# Patient Record
Sex: Male | Born: 1960 | Race: White | Hispanic: No | Marital: Married | Smoking: Current every day smoker
Health system: Southern US, Community
[De-identification: ages and names within clinical notes are randomized; demographics above are authoritative.]

## PROBLEM LIST (undated history)

## (undated) DIAGNOSIS — D696 Thrombocytopenia, unspecified: Secondary | ICD-10-CM

## (undated) DIAGNOSIS — I639 Cerebral infarction, unspecified: Secondary | ICD-10-CM

## (undated) DIAGNOSIS — E119 Type 2 diabetes mellitus without complications: Secondary | ICD-10-CM

## (undated) DIAGNOSIS — I10 Essential (primary) hypertension: Secondary | ICD-10-CM

---

## 2012-01-25 ENCOUNTER — Encounter (HOSPITAL_COMMUNITY): Payer: Self-pay | Admitting: Emergency Medicine

## 2012-01-25 ENCOUNTER — Emergency Department (HOSPITAL_COMMUNITY): Payer: PRIVATE HEALTH INSURANCE

## 2012-01-25 ENCOUNTER — Emergency Department (HOSPITAL_COMMUNITY)
Admission: EM | Admit: 2012-01-25 | Discharge: 2012-01-25 | Disposition: A | Discharge status: Home / Self Care | Payer: PRIVATE HEALTH INSURANCE | Attending: Emergency Medicine | Admitting: Emergency Medicine

## 2012-01-25 DIAGNOSIS — F17200 Nicotine dependence, unspecified, uncomplicated: Secondary | ICD-10-CM | POA: Insufficient documentation

## 2012-01-25 DIAGNOSIS — R079 Chest pain, unspecified: Secondary | ICD-10-CM

## 2012-01-25 DIAGNOSIS — R0789 Other chest pain: Secondary | ICD-10-CM | POA: Insufficient documentation

## 2012-01-25 DIAGNOSIS — IMO0001 Reserved for inherently not codable concepts without codable children: Secondary | ICD-10-CM | POA: Insufficient documentation

## 2012-01-25 LAB — CBC
HCT: 48.5 % (ref 39.0–52.0)
Hemoglobin: 17.2 g/dL — ABNORMAL HIGH (ref 13.0–17.0)
MCH: 31.1 pg (ref 26.0–34.0)
MCV: 87.7 fL (ref 78.0–100.0)
RBC: 5.53 MIL/uL (ref 4.22–5.81)
WBC: 7.1 10*3/uL (ref 4.0–10.5)

## 2012-01-25 LAB — BASIC METABOLIC PANEL
BUN: 9 mg/dL (ref 6–23)
CO2: 26 mEq/L (ref 19–32)
Calcium: 9.4 mg/dL (ref 8.4–10.5)
Chloride: 99 mEq/L (ref 96–112)
Creatinine, Ser: 0.82 mg/dL (ref 0.50–1.35)
Glucose, Bld: 201 mg/dL — ABNORMAL HIGH (ref 70–99)

## 2012-01-25 LAB — POCT I-STAT TROPONIN I: Troponin i, poc: 0 ng/mL (ref 0.00–0.08)

## 2012-01-25 MED ORDER — ASPIRIN 81 MG PO CHEW
324.0000 mg | CHEWABLE_TABLET | Freq: Once | ORAL | Status: AC
  Administered 2012-01-25: 324 mg via ORAL
  Filled 2012-01-25: qty 4

## 2012-01-25 MED ORDER — NITROGLYCERIN 0.4 MG SL SUBL: 0.4000 mg | SUBLINGUAL_TABLET | SUBLINGUAL | Status: DC | PRN

## 2012-01-25 MED ORDER — MORPHINE SULFATE 2 MG/ML IJ SOLN: 2.0000 mg | Freq: Once | INTRAMUSCULAR | Status: DC

## 2012-01-25 NOTE — ED Notes (Signed)
Patient transported to X-ray 

## 2012-01-25 NOTE — ED Notes (Signed)
Pt. Stated, I've had chest pain like a heaviness for 2 weeks. It started in the left and goes to right.

## 2012-01-25 NOTE — ED Provider Notes (Signed)
History     CSN: [id number]  Arrival date & time 01/25/12  1408   First MD Initiated Contact with Patient 01/25/12 1804     Chief Complaint  Patient presents with  . Chest Pain   HPI: Frank Luna is a 51 yo CM with history of tobacco abuse presents with chest pain. Symptoms started two weeks ago. Pain is located diffusely across chest, described as heaviness, initially non-radiating, moderate, gradual onset, no exacerbating factors. Symptoms initially only occurred at night but have become more frequent for the last week. One week ago he had an episode of left arm pain that resolved spontaneously. Symptoms last for a variable period of time. He has taken aspirin intermittently without relief of symptoms. He denies associated SOB, diaphoresis, orthopnea, LE edema, worsening cough, fever or chills. He has never had symptoms like this before. No cardiac history that he is aware of. Only known cardiac risk factors are tobacco abuse.   History reviewed. No pertinent past medical history.  History reviewed. No pertinent past surgical history.  No family history on file.  History  Substance Use Topics  . Smoking status: Current Every Day Smoker  . Smokeless tobacco: Not on file  . Alcohol Use:      Review of Systems  Constitutional: Negative for fever, chills and fatigue.  HENT: Negative for congestion, rhinorrhea and tinnitus.   Eyes: Negative for photophobia and visual disturbance.  Respiratory: Negative for cough, choking and shortness of breath.   Cardiovascular: Positive for chest pain.  Gastrointestinal: Negative for nausea, vomiting, abdominal pain and constipation.  Genitourinary: Negative for dysuria, urgency and decreased urine volume.  Musculoskeletal: Positive for myalgias (left arm pain). Negative for back pain and arthralgias.  Skin: Negative for pallor and rash.  Neurological: Negative for dizziness, speech difficulty, weakness and headaches.  Psychiatric/Behavioral:  Negative for confusion and agitation.  All other systems reviewed and are negative.   Allergies  Penicillins  Home Medications   Current Outpatient Rx  Name  Route  Sig  Dispense  Refill  . ASPIRIN 81 MG PO CHEW   Oral   Chew 81 mg by mouth daily.           BP 133/71  Pulse 84  Temp 98.1 F (36.7 C) (Oral)  Resp 16  Wt 185 lb (83.915 kg)  SpO2 98%  Physical Exam  Nursing note and vitals reviewed. Constitutional: He is oriented to person, place, and time. He appears well-developed and well-nourished. He is cooperative. No distress.  HENT:  Head: Normocephalic and atraumatic.  Mouth/Throat: Oropharynx is clear and moist and mucous membranes are normal.  Eyes: Conjunctivae normal and EOM are normal. Pupils are equal, round, and reactive to light.  Neck: Trachea normal and full passive range of motion without pain. Neck supple. No JVD present.  Cardiovascular: Normal rate, regular rhythm, S1 normal, S2 normal and normal heart sounds.  Exam reveals no decreased pulses.   Pulmonary/Chest: Effort normal and breath sounds normal. He has no decreased breath sounds.  Abdominal: Soft. Normal appearance and bowel sounds are normal. There is no tenderness.  Musculoskeletal: Normal range of motion. He exhibits no edema.  Neurological: He is alert and oriented to person, place, and time.  Skin: Skin is warm and dry.   ED Course  Procedures  Labs Reviewed  CBC - Abnormal; Notable for the following:    Hemoglobin 17.2 (*)     Platelets 100 (*)     All other components within normal  limits  BASIC METABOLIC PANEL - Abnormal; Notable for the following:    Glucose, Bld 201 (*)     All other components within normal limits  POCT I-STAT TROPONIN I  TROPONIN I   Dg Chest 2 View  01/25/2012  *RADIOLOGY REPORT*  Clinical Data: Mid chest pain, cough, chest congestion.  Smoker.  CHEST - 2 VIEW  Comparison: Report dated 07/21/2003.  Findings: Normal sized heart.  Diffuse peribronchial  thickening and accentuation of the interstitial markings.  Mild flattening of the hemidiaphragms.  Mild scoliosis.  IMPRESSION: No acute abnormality.  Changes of COPD and chronic bronchitis.   Original Report Authenticated By: Beckie Salts, M.D.    1. Chest pain    MDM   51 yo CM with history of tobacco abuse presents with chest pain. Afebrile, vital signs stable. Doubt PE as he is low risk by [facility] and PERC negative. CXR without evidence of acute abnormality to explain chest pain. Treated with 324 ASA. Initial troponin negative. ECG without acute ischemia. Felt patient was sufficiently low risk to obtain delta troponin. HEART score 3. Repeat troponin negative as well. Patient was offered admission to CDU for stress test but he declined and stated he would f/u with his PCP on Monday to have stress test arranged. Felt this was reasonable as delta troponin negative, not currently symptomatic, normal vital signs. Stressed importance of continued follow up. Strict return precautions given.   Reviewed imaging, labs and previous medical records, utilized in MDM  Discussed case with Dr. Silverio Lay  ECG: SR, rate 85, non-specific ST changes in leads III, aVF and V6. No prior for comparison.   Clinical Impression 1. Chest pain        Margie Billet, MD 01/26/12 [phone]

## 2012-01-25 NOTE — ED Notes (Signed)
[facility] resident at bedside to discuss POC, per pt request will hold PIV for now

## 2012-01-26 NOTE — ED Provider Notes (Signed)
I have supervised the resident on the management of this patient and agree with the note above. I personally interviewed and examined the patient and my addendum is below.   Frank Luna is a 51 y.o. male smoker here with chest pain. Intermittent chest pain for 2 weeks. Vitals and exam unremarkable. Trop neg x 2. CXR nl. Labs unremarkable. We offered CDU with stress test but he declined. He will f/u outpatient to get a stress test with his doctor.    Richardean Canal, MD 01/26/12 1500

## 2014-09-14 ENCOUNTER — Encounter: Payer: Self-pay | Admitting: *Deleted

## 2016-02-04 ENCOUNTER — Inpatient Hospital Stay (HOSPITAL_COMMUNITY)
Admission: EM | Admit: 2016-02-04 | Discharge: 2016-02-06 | DRG: 063 | Disposition: A | Discharge status: Home / Self Care | Payer: Medicaid Other | Attending: Neurology | Admitting: Neurology

## 2016-02-04 ENCOUNTER — Emergency Department (HOSPITAL_COMMUNITY): Payer: Medicaid Other

## 2016-02-04 ENCOUNTER — Inpatient Hospital Stay (HOSPITAL_COMMUNITY): Payer: Medicaid Other

## 2016-02-04 ENCOUNTER — Encounter (HOSPITAL_COMMUNITY): Payer: Self-pay | Admitting: Emergency Medicine

## 2016-02-04 DIAGNOSIS — E119 Type 2 diabetes mellitus without complications: Secondary | ICD-10-CM | POA: Diagnosis present

## 2016-02-04 DIAGNOSIS — F17200 Nicotine dependence, unspecified, uncomplicated: Secondary | ICD-10-CM | POA: Diagnosis not present

## 2016-02-04 DIAGNOSIS — R7303 Prediabetes: Secondary | ICD-10-CM | POA: Diagnosis present

## 2016-02-04 DIAGNOSIS — I639 Cerebral infarction, unspecified: Secondary | ICD-10-CM | POA: Diagnosis present

## 2016-02-04 DIAGNOSIS — Z8673 Personal history of transient ischemic attack (TIA), and cerebral infarction without residual deficits: Secondary | ICD-10-CM

## 2016-02-04 DIAGNOSIS — I10 Essential (primary) hypertension: Secondary | ICD-10-CM | POA: Diagnosis not present

## 2016-02-04 DIAGNOSIS — R4702 Dysphasia: Secondary | ICD-10-CM | POA: Diagnosis present

## 2016-02-04 DIAGNOSIS — E785 Hyperlipidemia, unspecified: Secondary | ICD-10-CM | POA: Diagnosis present

## 2016-02-04 DIAGNOSIS — R4189 Other symptoms and signs involving cognitive functions and awareness: Secondary | ICD-10-CM | POA: Diagnosis present

## 2016-02-04 DIAGNOSIS — I6350 Cerebral infarction due to unspecified occlusion or stenosis of unspecified cerebral artery: Secondary | ICD-10-CM | POA: Diagnosis not present

## 2016-02-04 DIAGNOSIS — R29703 NIHSS score 3: Secondary | ICD-10-CM | POA: Diagnosis present

## 2016-02-04 DIAGNOSIS — F05 Delirium due to known physiological condition: Secondary | ICD-10-CM

## 2016-02-04 DIAGNOSIS — R4701 Aphasia: Secondary | ICD-10-CM | POA: Diagnosis present

## 2016-02-04 DIAGNOSIS — Z6831 Body mass index (BMI) 31.0-31.9, adult: Secondary | ICD-10-CM | POA: Diagnosis not present

## 2016-02-04 DIAGNOSIS — I63512 Cerebral infarction due to unspecified occlusion or stenosis of left middle cerebral artery: Principal | ICD-10-CM | POA: Diagnosis present

## 2016-02-04 DIAGNOSIS — R479 Unspecified speech disturbances: Secondary | ICD-10-CM | POA: Diagnosis present

## 2016-02-04 DIAGNOSIS — F17210 Nicotine dependence, cigarettes, uncomplicated: Secondary | ICD-10-CM | POA: Diagnosis present

## 2016-02-04 DIAGNOSIS — G4733 Obstructive sleep apnea (adult) (pediatric): Secondary | ICD-10-CM | POA: Diagnosis present

## 2016-02-04 DIAGNOSIS — R4789 Other speech disturbances: Secondary | ICD-10-CM | POA: Diagnosis not present

## 2016-02-04 HISTORY — DX: Essential (primary) hypertension: I10

## 2016-02-04 HISTORY — DX: Cerebral infarction, unspecified: I63.9

## 2016-02-04 LAB — I-STAT CHEM 8, ED
BUN: 13 mg/dL (ref 6–20)
CREATININE: 0.9 mg/dL (ref 0.61–1.24)
Calcium, Ion: 1.19 mmol/L (ref 1.15–1.40)
Chloride: 102 mmol/L (ref 101–111)
GLUCOSE: 106 mg/dL — AB (ref 65–99)
HEMATOCRIT: 49 % (ref 39.0–52.0)
HEMOGLOBIN: 16.7 g/dL (ref 13.0–17.0)
Potassium: 4.4 mmol/L (ref 3.5–5.1)
Sodium: 139 mmol/L (ref 135–145)
TCO2: 28 mmol/L (ref 0–100)

## 2016-02-04 LAB — PROTIME-INR
INR: 0.98
Prothrombin Time: 13 seconds (ref 11.4–15.2)

## 2016-02-04 LAB — APTT: aPTT: 30 seconds (ref 24–36)

## 2016-02-04 LAB — CBC
HEMATOCRIT: 48.8 % (ref 39.0–52.0)
HEMOGLOBIN: 16.9 g/dL (ref 13.0–17.0)
MCH: 30.7 pg (ref 26.0–34.0)
MCHC: 34.6 g/dL (ref 30.0–36.0)
MCV: 88.7 fL (ref 78.0–100.0)
Platelets: 99 10*3/uL — ABNORMAL LOW (ref 150–400)
RBC: 5.5 MIL/uL (ref 4.22–5.81)
RDW: 12.5 % (ref 11.5–15.5)
WBC: 7.2 10*3/uL (ref 4.0–10.5)

## 2016-02-04 LAB — COMPREHENSIVE METABOLIC PANEL
ALT: 34 U/L (ref 17–63)
ANION GAP: 9 (ref 5–15)
AST: 20 U/L (ref 15–41)
Albumin: 4.5 g/dL (ref 3.5–5.0)
Alkaline Phosphatase: 118 U/L (ref 38–126)
BILIRUBIN TOTAL: 0.5 mg/dL (ref 0.3–1.2)
BUN: 12 mg/dL (ref 6–20)
CALCIUM: 9.7 mg/dL (ref 8.9–10.3)
CO2: 27 mmol/L (ref 22–32)
Chloride: 101 mmol/L (ref 101–111)
Creatinine, Ser: 1.01 mg/dL (ref 0.61–1.24)
GFR calc Af Amer: >60 mL/min (ref >60)
Glucose, Bld: 104 mg/dL — ABNORMAL HIGH (ref 65–99)
Potassium: 4 mmol/L (ref 3.5–5.1)
Sodium: 137 mmol/L (ref 135–145)
Total Protein: 7.8 g/dL (ref 6.5–8.1)

## 2016-02-04 LAB — DIFFERENTIAL
Basophils Absolute: 0 10*3/uL (ref 0.0–0.1)
Basophils Relative: 0 %
EOS ABS: 0.3 10*3/uL (ref 0.0–0.7)
EOS PCT: 4 %
LYMPHS ABS: 2.4 10*3/uL (ref 0.7–4.0)
Lymphocytes Relative: 33 %
MONO ABS: 0.6 10*3/uL (ref 0.1–1.0)
MONOS PCT: 9 %
Neutro Abs: 3.9 10*3/uL (ref 1.7–7.7)
Neutrophils Relative %: 54 %

## 2016-02-04 LAB — CBG MONITORING, ED: GLUCOSE-CAPILLARY: 103 mg/dL — AB (ref 65–99)

## 2016-02-04 LAB — ETHANOL: Alcohol, Ethyl (B): <5 mg/dL (ref <5)

## 2016-02-04 LAB — I-STAT TROPONIN, ED: TROPONIN I, POC: 0 ng/mL (ref 0.00–0.08)

## 2016-02-04 LAB — MRSA PCR SCREENING: MRSA by PCR: NEGATIVE

## 2016-02-04 MED ORDER — LABETALOL HCL 5 MG/ML IV SOLN
20.0000 mg | Freq: Once | INTRAVENOUS | Status: AC
  Administered 2016-02-04: 10 mg via INTRAVENOUS

## 2016-02-04 MED ORDER — PANTOPRAZOLE SODIUM 40 MG IV SOLR
40.0000 mg | Freq: Every day | INTRAVENOUS | Status: DC
  Administered 2016-02-04: 40 mg via INTRAVENOUS
  Filled 2016-02-04: qty 40

## 2016-02-04 MED ORDER — ACETAMINOPHEN 325 MG PO TABS
650.0000 mg | ORAL_TABLET | ORAL | Status: DC | PRN
  Administered 2016-02-04: 650 mg via ORAL
  Filled 2016-02-04 (×2): qty 2

## 2016-02-04 MED ORDER — ACETAMINOPHEN 650 MG RE SUPP
650.0000 mg | RECTAL | Status: DC | PRN
  Filled 2016-02-04: qty 1

## 2016-02-04 MED ORDER — LABETALOL HCL 5 MG/ML IV SOLN
INTRAVENOUS | Status: AC
  Filled 2016-02-04: qty 4

## 2016-02-04 MED ORDER — ACETAMINOPHEN 160 MG/5ML PO SOLN: 650.0000 mg | ORAL | Status: DC | PRN

## 2016-02-04 MED ORDER — ALTEPLASE (STROKE) FULL DOSE INFUSION
0.9000 mg/kg | Freq: Once | INTRAVENOUS | Status: AC
  Administered 2016-02-04: 75.5 mg via INTRAVENOUS
  Filled 2016-02-04: qty 100

## 2016-02-04 MED ORDER — SODIUM CHLORIDE 0.9 % IV SOLN
50.0000 mL | Freq: Once | INTRAVENOUS | Status: AC
  Administered 2016-02-04: 90 mL via INTRAVENOUS

## 2016-02-04 MED ORDER — SODIUM CHLORIDE 0.9 % IV SOLN
INTRAVENOUS | Status: DC
  Administered 2016-02-04 – 2016-02-05 (×2): via INTRAVENOUS

## 2016-02-04 MED ORDER — NICARDIPINE HCL IN NACL 20-0.86 MG/200ML-% IV SOLN: 5.0000 mg/h | INTRAVENOUS | Status: DC

## 2016-02-04 MED ORDER — ALTEPLASE 100 MG IV SOLR
INTRAVENOUS | Status: AC
  Filled 2016-02-04: qty 100

## 2016-02-04 MED ORDER — IOPAMIDOL (ISOVUE-370) INJECTION 76%
100.0000 mL | Freq: Once | INTRAVENOUS | Status: AC | PRN
  Administered 2016-02-04: 100 mL via INTRAVENOUS

## 2016-02-04 MED ORDER — STROKE: EARLY STAGES OF RECOVERY BOOK
Freq: Once | Status: AC
  Administered 2016-02-04: 18:00:00
  Filled 2016-02-04: qty 1

## 2016-02-04 MED ORDER — SENNOSIDES-DOCUSATE SODIUM 8.6-50 MG PO TABS: 1.0000 | ORAL_TABLET | Freq: Every evening | ORAL | Status: DC | PRN

## 2016-02-04 NOTE — Consult Note (Signed)
Referring Physician:  Dr Alvino Chapel at [facility]   Chief Complaint:  Stroke s/p iv thrombolysis  HPI:                                                                                                                                         Frank Luna is an 55 y.o. 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 Patient initially presented at [facility] due to acute onset language impairment and confusion. He was with his wife at the store and was noted to have difficulty expressing himself. Had NIHSS 3, CT head without acute abnormality, was considered a suitable candidate for treatment with iv thrombolysis and managed accordingly. A subsequent CTA brain showed no LVO. Upon arrival to the NICU he was still with slight dysphasia, NIHSS 1. Presently, denies HA, vertigo, double vision, difficulty swallowing, focal weakness or numbness, or visual disturbances.   Date last known well: 02/04/16 Time last known well: 1330 tPA Given: yes NIHSS: 3 MRS: 0  Past Medical History:  Diagnosis Date  . Hypertension   . Stroke [facility])     No past surgical history on file.  No family history on file. Social History:  reports that he has been smoking.  He has never used smokeless tobacco. He reports that he does not drink alcohol or use drugs.  Allergies:  Allergies  Allergen Reactions  . Penicillins     Unknown    Medications:                                                                                                                           I have reviewed the patient's current medications.  ROS:  History obtained from chart review and the patient  General ROS: negative for -  chills, fatigue, fever, night sweats, weight gain or weight loss Psychological ROS: negative for - behavioral disorder, hallucinations, memory difficulties, mood swings or suicidal ideation Ophthalmic ROS: negative for - blurry vision, double vision, eye pain or loss of vision ENT ROS: negative for - epistaxis, nasal discharge, oral lesions, sore throat, tinnitus or vertigo Allergy and Immunology ROS: negative for - hives or itchy/watery eyes Hematological and Lymphatic ROS: negative for - bleeding problems, bruising or swollen lymph nodes Endocrine ROS: negative for - galactorrhea, hair pattern changes, polydipsia/polyuria or temperature intolerance Respiratory ROS: negative for - cough, hemoptysis, shortness of breath or wheezing Cardiovascular ROS: negative for - chest pain, dyspnea on exertion, edema or irregular heartbeat Gastrointestinal ROS: negative for - abdominal pain, diarrhea, hematemesis, nausea/vomiting or stool incontinence Genito-Urinary ROS: negative for - dysuria, hematuria, incontinence or urinary frequency/urgency Musculoskeletal ROS: negative for - joint swelling or muscular weakness Neurological ROS: as noted in HPI Dermatological ROS: negative for rash and skin lesion changes    Physical exam: Blood pressure (!) 173/102, pulse 70, temperature 98.3 F (36.8 C), temperature source Oral, resp. rate 13, height _0  (1.778 m), weight 83.9 kg (185 lb), SpO2 96 %. Constitutional: well developed, pleasant male in no apparent distress. Eyes: no jaundice or exophthalmos.  Head: normocephalic. Neck: supple, no bruits, no JVD. Cardiac: no murmurs. Lungs: clear. Abdomen: soft, no tender, no mass. Extremities: no edema, clubbing, or cyanosis.  Skin: no rash Neurologic Examination:                                                                                                      General: NAD Mental Status: Alert, oriented, thought content appropriate. Mild expressive  dysphasia. Able to follow 3 step commands without difficulty. Cranial Nerves: II:  Visual fields grossly normal, pupils equal, round, reactive to light and accommodation III,IV, VI: ptosis not present, extra-ocular motions intact bilaterally V,VII: smile symmetric, facial light touch sensation normal bilaterally VIII: hearing normal bilaterally IX,X: uvula rises symmetrically XI: bilateral shoulder shrug XII: midline tongue extension without atrophy or fasciculations  Motor: Right : Upper extremity   5/5    Left:     Upper extremity   5/5  Lower extremity   5/5     Lower extremity   5/5 Tone and bulk:normal tone throughout; no atrophy noted Sensory: Pinprick and light touch intact throughout, bilaterally Deep Tendon Reflexes:  Right: Upper Extremity   Left: Upper extremity   biceps (C-5 to C-6) 2/4   biceps (C-5 to C-6) 2/4 tricep (C7) 2/4    triceps (C7) 2/4 Brachioradialis (C6) 2/4  Brachioradialis (C6) 2/4  Lower Extremity Lower Extremity  quadriceps (L-2 to L-4) 2/4   quadriceps (L-2 to L-4) 2/4 Achilles (S1) 2/4   Achilles (S1) 2/4  Plantars: Right: downgoing   Left: downgoing Cerebellar: normal finger-to-nose,  normal heel-to-shin test Gait:  No tested due to multiple leads    Results for orders placed or performed during the hospital encounter of 02/04/16 (from  the past 48 hour(s))  CBG monitoring, ED     Status: Abnormal   Collection Time: 02/04/16  2:27 PM  Result Value Ref Range   Glucose-Capillary 103 (H) 65 - 99 mg/dL  Ethanol     Status: None   Collection Time: 02/04/16  2:29 PM  Result Value Ref Range   Alcohol, Ethyl (B) <5 <5 mg/dL    Comment:        LOWEST DETECTABLE LIMIT FOR SERUM ALCOHOL IS 5 mg/dL FOR MEDICAL PURPOSES ONLY   Protime-INR     Status: None   Collection Time: 02/04/16  2:29 PM  Result Value Ref Range   Prothrombin Time 13.0 11.4 - 15.2 seconds   INR 0.98   APTT     Status: None   Collection Time: 02/04/16  2:29 PM  Result Value  Ref Range   aPTT 30 24 - 36 seconds  CBC     Status: Abnormal   Collection Time: 02/04/16  2:29 PM  Result Value Ref Range   WBC 7.2 4.0 - 10.5 K/uL   RBC 5.50 4.22 - 5.81 MIL/uL   Hemoglobin 16.9 13.0 - 17.0 g/dL   HCT 48.8 39.0 - 52.0 %   MCV 88.7 78.0 - 100.0 fL   MCH 30.7 26.0 - 34.0 pg   MCHC 34.6 30.0 - 36.0 g/dL   RDW 12.5 11.5 - 15.5 %   Platelets 99 (L) 150 - 400 K/uL    Comment: SPECIMEN CHECKED FOR CLOTS PLATELET COUNT CONFIRMED BY SMEAR   Differential     Status: None   Collection Time: 02/04/16  2:29 PM  Result Value Ref Range   Neutrophils Relative % 54 %   Neutro Abs 3.9 1.7 - 7.7 K/uL   Lymphocytes Relative 33 %   Lymphs Abs 2.4 0.7 - 4.0 K/uL   Monocytes Relative 9 %   Monocytes Absolute 0.6 0.1 - 1.0 K/uL   Eosinophils Relative 4 %   Eosinophils Absolute 0.3 0.0 - 0.7 K/uL   Basophils Relative 0 %   Basophils Absolute 0.0 0.0 - 0.1 K/uL  Comprehensive metabolic panel     Status: Abnormal   Collection Time: 02/04/16  2:29 PM  Result Value Ref Range   Sodium 137 135 - 145 mmol/L   Potassium 4.0 3.5 - 5.1 mmol/L   Chloride 101 101 - 111 mmol/L   CO2 27 22 - 32 mmol/L   Glucose, Bld 104 (H) 65 - 99 mg/dL   BUN 12 6 - 20 mg/dL   Creatinine, Ser 1.01 0.61 - 1.24 mg/dL   Calcium 9.7 8.9 - 10.3 mg/dL   Total Protein 7.8 6.5 - 8.1 g/dL   Albumin 4.5 3.5 - 5.0 g/dL   AST 20 15 - 41 U/L   ALT 34 17 - 63 U/L   Alkaline Phosphatase 118 38 - 126 U/L   Total Bilirubin 0.5 0.3 - 1.2 mg/dL   GFR calc non Af Amer >60 >60 mL/min   GFR calc Af Amer >60 >60 mL/min    Comment: (NOTE) The eGFR has been calculated using the CKD EPI equation. This calculation has not been validated in all clinical situations. eGFR's persistently <60 mL/min signify possible Chronic Kidney Disease.    Anion gap 9 5 - 15  I-stat troponin, ED (not at [facility], [facility])     Status: None   Collection Time: 02/04/16  2:35 PM  Result Value Ref Range   Troponin i, poc 0.00 0.00 - 0.08 ng/mL  Comment 3            Comment: Due to the release kinetics of cTnI, a negative result within the first hours of the onset of symptoms does not rule out myocardial infarction with certainty. If myocardial infarction is still suspected, repeat the test at appropriate intervals.   I-Stat Chem 8, ED  (not at [facility], [facility])     Status: Abnormal   Collection Time: 02/04/16  2:37 PM  Result Value Ref Range   Sodium 139 135 - 145 mmol/L   Potassium 4.4 3.5 - 5.1 mmol/L   Chloride 102 101 - 111 mmol/L   BUN 13 6 - 20 mg/dL   Creatinine, Ser 0.90 0.61 - 1.24 mg/dL   Glucose, Bld 106 (H) 65 - 99 mg/dL   Calcium, Ion 1.19 1.15 - 1.40 mmol/L   TCO2 28 0 - 100 mmol/L   Hemoglobin 16.7 13.0 - 17.0 g/dL   HCT 49.0 39.0 - 52.0 %   Ct Angio Head W Or Wo Contrast  Result Date: 02/04/2016 CLINICAL DATA:  Aphasia.  Sudden onset confusion. EXAM: CT ANGIOGRAPHY HEAD AND NECK TECHNIQUE: Multidetector CT imaging of the head and neck was performed using the standard protocol during bolus administration of intravenous contrast. Multiplanar CT image reconstructions and MIPs were obtained to evaluate the vascular anatomy. Carotid stenosis measurements (when applicable) are obtained utilizing NASCET criteria, using the distal internal carotid diameter as the denominator. CONTRAST:  100 mL Isovue 370 COMPARISON:  None. FINDINGS: CTA NECK FINDINGS Aortic arch: Standard branching. Arch vessel origins are widely patent. Right carotid system: Patent without evidence of stenosis or dissection. Mild calcified and noncalcified plaque about the carotid bifurcation. Left carotid system: Patent without evidence of stenosis or dissection. Mild calcified and noncalcified plaque about the carotid bifurcation. Vertebral arteries: Patent with both vertebral arteries being somewhat small in size. No evidence of stenosis or dissection. Skeleton: Mild-to-moderate C5-6 and C6-7 disc degeneration. Other neck: Bilateral mastoid effusions. Mild  mucosal thickening and small volume secretions in the right maxillary sinus, with mild scattered mucosal thickening in the paranasal sinuses elsewhere. Upper chest: Clear lung apices. Review of the MIP images confirms the above findings CTA HEAD FINDINGS Anterior circulation: The internal carotid arteries are patent from skullbase to carotid termini with mild siphon atherosclerosis but no significant stenosis. MCAs and ACAs are patent with moderate branch vessel atherosclerotic type changes but no evidence of major branch occlusion or significant proximal stenosis. There are mild-to-moderate right and moderate left mid A2 segment stenoses. No aneurysm. Posterior circulation: The right vertebral artery ends in PICA. The left vertebral artery supplies the proximal basilar. AICA origins are grossly patent. 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 SCA origins are patent. There is a patent left posterior communicating artery. PCAs are patent with moderate branch vessel irregularity but no significant proximal stenosis. No aneurysm. Venous sinuses: Patent. Anatomic variants: Persistent right trigeminal artery. Delayed phase: No abnormal enhancement. Review of the MIP images confirms the above findings IMPRESSION: 1. No major intracranial vessel occlusion or flow limiting proximal stenosis. Moderate branch vessel atherosclerotic changes. 2. Persistent right trigeminal artery, a developmental variant. 3. Mild cervical carotid artery atherosclerosis without stenosis. Electronically Signed   By: Logan Bores M.D.   On: 02/04/2016 16:09   Ct Angio Neck W And/or Wo Contrast  Result Date: 02/04/2016 CLINICAL  DATA:  Aphasia.  Sudden onset confusion. EXAM: CT ANGIOGRAPHY HEAD AND NECK TECHNIQUE: Multidetector CT imaging of the head and neck was performed using the standard  protocol during bolus administration of intravenous contrast. Multiplanar CT image reconstructions and MIPs were obtained to evaluate the vascular anatomy. Carotid stenosis measurements (when applicable) are obtained utilizing NASCET criteria, using the distal internal carotid diameter as the denominator. CONTRAST:  100 mL Isovue 370 COMPARISON:  None. FINDINGS: CTA NECK FINDINGS Aortic arch: Standard branching. Arch vessel origins are widely patent. Right carotid system: Patent without evidence of stenosis or dissection. Mild calcified and noncalcified plaque about the carotid bifurcation. Left carotid system: Patent without evidence of stenosis or dissection. Mild calcified and noncalcified plaque about the carotid bifurcation. Vertebral arteries: Patent with both vertebral arteries being somewhat small in size. No evidence of stenosis or dissection. Skeleton: Mild-to-moderate C5-6 and C6-7 disc degeneration. Other neck: Bilateral mastoid effusions. Mild mucosal thickening and small volume secretions in the right maxillary sinus, with mild scattered mucosal thickening in the paranasal sinuses elsewhere. Upper chest: Clear lung apices. Review of the MIP images confirms the above findings CTA HEAD FINDINGS Anterior circulation: The internal carotid arteries are patent from skullbase to carotid termini with mild siphon atherosclerosis but no significant stenosis. MCAs and ACAs are patent with moderate branch vessel atherosclerotic type changes but no evidence of major branch occlusion or significant proximal stenosis. There are mild-to-moderate right and moderate left mid A2 segment stenoses. No aneurysm. Posterior circulation: The right vertebral artery ends in PICA. The left vertebral artery supplies the proximal basilar. AICA origins are grossly patent. 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 SCA origins are patent. There is a patent left posterior communicating artery. PCAs are patent with moderate branch vessel irregularity but no significant proximal stenosis. No aneurysm. Venous sinuses: Patent. Anatomic variants: Persistent right trigeminal artery. Delayed phase: No abnormal enhancement. Review of the MIP images confirms the above findings IMPRESSION: 1. No major intracranial vessel occlusion or flow limiting proximal stenosis. Moderate branch vessel atherosclerotic changes. 2. Persistent right trigeminal artery, a developmental variant. 3. Mild cervical carotid artery atherosclerosis without stenosis. Electronically Signed   By: Logan Bores M.D.   On: 02/04/2016 16:09   Ct Head Code Stroke W/o Cm  Result Date: 02/04/2016 CLINICAL DATA:  Code stroke. Sudden onset of confusion and speech disturbance, 1 hour duration. EXAM: CT HEAD WITHOUT CONTRAST TECHNIQUE: Contiguous axial images were obtained from the base of the skull through the vertex without intravenous contrast. COMPARISON:  None. FINDINGS: Brain: Mild generalized brain atrophy. Chronic small-vessel changes of the cerebral hemispheric white matter. Old lacunar infarctions left basal ganglia. No sign of acute brain infarction by CT at this time. No mass lesion, hemorrhage, hydrocephalus or extra-axial collection. Vascular: Questionable hyperdense left MCA branches in the insular region. Focal calcified plaque at the supraclinoid ICA on the left. Skull: Negative Sinuses/Orbits: Mucosal thickening of the maxillary sinuses right more than left. Small mastoid effusions. Other: None significant ASPECTS ([place] Stroke Program Early CT Score) - Ganglionic level infarction (caudate, lentiform nuclei, internal capsule, insula, M1-M3 cortex): 7 - Supraganglionic infarction (M4-M6 cortex): 3 Total score (0-10 with 10 being normal): 10 IMPRESSION: 1. Atrophy and chronic small-vessel ischemic changes including old  lacunar infarctions left basal ganglia. Question few hyperdense left MCA branches in the insular region, not definite.  No parenchymal findings of acute infarction. No hemorrhage. 2. ASPECTS is 10 These results were called by telephone at the time of interpretation on 02/04/2016 at 2:45 pm to Dr. Davonna Belling , who verbally acknowledged these results. Electronically Signed   By: Nelson Chimes M.D.   On: 02/04/2016 14:49     Assessment: 55 y.o. male with acute motor dysphasia treated with iv alteplase. CTA brain showed no LVO. Likely small acute infarction distal branches left MCA. Initial NIHSS 3, with current NIHSS 1 post thrombolysis.  Admitted to NICU. Follow post iv tPA protocol. Stroke team will resume care tomorrow.  Stroke Risk Factors - hypertension, smoking and stroke  Plan: 1. HgbA1c, fasting lipid panel 2. MRI of the brain without contrast 3. Echocardiogram 4. Carotid dopplers 5. Prophylactic therapy-aspirin as per post iv tPA protocol 6. Risk factor modification 7. Telemetry monitorinG 8. Frequent neuro checks 9. PT/OT SLP  Dorian Pod, MD [facility] [phone]  02/04/2016, 5:09 PM

## 2016-02-04 NOTE — Progress Notes (Signed)
Patient arrived to unit from [facility] via EMS and [facility] RN. Patient exhibited slurred speech and moderate aphasia. Dr. Cyril Mourningamillo notified immediately and arrived to bedside. Will continue to monitor.

## 2016-02-04 NOTE — ED Notes (Signed)
Per neurologist total ateplase  75.5 mg 7.6 bolus over 1 min 67.9 over 1 hour Waste 24.5

## 2016-02-04 NOTE — ED Notes (Signed)
cbg of 103.

## 2016-02-04 NOTE — Progress Notes (Signed)
17:15 Patients aphasia worsened. MD paged, responded 17:30 and stat CT head ordered.  Patient has wax and waned with aphasia. Patient also reported h/a. Wife states pt has had h/a x 3 days. Tylenol was given. MD aware. CT done.

## 2016-02-04 NOTE — ED Provider Notes (Signed)
[facility]-EMERGENCY DEPT Provider Note   CSN: [id number] Arrival date & time: 02/04/16  1419     History   Chief Complaint Chief Complaint  Patient presents with  . Code Stroke  Level V caveat due to mild confusion and difficulty speaking.HPI Frank Luna is a 55 y.o. male.  HPI Patient presented with onset of difficulty speaking at around 1:30 day. Was driving with wife and began to not be able to get words out. Previous history of a stroke. Had had some mild improvement still having symptoms. No headache. Maybe had some difficulty using his right side also. He has had no bleeding. Previous history of strokes was reportedly nonischemic. Code stroke called upon initial evaluation.   Past Medical History:  Diagnosis Date  . Hypertension   . Stroke [facility])     Patient Active Problem List   Diagnosis Date Noted  . Acute ischemic stroke (HCC) 02/04/2016    No past surgical history on file.     Home Medications    Prior to Admission medications   Medication Sig Start Date End Date Taking? Authorizing Provider  aspirin 81 MG chewable tablet Chew 81 mg by mouth daily.    Historical Provider, MD    Family History No family history on file.  Social History Social History  Substance Use Topics  . Smoking status: Current Every Day Smoker  . Smokeless tobacco: Never Used  . Alcohol use No     Allergies   Penicillins   Review of Systems Review of Systems  Unable to perform ROS: Mental status change     Physical Exam Updated Vital Signs BP 173/80 (BP Location: Right Arm)   Pulse 77   Temp 98.3 F (36.8 C) (Oral)   Resp 12   Ht 5\' 10"  (1.778 m)   Wt 185 lb (83.9 kg)   SpO2 100%   BMI 26.54 kg/m   Physical Exam  Constitutional: He appears well-developed.  HENT:  Head: Atraumatic.  Eyes: EOM are normal.  Neck: Neck supple.  Cardiovascular: Normal rate.   Pulmonary/Chest: Effort normal.  Abdominal: Soft.  Musculoskeletal: He exhibits edema.    Neurological: He is alert.  Some difficulty speaking. Some weakness on right hand grip compared to left. Decreased plantar flexion on right foot compared to left. Mild right-sided facial droop.  . Eye movements intact. Some mild confusion. When asked to touch his nose and races hand he touched the side of his cheek and did not raise his hand but he did repeat "touched my nose and raise my hand." Complete NIH scoring done by specialist on-call neurology and was 9  Skin: Skin is warm. Capillary refill takes less than 2 seconds.     ED Treatments / Results  Labs (all labs ordered are listed, but only abnormal results are displayed) Labs Reviewed  CBC - Abnormal; Notable for the following:       Result Value   Platelets 99 (*)    All other components within normal limits  COMPREHENSIVE METABOLIC PANEL - Abnormal; Notable for the following:    Glucose, Bld 104 (*)    All other components within normal limits  I-STAT CHEM 8, ED - Abnormal; Notable for the following:    Glucose, Bld 106 (*)    All other components within normal limits  CBG MONITORING, ED - Abnormal; Notable for the following:    Glucose-Capillary 103 (*)    All other components within normal limits  ETHANOL  PROTIME-INR  APTT  DIFFERENTIAL  RAPID URINE DRUG SCREEN, HOSP PERFORMED  URINALYSIS, ROUTINE W REFLEX MICROSCOPIC  I-STAT TROPOININ, ED    EKG  EKG Interpretation  Date/Time:  Sunday February 04 2016 14:27:38 EST Ventricular Rate:  70 PR Interval:    QRS Duration: 109 QT Interval:  418 QTC Calculation: 451 R Axis:   92 Text Interpretation:  Sinus rhythm Short PR interval Borderline right axis deviation Confirmed by Rubin PayorPICKERING  MD, Yamilee Harmes [phone]([id number]) on 02/04/2016 2:35:08 PM       Radiology Ct Angio Head W Or Wo Contrast  Result Date: 02/04/2016 CLINICAL DATA:  Aphasia.  Sudden onset confusion. EXAM: CT ANGIOGRAPHY HEAD AND NECK TECHNIQUE: Multidetector CT imaging of the head and neck was performed using  the standard protocol during bolus administration of intravenous contrast. Multiplanar CT image reconstructions and MIPs were obtained to evaluate the vascular anatomy. Carotid stenosis measurements (when applicable) are obtained utilizing NASCET criteria, using the distal internal carotid diameter as the denominator. CONTRAST:  100 mL Isovue 370 COMPARISON:  None. FINDINGS: CTA NECK FINDINGS Aortic arch: Standard branching. Arch vessel origins are widely patent. Right carotid system: Patent without evidence of stenosis or dissection. Mild calcified and noncalcified plaque about the carotid bifurcation. Left carotid system: Patent without evidence of stenosis or dissection. Mild calcified and noncalcified plaque about the carotid bifurcation. Vertebral arteries: Patent with both vertebral arteries being somewhat small in size. No evidence of stenosis or dissection. Skeleton: Mild-to-moderate C5-6 and C6-7 disc degeneration. Other neck: Bilateral mastoid effusions. Mild mucosal thickening and small volume secretions in the right maxillary sinus, with mild scattered mucosal thickening in the paranasal sinuses elsewhere. Upper chest: Clear lung apices. Review of the MIP images confirms the above findings CTA HEAD FINDINGS Anterior circulation: The internal carotid arteries are patent from skullbase to carotid termini with mild siphon atherosclerosis but no significant stenosis. MCAs and ACAs are patent with moderate branch vessel atherosclerotic type changes but no evidence of major branch occlusion or significant proximal stenosis. There are mild-to-moderate right and moderate left mid A2 segment stenoses. No aneurysm. Posterior circulation: The right vertebral artery ends in PICA. The left vertebral artery supplies the proximal basilar. AICA origins are grossly patent. 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 SCA origins are patent. There is a patent left posterior communicating artery. PCAs are patent with moderate branch vessel irregularity but no significant proximal stenosis. No aneurysm. Venous sinuses: Patent. Anatomic variants: Persistent right trigeminal artery. Delayed phase: No abnormal enhancement. Review of the MIP images confirms the above findings IMPRESSION: 1. No major intracranial vessel occlusion or flow limiting proximal stenosis. Moderate branch vessel atherosclerotic changes. 2. Persistent right trigeminal artery, a developmental variant. 3. Mild cervical carotid artery atherosclerosis without stenosis. Electronically Signed   By: Sebastian AcheAllen  Grady M.D.   On: 02/04/2016 16:09   Ct Angio Neck W And/or Wo Contrast  Result Date: 02/04/2016 CLINICAL DATA:  Aphasia.  Sudden onset confusion. EXAM: CT ANGIOGRAPHY HEAD AND NECK TECHNIQUE: Multidetector CT imaging of the head and neck was performed using the standard protocol during bolus administration of intravenous contrast. Multiplanar CT image reconstructions and MIPs were obtained to evaluate the vascular anatomy. Carotid stenosis measurements (when applicable) are obtained utilizing NASCET criteria, using the distal internal carotid diameter as the denominator. CONTRAST:  100 mL Isovue 370 COMPARISON:  None. FINDINGS: CTA NECK FINDINGS Aortic arch: Standard  branching. Arch vessel origins are widely patent. Right carotid system: Patent without evidence of stenosis or dissection. Mild calcified and noncalcified plaque about the carotid bifurcation. Left carotid system: Patent without evidence of stenosis or dissection. Mild calcified and noncalcified plaque about the carotid bifurcation. Vertebral arteries: Patent with both vertebral arteries being somewhat small in size. No evidence of stenosis or dissection. Skeleton: Mild-to-moderate C5-6 and C6-7 disc degeneration. Other neck: Bilateral mastoid  effusions. Mild mucosal thickening and small volume secretions in the right maxillary sinus, with mild scattered mucosal thickening in the paranasal sinuses elsewhere. Upper chest: Clear lung apices. Review of the MIP images confirms the above findings CTA HEAD FINDINGS Anterior circulation: The internal carotid arteries are patent from skullbase to carotid termini with mild siphon atherosclerosis but no significant stenosis. MCAs and ACAs are patent with moderate branch vessel atherosclerotic type changes but no evidence of major branch occlusion or significant proximal stenosis. There are mild-to-moderate right and moderate left mid A2 segment stenoses. No aneurysm. Posterior circulation: The right vertebral artery ends in PICA. The left vertebral artery supplies the proximal basilar. AICA origins are grossly patent. 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 SCA origins are patent. There is a patent left posterior communicating artery. PCAs are patent with moderate branch vessel irregularity but no significant proximal stenosis. No aneurysm. Venous sinuses: Patent. Anatomic variants: Persistent right trigeminal artery. Delayed phase: No abnormal enhancement. Review of the MIP images confirms the above findings IMPRESSION: 1. No major intracranial vessel occlusion or flow limiting proximal stenosis. Moderate branch vessel atherosclerotic changes. 2. Persistent right trigeminal artery, a developmental variant. 3. Mild cervical carotid artery atherosclerosis without stenosis. Electronically Signed   By: Sebastian AcheAllen  Grady M.D.   On: 02/04/2016 16:09   Ct Head Code Stroke W/o Cm  Result Date: 02/04/2016 CLINICAL DATA:  Code stroke. Sudden onset of confusion and speech disturbance, 1 hour duration. EXAM: CT HEAD WITHOUT CONTRAST TECHNIQUE: Contiguous axial images were  obtained from the base of the skull through the vertex without intravenous contrast. COMPARISON:  None. FINDINGS: Brain: Mild generalized brain atrophy. Chronic small-vessel changes of the cerebral hemispheric white matter. Old lacunar infarctions left basal ganglia. No sign of acute brain infarction by CT at this time. No mass lesion, hemorrhage, hydrocephalus or extra-axial collection. Vascular: Questionable hyperdense left MCA branches in the insular region. Focal calcified plaque at the supraclinoid ICA on the left. Skull: Negative Sinuses/Orbits: Mucosal thickening of the maxillary sinuses right more than left. Small mastoid effusions. Other: None significant ASPECTS (Alberta Stroke Program Early CT Score) - Ganglionic level infarction (caudate, lentiform nuclei, internal capsule, insula, M1-M3 cortex): 7 - Supraganglionic infarction (M4-M6 cortex): 3 Total score (0-10 with 10 being normal): 10 IMPRESSION: 1. Atrophy and chronic small-vessel ischemic changes including old lacunar infarctions left basal ganglia. Question few hyperdense left MCA branches in the insular region, not definite. No parenchymal findings of acute infarction. No hemorrhage. 2. ASPECTS is 10 These results were called by telephone at the time of interpretation on 02/04/2016 at 2:45 pm to Dr. Benjiman CoreNATHAN Darcus Edds , who verbally acknowledged these results. Electronically Signed   By: Paulina FusiMark  Shogry M.D.   On: 02/04/2016 14:49    Procedures Procedures (including critical care time)  Medications Ordered in ED Medications  alteplase (ACTIVASE) 1 mg/mL infusion 76 mg (75.5 mg Intravenous New Bag/Given 02/04/16 1516)  Followed by  0.9 %  sodium chloride infusion (90 mLs Intravenous New Bag/Given 02/04/16 1526)  iopamidol (ISOVUE-370) 76 % injection 100 mL (100 mLs Intravenous Contrast Given 02/04/16 1543)     Initial Impression / Assessment and Plan / ED Course  I have reviewed the triage vital signs and the nursing  notes.  Pertinent labs & imaging results that were available during my care of the patient were reviewed by me and considered in my medical decision making (see chart for details).  Clinical Course     Patient with likely stroke. Specialist on-call saw and recommended TPA. Discussed with patient and his wife. Has had previous stroke a year ago. TPA was recommended and patient and his wife agreed. Discussed with Dr. Leroy Kennedy who accepted the patient transfer. NIH score of 9 with no contraindications to TPA. Blood pressure does not need emergent control. Transfer to [facility]. [facility] transfer the ER while awaiting ICU bed. Discussed with Dr. Preston Fleeting and Dr. Leroy Kennedy.  Final Clinical Impressions(s) / ED Diagnoses   Final diagnoses:  Acute ischemic stroke (HCC)   CRITICAL CARE Performed by: Billee Cashing Total critical care time: Critical care time was exclusive of separately billable procedures and treating other patients. Critical care was necessary to treat or prevent imminent or life-threatening deterioration. Critical care was time spent personally by me on the following activities: development of treatment plan with patient and/or surrogate as well as nursing, discussions with consultants, evaluation of patient's response to treatment, examination of patient, obtaining history from patient or surrogate, ordering and performing treatments and interventions, ordering and review of laboratory studies, ordering and review of radiographic studies, pulse oximetry and re-evaluation of patient's condition.  New Prescriptions New Prescriptions   No medications on file     Benjiman Core, MD 02/04/16 1623

## 2016-02-04 NOTE — ED Notes (Signed)
[facility] . Accompanied by Harmon Pier Vogler, RN

## 2016-02-04 NOTE — ED Notes (Signed)
Paged code stroke at 14:29; called SOC at 14:33

## 2016-02-04 NOTE — Progress Notes (Signed)
eLink Physician-Brief Progress Note Patient Name: Paulino RilyDanny Kreft DOB: [birth year] MRN: [id number]   Date of Service  02/04/2016  HPI/Events of Note  CVA admission, stable  eICU Interventions  Per neurology      Intervention Category Evaluation Type: New Patient Evaluation  Shan Levansatrick Wright 02/04/2016, 5:19 PM

## 2016-02-04 NOTE — ED Triage Notes (Addendum)
[id number] wife reports sudden onset confusion and trouble speaking. Pt alert to person upon ED arrival.Speech normal. Following commands. Moving all extremities x 4 equally. Denies ha at present. EDP at beside.

## 2016-02-04 NOTE — Progress Notes (Signed)
Patient vomited, MD aware. Patient now states he "feels better". No acute change in NIH. Patient continues to wax and wane with aphasia between mild/moderate vs. Severe (difficulty identifying objects on page, perseverating). Will continue to monitor.

## 2016-02-05 ENCOUNTER — Encounter (HOSPITAL_COMMUNITY): Payer: Self-pay | Admitting: Certified Registered Nurse Anesthetist

## 2016-02-05 ENCOUNTER — Inpatient Hospital Stay (HOSPITAL_COMMUNITY): Payer: Medicaid Other

## 2016-02-05 DIAGNOSIS — F17200 Nicotine dependence, unspecified, uncomplicated: Secondary | ICD-10-CM | POA: Diagnosis present

## 2016-02-05 DIAGNOSIS — F05 Delirium due to known physiological condition: Secondary | ICD-10-CM

## 2016-02-05 DIAGNOSIS — I10 Essential (primary) hypertension: Secondary | ICD-10-CM | POA: Diagnosis present

## 2016-02-05 DIAGNOSIS — R4789 Other speech disturbances: Secondary | ICD-10-CM

## 2016-02-05 DIAGNOSIS — R479 Unspecified speech disturbances: Secondary | ICD-10-CM | POA: Diagnosis present

## 2016-02-05 DIAGNOSIS — I6350 Cerebral infarction due to unspecified occlusion or stenosis of unspecified cerebral artery: Secondary | ICD-10-CM

## 2016-02-05 LAB — URINALYSIS, ROUTINE W REFLEX MICROSCOPIC
Bilirubin Urine: NEGATIVE
GLUCOSE, UA: NEGATIVE mg/dL
HGB URINE DIPSTICK: NEGATIVE
Ketones, ur: NEGATIVE mg/dL
Leukocytes, UA: NEGATIVE
Nitrite: NEGATIVE
Protein, ur: NEGATIVE mg/dL
SPECIFIC GRAVITY, URINE: 1.014 (ref 1.005–1.030)
pH: 6 (ref 5.0–8.0)

## 2016-02-05 LAB — LIPID PANEL
CHOLESTEROL: 156 mg/dL (ref 0–200)
HDL: 30 mg/dL — AB (ref >40)
LDL Cholesterol: 98 mg/dL (ref 0–99)
TRIGLYCERIDES: 138 mg/dL (ref <150)
Total CHOL/HDL Ratio: 5.2 RATIO
VLDL: 28 mg/dL (ref 0–40)

## 2016-02-05 LAB — RAPID URINE DRUG SCREEN, HOSP PERFORMED
Amphetamines: NOT DETECTED
BENZODIAZEPINES: NOT DETECTED
Barbiturates: NOT DETECTED
COCAINE: NOT DETECTED
Opiates: NOT DETECTED
Tetrahydrocannabinol: NOT DETECTED

## 2016-02-05 LAB — ECHOCARDIOGRAM COMPLETE
Height: 67 in
Weight: 3206.37 oz

## 2016-02-05 MED ORDER — ATORVASTATIN CALCIUM 40 MG PO TABS
40.0000 mg | ORAL_TABLET | Freq: Every day | ORAL | Status: DC
  Administered 2016-02-05: 40 mg via ORAL
  Filled 2016-02-05: qty 1

## 2016-02-05 MED ORDER — METOPROLOL SUCCINATE ER 50 MG PO TB24
50.0000 mg | ORAL_TABLET | Freq: Every day | ORAL | Status: DC
  Administered 2016-02-06: 50 mg via ORAL
  Filled 2016-02-05: qty 1

## 2016-02-05 MED ORDER — ENOXAPARIN SODIUM 40 MG/0.4ML ~~LOC~~ SOLN: 40.0000 mg | SUBCUTANEOUS | Status: DC

## 2016-02-05 MED ORDER — ASPIRIN EC 325 MG PO TBEC
325.0000 mg | DELAYED_RELEASE_TABLET | Freq: Every day | ORAL | Status: DC
  Administered 2016-02-05 – 2016-02-06 (×2): 325 mg via ORAL
  Filled 2016-02-05 (×2): qty 1

## 2016-02-05 MED ORDER — PANTOPRAZOLE SODIUM 40 MG PO TBEC
40.0000 mg | DELAYED_RELEASE_TABLET | Freq: Every day | ORAL | Status: DC
  Administered 2016-02-05 – 2016-02-06 (×2): 40 mg via ORAL
  Filled 2016-02-05 (×2): qty 1

## 2016-02-05 NOTE — Progress Notes (Signed)
  Echocardiogram 2D Echocardiogram has been performed.  Rendi Mapel L Androw 02/05/2016, 5:07 PM

## 2016-02-05 NOTE — Progress Notes (Signed)
OT Cancellation Note  Patient Details Name: Frank RilyDanny Luna MRN: [id number] DOB: 1960/09/17   Cancelled Treatment:    Reason Eval/Treat Not Completed: Other (comment) the patient is on strict bedrest.  Ignacia PalmaCathy Meeghan Skipper, OTR/L [phone] 02/05/2016

## 2016-02-05 NOTE — Progress Notes (Signed)
STROKE TEAM PROGRESS NOTE   HISTORY OF PRESENT ILLNESS (per record) Frank Luna is an 55 y.o. 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 Patient initially presented at [facility] due to acute onset language impairment and confusion. He was with his wife at the store and was noted to have difficulty expressing himself. Had NIHSS 3, CT head without acute abnormality, was considered a suitable candidate for treatment with iv thrombolysis and managed accordingly. A subsequent CTA brain showed no LVO. Upon arrival to the Neuro ICU he was still with slight dysphasia, NIHSS 1.   1.5 years ago patient had a reported stroke; however, he was not kept in the hospital. He reports being sent home from the ED.  Patient was administered IV t-PA secondary to concern for likely left MCA CVA. He was admitted to the neuro ICU for further evaluation and treatment.  SUBJECTIVE (INTERVAL HISTORY) Patient was seen and examined this morning. Patient reports a headache overnight, frontal, for the past 3 days. Currently, he denies headache, but he does admit to toothache. He feels that his headache comes from his tooth pain. He denies photophobia and phonophobia.   OBJECTIVE Temp:  [97.9 F (36.6 C)-99 F (37.2 C)] 98.3 F (36.8 C) (12/18 1200) Pulse Rate:  [59-95] 71 (12/18 1300) Cardiac Rhythm: Normal sinus rhythm (12/18 1200) Resp:  [12-25] 17 (12/18 1300) BP: (120-192)/(59-125) 147/73 (12/18 1300) SpO2:  [91 %-100 %] 96 % (12/18 1300) Weight:  [185 lb (83.9 kg)-208 lb 12.4 oz (94.7 kg)] 200 lb 6.4 oz (90.9 kg) (12/18 0500)  CBC:   Recent Labs Lab 02/04/16 1429 02/04/16 1437  WBC 7.2  --   NEUTROABS 3.9  --   HGB 16.9 16.7  HCT 48.8 49.0  MCV 88.7  --   PLT 99*  --     Basic Metabolic Panel:   Recent Labs Lab 02/04/16 1429  02/04/16 1437  NA 137 139  K 4.0 4.4  CL 101 102  CO2 27  --   GLUCOSE 104* 106*  BUN 12 13  CREATININE 1.01 0.90  CALCIUM 9.7  --     Lipid Panel:     Component Value Date/Time   CHOL 156 02/05/2016 0447   TRIG 138 02/05/2016 0447   HDL 30 (L) 02/05/2016 0447   CHOLHDL 5.2 02/05/2016 0447   VLDL 28 02/05/2016 0447   LDLCALC 98 02/05/2016 0447   HgbA1c: No results found for: HGBA1C Urine Drug Screen: No results found for: LABOPIA, COCAINSCRNUR, LABBENZ, AMPHETMU, THCU, LABBARB    IMAGING  Ct Angio Head W Or Wo Contrast  Result Date: 02/04/2016 CLINICAL DATA:  Aphasia.  Sudden onset confusion. EXAM: CT ANGIOGRAPHY HEAD AND NECK TECHNIQUE: Multidetector CT imaging of the head and neck was performed using the standard protocol during bolus administration of intravenous contrast. Multiplanar CT image reconstructions and MIPs were obtained to evaluate the vascular anatomy. Carotid stenosis measurements (when applicable) are obtained utilizing NASCET criteria, using the distal internal carotid diameter as the denominator. CONTRAST:  100 mL Isovue 370 COMPARISON:  None. FINDINGS: CTA NECK FINDINGS Aortic arch: Standard branching. Arch vessel origins are widely patent. Right carotid system: Patent without evidence of stenosis or dissection. Mild calcified and noncalcified plaque about the carotid bifurcation. Left carotid system: Patent without evidence of stenosis or dissection. Mild calcified and noncalcified plaque about the carotid  bifurcation. Vertebral arteries: Patent with both vertebral arteries being somewhat small in size. No evidence of stenosis or dissection. Skeleton: Mild-to-moderate C5-6 and C6-7 disc degeneration. Other neck: Bilateral mastoid effusions. Mild mucosal thickening and small volume secretions in the right maxillary sinus, with mild scattered mucosal thickening in the paranasal sinuses elsewhere. Upper chest: Clear lung apices. Review of the MIP images confirms the  above findings CTA HEAD FINDINGS Anterior circulation: The internal carotid arteries are patent from skullbase to carotid termini with mild siphon atherosclerosis but no significant stenosis. MCAs and ACAs are patent with moderate branch vessel atherosclerotic type changes but no evidence of major branch occlusion or significant proximal stenosis. There are mild-to-moderate right and moderate left mid A2 segment stenoses. No aneurysm. Posterior circulation: The right vertebral artery ends in PICA. The left vertebral artery supplies the proximal basilar. AICA origins are grossly patent. 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 SCA origins are patent. There is a patent left posterior communicating artery. PCAs are patent with moderate branch vessel irregularity but no significant proximal stenosis. No aneurysm. Venous sinuses: Patent. Anatomic variants: Persistent right trigeminal artery. Delayed phase: No abnormal enhancement. Review of the MIP images confirms the above findings IMPRESSION: 1. No major intracranial vessel occlusion or flow limiting proximal stenosis. Moderate branch vessel atherosclerotic changes. 2. Persistent right trigeminal artery, a developmental variant. 3. Mild cervical carotid artery atherosclerosis without stenosis. Electronically Signed   By: Sebastian AcheAllen  Grady M.D.   On: 02/04/2016 16:09   Ct Head Wo Contrast  Result Date: 02/04/2016 CLINICAL DATA:  Altered mental status. Status post tPA administration EXAM: CT HEAD WITHOUT CONTRAST TECHNIQUE: Contiguous axial images were obtained from the base of the skull through the vertex without intravenous contrast. COMPARISON:  Head CT and CTA 02/04/2016 FINDINGS: Brain: No mass lesion, intraparenchymal hemorrhage or extra-axial collection. No evidence of acute cortical infarct. Unchanged  appearance of chronic left caudate head lacunar infarct. There is periventricular hypoattenuation compatible with chronic microvascular disease. Vascular: No hyperdense vessel or unexpected calcification. Skull: Normal visualized skull base, calvarium and extracranial soft tissues. Sinuses/Orbits: Moderate paranasal sinus mucosal thickening without fluid levels, greatest in the right maxillary sinus. Bilateral small mastoid effusions. The orbits are normal. Normal orbits. IMPRESSION: 1. No intracranial hemorrhage. 2. Unchanged head CT without acute findings. Electronically Signed   By: Deatra RobinsonKevin  Herman M.D.   On: 02/04/2016 18:55   Ct Angio Neck W And/or Wo Contrast  Result Date: 02/04/2016 CLINICAL DATA:  Aphasia.  Sudden onset confusion. EXAM: CT ANGIOGRAPHY HEAD AND NECK TECHNIQUE: Multidetector CT imaging of the head and neck was performed using the standard protocol during bolus administration of intravenous contrast. Multiplanar CT image reconstructions and MIPs were obtained to evaluate the vascular anatomy. Carotid stenosis measurements (when applicable) are obtained utilizing NASCET criteria, using the distal internal carotid diameter as the denominator. CONTRAST:  100 mL Isovue 370 COMPARISON:  None. FINDINGS: CTA NECK FINDINGS Aortic arch: Standard branching. Arch vessel origins are widely patent. Right carotid system: Patent without evidence of stenosis or dissection. Mild calcified and noncalcified plaque about the carotid bifurcation. Left carotid system: Patent without evidence of stenosis or dissection. Mild calcified and noncalcified plaque about the carotid bifurcation. Vertebral arteries: Patent with both vertebral arteries being somewhat small in size. No evidence of stenosis or dissection. Skeleton: Mild-to-moderate C5-6 and C6-7 disc degeneration. Other neck:  Bilateral mastoid effusions. Mild mucosal thickening and small volume secretions in the right maxillary sinus, with mild scattered  mucosal thickening in the paranasal sinuses elsewhere. Upper chest: Clear lung apices. Review of the MIP images confirms the above findings CTA HEAD FINDINGS Anterior circulation: The internal carotid arteries are patent from skullbase to carotid termini with mild siphon atherosclerosis but no significant stenosis. MCAs and ACAs are patent with moderate branch vessel atherosclerotic type changes but no evidence of major branch occlusion or significant proximal stenosis. There are mild-to-moderate right and moderate left mid A2 segment stenoses. No aneurysm. Posterior circulation: The right vertebral artery ends in PICA. The left vertebral artery supplies the proximal basilar. AICA origins are grossly patent. 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 SCA origins are patent. There is a patent left posterior communicating artery. PCAs are patent with moderate branch vessel irregularity but no significant proximal stenosis. No aneurysm. Venous sinuses: Patent. Anatomic variants: Persistent right trigeminal artery. Delayed phase: No abnormal enhancement. Review of the MIP images confirms the above findings IMPRESSION: 1. No major intracranial vessel occlusion or flow limiting proximal stenosis. Moderate branch vessel atherosclerotic changes. 2. Persistent right trigeminal artery, a developmental variant. 3. Mild cervical carotid artery atherosclerosis without stenosis. Electronically Signed   By: Sebastian AcheAllen  Grady M.D.   On: 02/04/2016 16:09   Ct Head Code Stroke W/o Cm  Result Date: 02/04/2016 CLINICAL DATA:  Code stroke. Sudden onset of confusion and speech disturbance, 1 hour duration. EXAM: CT HEAD WITHOUT CONTRAST TECHNIQUE: Contiguous axial images were obtained from the base of the skull through the vertex without intravenous contrast. COMPARISON:  None. FINDINGS:  Brain: Mild generalized brain atrophy. Chronic small-vessel changes of the cerebral hemispheric white matter. Old lacunar infarctions left basal ganglia. No sign of acute brain infarction by CT at this time. No mass lesion, hemorrhage, hydrocephalus or extra-axial collection. Vascular: Questionable hyperdense left MCA branches in the insular region. Focal calcified plaque at the supraclinoid ICA on the left. Skull: Negative Sinuses/Orbits: Mucosal thickening of the maxillary sinuses right more than left. Small mastoid effusions. Other: None significant ASPECTS (Alberta Stroke Program Early CT Score) - Ganglionic level infarction (caudate, lentiform nuclei, internal capsule, insula, M1-M3 cortex): 7 - Supraganglionic infarction (M4-M6 cortex): 3 Total score (0-10 with 10 being normal): 10 IMPRESSION: 1. Atrophy and chronic small-vessel ischemic changes including old lacunar infarctions left basal ganglia. Question few hyperdense left MCA branches in the insular region, not definite. No parenchymal findings of acute infarction. No hemorrhage. 2. ASPECTS is 10 These results were called by telephone at the time of interpretation on 02/04/2016 at 2:45 pm to Dr. Benjiman CoreNATHAN PICKERING , who verbally acknowledged these results. Electronically Signed   By: Paulina FusiMark  Shogry M.D.   On: 02/04/2016 14:49   PHYSICAL EXAM Vitals:   02/05/16 1000 02/05/16 1100 02/05/16 1200 02/05/16 1300  BP: (!) 141/78 140/73 (!) 151/79 (!) 147/73  Pulse: 70 71 70 71  Resp: 16 15 19 17   Temp:   98.3 F (36.8 C)   TempSrc:   Oral   SpO2: 95% 96% 96% 96%  Weight:      Height:       General: Vital signs reviewed.  Patient is in no acute distress and cooperative with exam.  Head: Normocephalic and atraumatic. Eyes: EOMI, conjunctivae normal, no scleral icterus.  Neck: Supple, trachea midline  Cardiovascular: RRR Pulmonary/Chest: Clear to anterior auscultation bilaterally Abdominal: Soft, non-tender, non-distended, BS + Extremities: No  lower extremity edema bilaterally Skin: Warm, dry and intact.  Neurological: A&Ox3, able to spell WORLD forwards, unable to spell WORLD backwards. Unable to subtract 35 cent from one dollar. Naming intact. No evidence of aphasia. Visual fields intact. Sensation intact bilaterally. Strength is normal and symmetric bilaterally. Possible slight right facial weakness. Unable to remember 3 words after a period of time.   ASSESSMENT/PLAN Delontae Lamm is an 55 y.o. male, right handed,with a past medical history that is significant for HTN, smoking, and stroke 1.5 years ago without residual deficits transferred to [facility] after receiving tPA for likely left MCA territory infarct. Patient initially presented at [facility] due to acute onset language impairment and confusion. He was with his wife at the store and was noted to have difficulty expressing himself. Had NIHSS 3, CT head without acute abnormality, was given tPA.  Presumed Left MCA CVA  MRI  pending  2D Echo  pending  LDL 98  HgbA1c pending  SCDs for VTE prophylaxis- waiting to start until 24 hours outside of tPA administration Diet Heart Room service appropriate? Yes; Fluid consistency: Thin  No antithrombotic prior to admission, likely will start aspirin 325 mg daily if CVA confirmed  Patient counseled to be compliant with his antithrombotic medications  Ongoing aggressive stroke risk factor management  Therapy recommendations: pending; will be off strict bedrest today at 1500  Disposition:  pending  Hypertension  Stable  Permissive hypertension (OK if < 220/120) but gradually normalize in 5-7 days  Long-term BP goal normotensive  Hyperlipidemia  Home meds: None  LDL 98, goal < 70  Will wait until CVA confirmed prior to starting statin  Diabetes  HgbA1c pending, goal < 7.0  Other Stroke Risk Factors  Cigarette smoker 1.5-2 ppd, advised to stop smoking  Obesity, Body mass index is 31.39 kg/m., recommend weight loss, diet  and exercise as appropriate   Hx stroke/TIA  Hospital day # 1  Karlene Lineman, DO PGY-3 Internal Medicine Resident Pager # [phone] 02/05/2016 1:06 PM  To contact Stroke Continuity provider, please refer to [email or web address]. After hours, contact General Neurology

## 2016-02-05 NOTE — Progress Notes (Signed)
Neuro MD aware that MRI was done and resulted.

## 2016-02-05 NOTE — Progress Notes (Signed)
PT Cancellation Note  Patient Details Name: Frank Luna MRN: [id number] DOB: 10-Mar-1960   Cancelled Treatment:    Reason Eval/Treat Not Completed: Patient not medically ready.  Pt currently on strict bedrest.  Please advance activity order once appropriate for PT and mobility.     [name], PT  [phone] 02/05/2016, 8:21 AM

## 2016-02-06 DIAGNOSIS — E785 Hyperlipidemia, unspecified: Secondary | ICD-10-CM | POA: Diagnosis present

## 2016-02-06 DIAGNOSIS — R4189 Other symptoms and signs involving cognitive functions and awareness: Secondary | ICD-10-CM

## 2016-02-06 DIAGNOSIS — Z8673 Personal history of transient ischemic attack (TIA), and cerebral infarction without residual deficits: Secondary | ICD-10-CM

## 2016-02-06 DIAGNOSIS — R7303 Prediabetes: Secondary | ICD-10-CM | POA: Diagnosis present

## 2016-02-06 DIAGNOSIS — I639 Cerebral infarction, unspecified: Secondary | ICD-10-CM

## 2016-02-06 DIAGNOSIS — E119 Type 2 diabetes mellitus without complications: Secondary | ICD-10-CM | POA: Diagnosis present

## 2016-02-06 LAB — HEMOGLOBIN A1C
HEMOGLOBIN A1C: 6 % — AB (ref 4.8–5.6)
MEAN PLASMA GLUCOSE: 126 mg/dL

## 2016-02-06 MED ORDER — ASPIRIN 325 MG PO TBEC: 325.0000 mg | DELAYED_RELEASE_TABLET | Freq: Every day | ORAL | 3 refills | Status: DC

## 2016-02-06 MED ORDER — METOPROLOL SUCCINATE ER 50 MG PO TB24: 50.0000 mg | ORAL_TABLET | Freq: Every day | ORAL | 3 refills | Status: DC

## 2016-02-06 MED ORDER — ATORVASTATIN CALCIUM 40 MG PO TABS: 40.0000 mg | ORAL_TABLET | Freq: Every day | ORAL | 3 refills | Status: DC

## 2016-02-06 NOTE — Progress Notes (Signed)
OT Screen Note  Patient Details Name: Frank Luna MRN: [id number] DOB: 02/07/1961   Cancelled Treatment:    Reason Eval/Treat Not Completed: OT screened, no needs identified, will sign off  Felecia ShellingJones, Pheonix Clinkscale B   Khylei Wilms, Brynn   OTR/L Pager: [phone] Office: [phone] .  02/06/2016, 9:46 AM

## 2016-02-06 NOTE — Care Management Note (Signed)
Case Management Note  Patient Details  Name: Frank Luna MRN: [id number] Date of Birth: 1960/05/03  Subjective/Objective: Pt admitted on 02/04/16 with acute onset language impairment and confusion.  MRI showed small acute infarct in the LT corona radiata.  PTA, pt independent, lives with spouse.                   Action/Plan: Plan dc home today with spouse.  PT recommending no OP follow up.  No dc needs identified.    Expected Discharge Date:     02/06/2016             Expected Discharge Plan:  Home/Self Care  In-House Referral:     Discharge planning Services  CM Consult  Post Acute Care Choice:    Choice offered to:     DME Arranged:    DME Agency:     HH Arranged:    HH Agency:     Status of Service:  Completed, signed off  If discussed at [employer] Length of Stay Meetings, dates discussed:    Additional Comments:  Glennon Macmerson, Glenden Rossell M, RN 02/06/2016, 11:41 AM

## 2016-02-06 NOTE — Discharge Summary (Signed)
Stroke Discharge Summary  Patient ID: Frank Luna   MRN: [id number]      DOB: May 28, 1960  Date of Admission: 02/04/2016 Date of Discharge: 02/06/2016  Attending Physician:  Marvel Plan, MD, Stroke MD Consulting Physician(s):    None  Patient's PCP:  Graylin Shiver [place]  DISCHARGE DIAGNOSIS: Acute Left Corona Radiata CVA Principal Problem:   Acute ischemic stroke [facility]) Active Problems:   Speech abnormality   Smoker   Essential hypertension   Hyperlipidemia   Cognitive impairment   Morbid obesity (HCC)   Pre-diabetes   History of CVA (cerebrovascular accident)  BMI: Body mass index is 31.39 kg/m.  Past Medical History:  Diagnosis Date  . Hypertension   . Stroke [facility])     Allergies as of 02/06/2016      Reactions   Penicillins    Unknown reaction      Medication List    STOP taking these medications   ibuprofen 200 MG tablet Commonly known as:  ADVIL,MOTRIN     TAKE these medications   acetaminophen 500 MG tablet Commonly known as:  TYLENOL Take 1,000 mg by mouth every 6 (six) hours as needed for headache (pain).   aspirin 325 MG EC tablet Take 1 tablet (325 mg total) by mouth daily. Start taking on:  02/07/2016   atorvastatin 40 MG tablet Commonly known as:  LIPITOR Take 1 tablet (40 mg total) by mouth daily at 6 PM.   metoprolol succinate 50 MG 24 hr tablet Commonly known as:  TOPROL-XL Take 1 tablet (50 mg total) by mouth daily. Take with or immediately following a meal.       LABORATORY STUDIES CBC    Component Value Date/Time   WBC 7.2 02/04/2016 1429   RBC 5.50 02/04/2016 1429   HGB 16.7 02/04/2016 1437   HCT 49.0 02/04/2016 1437   PLT 99 (L) 02/04/2016 1429   MCV 88.7 02/04/2016 1429   MCH 30.7 02/04/2016 1429   MCHC 34.6 02/04/2016 1429   RDW 12.5 02/04/2016 1429   LYMPHSABS 2.4 02/04/2016 1429   MONOABS 0.6 02/04/2016 1429   EOSABS 0.3 02/04/2016 1429   BASOSABS 0.0 02/04/2016 1429   CMP    Component Value Date/Time    NA 139 02/04/2016 1437   K 4.4 02/04/2016 1437   CL 102 02/04/2016 1437   CO2 27 02/04/2016 1429   GLUCOSE 106 (H) 02/04/2016 1437   BUN 13 02/04/2016 1437   CREATININE 0.90 02/04/2016 1437   CALCIUM 9.7 02/04/2016 1429   PROT 7.8 02/04/2016 1429   ALBUMIN 4.5 02/04/2016 1429   AST 20 02/04/2016 1429   ALT 34 02/04/2016 1429   ALKPHOS 118 02/04/2016 1429   BILITOT 0.5 02/04/2016 1429   GFRNONAA >60 02/04/2016 1429   GFRAA >60 02/04/2016 1429   COAGS Lab Results  Component Value Date   INR 0.98 02/04/2016   Lipid Panel    Component Value Date/Time   CHOL 156 02/05/2016 0447   TRIG 138 02/05/2016 0447   HDL 30 (L) 02/05/2016 0447   CHOLHDL 5.2 02/05/2016 0447   VLDL 28 02/05/2016 0447   LDLCALC 98 02/05/2016 0447   HgbA1C  Lab Results  Component Value Date   HGBA1C 6.0 (H) 02/05/2016   Urinalysis    Component Value Date/Time   COLORURINE YELLOW 02/05/2016 1533   APPEARANCEUR CLEAR 02/05/2016 1533   LABSPEC 1.014 02/05/2016 1533   PHURINE 6.0 02/05/2016 1533   GLUCOSEU NEGATIVE 02/05/2016 1533  HGBUR NEGATIVE 02/05/2016 1533   BILIRUBINUR NEGATIVE 02/05/2016 1533   KETONESUR NEGATIVE 02/05/2016 1533   PROTEINUR NEGATIVE 02/05/2016 1533   NITRITE NEGATIVE 02/05/2016 1533   LEUKOCYTESUR NEGATIVE 02/05/2016 1533   Urine Drug Screen     Component Value Date/Time   LABOPIA NONE DETECTED 02/05/2016 1532   COCAINSCRNUR NONE DETECTED 02/05/2016 1532   LABBENZ NONE DETECTED 02/05/2016 1532   AMPHETMU NONE DETECTED 02/05/2016 1532   THCU NONE DETECTED 02/05/2016 1532   LABBARB NONE DETECTED 02/05/2016 1532    Alcohol Level    Component Value Date/Time   ETH <5 02/04/2016 1429    SIGNIFICANT DIAGNOSTIC STUDIES CT Head wo contrast: 1. Atrophy and chronic small-vessel ischemic changes including old lacunar infarctions left basal ganglia. Question few hyperdense left MCA branches in the insular region, not definite. No parenchymal findings of acute  infarction. No hemorrhage.  CTA Head Neck: 1. No major intracranial vessel occlusion or flow limiting proximal stenosis. Moderate branch vessel atherosclerotic changes. 2. Persistent right trigeminal artery, a developmental variant. 3. Mild cervical carotid artery atherosclerosis without stenosis.  CT Head wo contrast: 1. No intracranial hemorrhage. 2. Unchanged head CT without acute findings.  MRI Brain wo contrast: 1. Small acute infarct in the left corona radiata. 2. Chronic small vessel ischemic disease with multiple chronic lacunar infarcts as above.  2D Echo:  - Left ventricle: The cavity size was normal. There was mild   concentric hypertrophy. Systolic function was normal. The   estimated ejection fraction was in the range of 55% to 60%. Wall   motion was normal; there were no regional wall motion   abnormalities. Left ventricular diastolic function parameters   were normal. - Aortic valve: Trileaflet; normal thickness, mildly calcified   leaflets. - Mitral valve: Minimal calcification. - Tricuspid valve: There was trivial regurgitation. - Pulmonary arteries: Systolic pressure could not be accurately   estimated.    HISTORY OF PRESENT ILLNESS Frank Luna is an 55 y.o. 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 Patient initially presented at [facility] due to acute onset language impairment and confusion. He was with his wife at the store and was noted to have difficulty expressing himself. Had NIHSS 3, CT head without acute abnormality, was considered a suitable candidate for treatment with iv thrombolysis and managed accordingly. A subsequent CTA brain showed no LVO. Upon arrival to the NICU he was still with slight dysphasia, NIHSS 1. Presently, denies HA, vertigo, double vision,  difficulty swallowing, focal weakness or numbness, or visual disturbances.  HOSPITAL COURSE Patient was admitted to the Neuro ICU for monitoring after tPA administration. Patient did well and returned back to baseline. His residual deficits included fund of knowledge deficits and recall. He was started on ASA 325 mg QD, Atorvastatin 40 mg QD and restarted on his home Metoprolol 50 mg QD. He will likely need additional titration or addition of blood pressure medications. Other significant risk factors included obesity, likely OSA and ongoing tobacco abuse. Patient was counseled on diet and exercise, weight loss, tobacco cessation, medication compliance and instructed to follow up with both his PCP and with neurology after discharge. Patient was discharged to home as PT/OT recommend no further follow up.   DISCHARGE EXAM Blood pressure (!) 155/104, pulse 67, temperature 97.6 F (36.4 C), temperature source Axillary, resp. rate 13, height 5'  7" (1.702 m), weight 200 lb 6.4 oz (90.9 kg), SpO2 97 %. General - Well nourished, well developed, in no apparent distress. Ophthalmologic - Fundi not examined. Cardiovascular - Regular rate and rhythm. Mental Status -  Level of arousal and orientation to time, place, and person were intact. Language including expression, naming, repetition, comprehension was assessed and found intact. Attention span and concentration were impaired, not able to do WORLD backward or calculation. Recent and remote memory were 3/3 registration but 0/3 delayed recall. Fund of Knowledge was assessed and was impaired, 2/5 presidents Cranial Nerves II - XII - II - Visual field intact OU. III, IV, VI - Extraocular movements intact. V - Facial sensation intact bilaterally. VII - Facial movement intact bilaterally. VIII - Hearing & vestibular intact bilaterally. X - Palate elevates symmetrically. XI - Chin turning & shoulder shrug intact bilaterally. XII - Tongue protrusion  intact. Motor Strength - The patient's strength was normal in all extremities and pronator drift was absent.  Bulk was normal and fasciculations were absent.   Motor Tone - Muscle tone was assessed at the neck and appendages and was normal. Reflexes - The patient's reflexes were 1+ in all extremities and he had no pathological reflexes. Sensory - Light touch, temperature/pinprick were assessed and were symmetrical.   Coordination - The patient had normal movements in the hands with no ataxia or dysmetria.  Tremor was absent. Gait and Station - normal gait and station  Discharge Diet   Diet Heart Room service appropriate? Yes; Fluid consistency: Thin liquids  DISCHARGE PLAN  Disposition:  Discharge to home  aspirin 325 mg daily for secondary stroke prevention.  Atorvastatin 40 mg QD   Ongoing risk factor control by Primary Care Physician at time of discharge  Follow-[place] [name] in 2 weeks.  Follow-up with Darrol Angelarolyn Martin, NP [facility] in 6 weeks, office to schedule an appointment.  50 minutes were spent preparing discharge.  Karlene LinemanAlexa Lilyona Richner, DO PGY-3 Internal Medicine Resident Pager # [phone] 02/06/2016 11:57 AM

## 2016-02-06 NOTE — Evaluation (Signed)
Speech Language Pathology Evaluation Patient Details Name: Frank Luna MRN: [id number] DOB: 1960/08/24 Today's Date: 02/06/2016 Time: [id number] SLP Time Calculation (min) (ACUTE ONLY): 33 min  Problem List:  Patient Active Problem List   Diagnosis Date Noted  . Hyperlipidemia 02/06/2016  . Cognitive impairment 02/06/2016  . Morbid obesity (HCC) 02/06/2016  . Pre-diabetes 02/06/2016  . History of CVA (cerebrovascular accident) 02/06/2016  . Speech abnormality   . Smoker   . Essential hypertension   . Acute ischemic stroke (HCC) 02/04/2016   Past Medical History:  Past Medical History:  Diagnosis Date  . Hypertension   . Stroke [facility](HCC)    Past Surgical History: No past surgical history on file. HPI:  Ptis a 55 y.o.male, right handed, with a past medical history significant for HTN , smoking, and stroke (~1 year ago per pt report), transferred to this facility 02/04/16 after receiving iv thrombolysis for likely left MCA territory infarct. Per MD reports, patient presented with acute onset language impairment, confusion, impaired attention and delayed recall. MRI 02/04/2016 showed small acute infarct in the left corona radiata, chronic small vessel ischemic disease with multiple chronic lacunar infarcts.   Assessment / Plan / Recommendation Clinical Impression  Clinical impression is that patient's cognitive communication status is [facility]. [facility] was administered the [facility] and scored 12/30, with deficits in executive function, (2/5), naming (2/3), attention (1/5), Language (1/3) and delayed recall (0/5). While this standardized score indicates cognitive communication impairment, patient and his wife report he is at his functional baseline. Additional tasks were performed to assess naming, auditory comprehension and judgement; [facility] performed [facility]. The patient and his wife were provided with education regarding strategies to improve functional independence. Patient's wife states she will  continue to provide assistance with medications. No further follow-up with SLP recommended at this time.    SLP Assessment  Patient does not need any further Speech Lanaguage Pathology Services    Follow Up Recommendations       Frequency and Duration           SLP Evaluation Cognition  Overall Cognitive Status: Within Functional Limits for tasks assessed Arousal/Alertness: Awake/alert Orientation Level: Oriented X4 Attention: Sustained Sustained Attention: Impaired Memory: Impaired Memory Impairment: Decreased short term memory Decreased Short Term Memory: Verbal basic;Verbal complex Awareness: Appears intact Problem Solving: Appears intact Executive Function: Sequencing Sequencing: Impaired Sequencing Impairment: Verbal complex Safety/Judgment: Appears intact       Comprehension  Auditory Comprehension Overall Auditory Comprehension: Appears within functional limits for tasks assessed Visual Recognition/Discrimination Discrimination: Within Function Limits Reading Comprehension Reading Status: Within funtional limits    Expression Expression Primary Mode of Expression: Verbal Verbal Expression Overall Verbal Expression: Appears within functional limits for tasks assessed Repetition: No impairment Naming: Impairment (min word-finding, self-corrects with additional time) Pragmatics: No impairment Written Expression Dominant Hand: Right Written Expression: Not tested   Oral / Motor  Oral Motor/Sensory Function Overall Oral Motor/Sensory Function: Within functional limits Motor Speech Overall Motor Speech: Appears within functional limits for tasks assessed   GO                   Rondel BatonMary Beth Taher Vannote, MS CF-SLP Speech-Language Pathologist  Arlana LindauMary E Chessa Barrasso 02/06/2016, 11:53 AM

## 2016-02-06 NOTE — Progress Notes (Signed)
STROKE TEAM PROGRESS NOTE   HISTORY OF PRESENT ILLNESS (per record) Frank Luna is an 55 y.o. 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 Upon arrival to the Neuro ICU he was still with slight dysphasia, NIHSS 1. MRI confirmed an acute left CVA in the corona radiata and chronic lacunar infarcts.   SUBJECTIVE (INTERVAL HISTORY) Patient was seen and examined this morning. Patient feels well and feels that he is back to his baseline. He denies headache, weakness or numbness. He wants to go home.   OBJECTIVE Temp:  [97.6 F (36.4 C)-98.3 F (36.8 C)] 97.6 F (36.4 C) (12/19 0800) Pulse Rate:  [56-78] 67 (12/19 0900) Cardiac Rhythm: Normal sinus rhythm (12/19 0900) Resp:  [13-21] 13 (12/19 0900) BP: (138-165)/(67-118) 155/104 (12/19 0800) SpO2:  [88 %-98 %] 97 % (12/19 0900)  CBC:   Recent Labs Lab 02/04/16 1429 02/04/16 1437  WBC 7.2  --   NEUTROABS 3.9  --   HGB 16.9 16.7  HCT 48.8 49.0  MCV 88.7  --   PLT 99*  --     Basic Metabolic Panel:   Recent Labs Lab 02/04/16 1429 02/04/16 1437  NA 137 139  K 4.0 4.4  CL 101 102  CO2 27  --   GLUCOSE 104* 106*  BUN 12 13  CREATININE 1.01 0.90  CALCIUM 9.7  --     Lipid Panel:     Component Value Date/Time   CHOL 156 02/05/2016 0447   TRIG 138 02/05/2016 0447   HDL 30 (L) 02/05/2016 0447   CHOLHDL 5.2 02/05/2016 0447   VLDL 28 02/05/2016 0447   LDLCALC 98 02/05/2016 0447   HgbA1c:  Lab Results  Component Value Date   HGBA1C 6.0 (H) 02/05/2016   Urine Drug Screen:     Component Value Date/Time   LABOPIA NONE DETECTED 02/05/2016 1532   COCAINSCRNUR NONE DETECTED 02/05/2016 1532   LABBENZ NONE DETECTED 02/05/2016 1532   AMPHETMU NONE DETECTED 02/05/2016 1532   THCU NONE DETECTED 02/05/2016 1532   LABBARB NONE DETECTED 02/05/2016 1532       IMAGING  Ct Angio Head W Or Wo Contrast  Result Date: 02/04/2016 CLINICAL DATA:  Aphasia.  Sudden onset confusion. EXAM: CT ANGIOGRAPHY HEAD AND NECK TECHNIQUE: Multidetector CT imaging of the head and neck was performed using the standard protocol during bolus administration of intravenous contrast. Multiplanar CT image reconstructions and MIPs were obtained to evaluate the vascular anatomy. Carotid stenosis measurements (when applicable) are obtained utilizing NASCET criteria, using the distal internal carotid diameter as the denominator. CONTRAST:  100 mL Isovue 370 COMPARISON:  None. FINDINGS: CTA NECK FINDINGS Aortic arch: Standard branching. Arch vessel origins are widely patent. Right carotid system: Patent without evidence of stenosis or dissection. Mild calcified and noncalcified plaque about the carotid bifurcation. Left carotid system: Patent without evidence of stenosis or dissection. Mild calcified and noncalcified plaque about the carotid bifurcation. Vertebral arteries: Patent with both vertebral arteries being somewhat small in size. No evidence of stenosis or dissection. Skeleton: Mild-to-moderate C5-6 and C6-7 disc degeneration. Other neck: Bilateral mastoid effusions. Mild mucosal thickening and small volume secretions in the right maxillary sinus, with mild scattered mucosal thickening in the paranasal sinuses elsewhere. Upper chest: Clear lung apices. Review of the MIP images confirms the above findings CTA HEAD FINDINGS Anterior circulation: The internal  carotid arteries are patent from skullbase to carotid termini with mild siphon atherosclerosis but no significant stenosis. MCAs and ACAs are patent with moderate branch vessel atherosclerotic type changes but no evidence of major branch occlusion or significant proximal stenosis. There are mild-to-moderate right and moderate left mid A2 segment stenoses. No aneurysm. Posterior circulation: The right vertebral artery ends in PICA.  The left vertebral artery supplies the proximal basilar. AICA origins are grossly patent. 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 SCA origins are patent. There is a patent left posterior communicating artery. PCAs are patent with moderate branch vessel irregularity but no significant proximal stenosis. No aneurysm. Venous sinuses: Patent. Anatomic variants: Persistent right trigeminal artery. Delayed phase: No abnormal enhancement. Review of the MIP images confirms the above findings IMPRESSION: 1. No major intracranial vessel occlusion or flow limiting proximal stenosis. Moderate branch vessel atherosclerotic changes. 2. Persistent right trigeminal artery, a developmental variant. 3. Mild cervical carotid artery atherosclerosis without stenosis. Electronically Signed   By: Sebastian Ache M.D.   On: 02/04/2016 16:09   Ct Head Wo Contrast  Result Date: 02/04/2016 CLINICAL DATA:  Altered mental status. Status post tPA administration EXAM: CT HEAD WITHOUT CONTRAST TECHNIQUE: Contiguous axial images were obtained from the base of the skull through the vertex without intravenous contrast. COMPARISON:  Head CT and CTA 02/04/2016 FINDINGS: Brain: No mass lesion, intraparenchymal hemorrhage or extra-axial collection. No evidence of acute cortical infarct. Unchanged appearance of chronic left caudate head lacunar infarct. There is periventricular hypoattenuation compatible with chronic microvascular disease. Vascular: No hyperdense vessel or unexpected calcification. Skull: Normal visualized skull base, calvarium and extracranial soft tissues. Sinuses/Orbits: Moderate paranasal sinus mucosal thickening without fluid levels, greatest in the right maxillary sinus. Bilateral small mastoid effusions. The orbits are normal. Normal orbits. IMPRESSION: 1. No intracranial  hemorrhage. 2. Unchanged head CT without acute findings. Electronically Signed   By: Deatra Robinson M.D.   On: 02/04/2016 18:55   Ct Angio Neck W And/or Wo Contrast  Result Date: 02/04/2016 CLINICAL DATA:  Aphasia.  Sudden onset confusion. EXAM: CT ANGIOGRAPHY HEAD AND NECK TECHNIQUE: Multidetector CT imaging of the head and neck was performed using the standard protocol during bolus administration of intravenous contrast. Multiplanar CT image reconstructions and MIPs were obtained to evaluate the vascular anatomy. Carotid stenosis measurements (when applicable) are obtained utilizing NASCET criteria, using the distal internal carotid diameter as the denominator. CONTRAST:  100 mL Isovue 370 COMPARISON:  None. FINDINGS: CTA NECK FINDINGS Aortic arch: Standard branching. Arch vessel origins are widely patent. Right carotid system: Patent without evidence of stenosis or dissection. Mild calcified and noncalcified plaque about the carotid bifurcation. Left carotid system: Patent without evidence of stenosis or dissection. Mild calcified and noncalcified plaque about the carotid bifurcation. Vertebral arteries: Patent with both vertebral arteries being somewhat small in size. No evidence of stenosis or dissection. Skeleton: Mild-to-moderate C5-6 and C6-7 disc degeneration. Other neck: Bilateral mastoid effusions. Mild mucosal thickening and small volume secretions in the right maxillary sinus, with mild scattered mucosal thickening in the paranasal sinuses elsewhere. Upper chest: Clear lung apices. Review of the MIP images confirms the above findings CTA HEAD FINDINGS Anterior circulation: The internal carotid arteries are patent from skullbase to carotid termini with mild siphon atherosclerosis but no significant stenosis. MCAs and ACAs are patent with moderate branch vessel atherosclerotic type  changes but no evidence of major branch occlusion or significant proximal stenosis. There are mild-to-moderate right and  moderate left mid A2 segment stenoses. No aneurysm. Posterior circulation: The right vertebral artery ends in PICA. The left vertebral artery supplies the proximal basilar. AICA origins are grossly patent. 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 SCA origins are patent. There is a patent left posterior communicating artery. PCAs are patent with moderate branch vessel irregularity but no significant proximal stenosis. No aneurysm. Venous sinuses: Patent. Anatomic variants: Persistent right trigeminal artery. Delayed phase: No abnormal enhancement. Review of the MIP images confirms the above findings IMPRESSION: 1. No major intracranial vessel occlusion or flow limiting proximal stenosis. Moderate branch vessel atherosclerotic changes. 2. Persistent right trigeminal artery, a developmental variant. 3. Mild cervical carotid artery atherosclerosis without stenosis. Electronically Signed   By: Sebastian Ache M.D.   On: 02/04/2016 16:09   Mr Brain Wo Contrast  Result Date: 02/05/2016 CLINICAL DATA:  Stroke. Acute onset language impairment and confusion status post tPA. EXAM: MRI HEAD WITHOUT CONTRAST TECHNIQUE: Multiplanar, multiecho pulse sequences of the brain and surrounding structures were obtained without intravenous contrast. COMPARISON:  Head CT and CTA 02/04/2016 FINDINGS: Brain: There is a small acute infarct at the level of the anterior left corona radiata measuring 2.1 x 0.7 cm. There is a chronic lacunar infarct in the left caudate head with associated chronic blood products. Chronic lacunar infarcts are also present in the thalami and posterior right corona radiata. Small foci of subcortical and periventricular white matter T2 hyperintensity elsewhere are nonspecific but compatible with chronic small vessel ischemic disease. There is mild cerebral  atrophy. No mass, midline shift, or extra-axial fluid collection is seen. Vascular: Major intracranial vascular flow voids are preserved, with a persistent right trigeminal artery again noted. Skull and upper cervical spine: Unremarkable bone marrow signal. Sinuses/Orbits: Mild paranasal sinus mucosal thickening. Bilateral mastoid effusions. Unremarkable orbits. Other: None. IMPRESSION: 1. Small acute infarct in the left corona radiata. 2. Chronic small vessel ischemic disease with multiple chronic lacunar infarcts as above. Electronically Signed   By: Sebastian Ache M.D.   On: 02/05/2016 15:33   Ct Head Code Stroke W/o Cm  Result Date: 02/04/2016 CLINICAL DATA:  Code stroke. Sudden onset of confusion and speech disturbance, 1 hour duration. EXAM: CT HEAD WITHOUT CONTRAST TECHNIQUE: Contiguous axial images were obtained from the base of the skull through the vertex without intravenous contrast. COMPARISON:  None. FINDINGS: Brain: Mild generalized brain atrophy. Chronic small-vessel changes of the cerebral hemispheric white matter. Old lacunar infarctions left basal ganglia. No sign of acute brain infarction by CT at this time. No mass lesion, hemorrhage, hydrocephalus or extra-axial collection. Vascular: Questionable hyperdense left MCA branches in the insular region. Focal calcified plaque at the supraclinoid ICA on the left. Skull: Negative Sinuses/Orbits: Mucosal thickening of the maxillary sinuses right more than left. Small mastoid effusions. Other: None significant ASPECTS (Alberta Stroke Program Early CT Score) - Ganglionic level infarction (caudate, lentiform nuclei, internal capsule, insula, M1-M3 cortex): 7 - Supraganglionic infarction (M4-M6 cortex): 3 Total score (0-10 with 10 being normal): 10 IMPRESSION: 1. Atrophy and chronic small-vessel ischemic changes including old lacunar infarctions left basal ganglia. Question few hyperdense left MCA branches in the insular region, not definite. No  parenchymal findings of acute infarction. No hemorrhage. 2. ASPECTS is 10 These results  were called by telephone at the time of interpretation on 02/04/2016 at 2:45 pm to Dr. Benjiman CoreNATHAN PICKERING , who verbally acknowledged these results. Electronically Signed   By: Paulina FusiMark  Shogry M.D.   On: 02/04/2016 14:49   PHYSICAL EXAM Vitals:   02/06/16 0400 02/06/16 0700 02/06/16 0800 02/06/16 0900  BP: 140/67  (!) 155/104   Pulse: (!) 56 78 (!) 59 67  Resp: 16 15 13 13   Temp: 97.6 F (36.4 C)  97.6 F (36.4 C)   TempSrc: Axillary  Axillary   SpO2: 95% 98% 95% 97%  Weight:      Height:       General: Vital signs reviewed.  Patient is in no acute distress and cooperative with exam.  Cardiovascular: RRR Pulmonary/Chest: Clear to anterior auscultation bilaterally Abdominal: Soft, non-tender, non-distended, BS + Extremities: No lower extremity edema bilaterally Neurological: A&Ox3, able to spell WORLD forwards, unable to spell WORLD backwards. Naming intact. No evidence of aphasia. Visual fields intact. Sensation intact bilaterally. Strength is normal and symmetric bilaterally. Recent memory intact, but 0/3 delayed recall. Fund of Knowledge impaired.   ASSESSMENT/PLAN Frank Luna is an 55 y.o. male, right handed,with a past medical history that is significant for HTN, smoking, and stroke 1.5 years ago without residual deficits transferred [facility] after receiving tPA for likely left MCA territory infarct. MRI confirmed an acute left corona radiata CVA.  Acute Left Corona Radiata CVA  MRI  Small acute CV in left corona radiata  2D Echo 55-60%, no RWMA  LDL 98  HgbA1c 6.0  Lovenox for VTE prophylaxis Diet Heart Room service appropriate? Yes; Fluid consistency: Thin  No antithrombotic prior to admission, will start aspirin 325 mg daily   Patient counseled to be compliant with his antithrombotic medications  Ongoing aggressive stroke risk factor management  Therapy recommendations:  None  Disposition:  Home today  Hypertension  Stable 130-150s/80s  Restarted metoprolol 50 mg QD, will need follow up with PCP  Long-term BP goal normotensive  Hyperlipidemia  Home meds: None  LDL 98, goal < 70  Atorvastatin 40 mg QD, continue at discharge  Pre-Diabetes  HgbA1c 6.0, goal < 7.0  Other Stroke Risk Factors  Cigarette smoker 1.5-2 ppd, advised to stop smoking  Obesity, Body mass index is 31.39 kg/m., recommend weight loss, diet and exercise as appropriate   Hx stroke/TIA  Likely OSA  Hospital day # 2  Karlene LinemanAlexa Carlyn Mullenbach, DO PGY-3 Internal Medicine Resident Pager # [phone] 02/06/2016 11:47 AM  To contact Stroke Continuity provider, please refer to [email or web address]. After hours, contact General Neurology

## 2016-02-06 NOTE — Evaluation (Addendum)
Physical Therapy Evaluation Patient Details Name: Frank RilyDanny Tofte MRN: [id number] DOB: 01-04-1961 Today's Date: 02/06/2016   History of Present Illness  Patient is a 55 y/o male with hx of HTN, CVA presents with acute onset language impairment and confusion. Had NIHSS 3, CT head without acute abnormality. MRI-Small acute infarct in the left corona radiata.  s/p tPA.  Clinical Impression  Patient presents close to functional baseline and able to tolerate gait training without overt LOB or difficulty. Tolerated donning socks without difficulty as well. Pt independent PTA. Education re- signs and symptoms of stroke and safety at home. Per wife and pt, pt has returned to baseline and has no skilled therapy needs. All education completed. Discharge from therapy.    Follow Up Recommendations No PT follow up;Supervision - Intermittent    Equipment Recommendations  None recommended by PT    Recommendations for Other Services       Precautions / Restrictions Precautions Precautions: None Restrictions Weight Bearing Restrictions: No      Mobility  Bed Mobility               General bed mobility comments: Up in chair upon PT arrival.   Transfers Overall transfer level: Modified independent               General transfer comment: Stood from chair x1 without difficulty.   Ambulation/Gait Ambulation/Gait assistance: Supervision Ambulation Distance (Feet): 150 Feet Assistive device: None Gait Pattern/deviations: Step-through pattern   Gait velocity interpretation: <1.8 ft/sec, indicative of risk for recurrent falls General Gait Details: Slow, steady gait even with head turns, no overt LOB.  Stairs  Negotiated 3 steps with use of rail- alternating and step to gait pattern with Min guard assist for safety.           Wheelchair Mobility    Modified Rankin (Stroke Patients Only) Modified Rankin (Stroke Patients Only) Pre-Morbid Rankin Score: No significant  disability Modified Rankin: Slight disability     Balance Overall balance assessment: Needs assistance Sitting-balance support: Feet supported;No upper extremity supported Sitting balance-Leahy Scale: Good Sitting balance - Comments: Able to reach outside BoS to donn socks without difficulty.    Standing balance support: During functional activity Standing balance-Leahy Scale: Good                               Pertinent Vitals/Pain Pain Assessment: No/denies pain    Home Living Family/patient expects to be discharged to:: Private residence Living Arrangements: Spouse/significant other Available Help at Discharge: Family Type of Home: House Home Access: Stairs to enter Entrance Stairs-Rails: Right Entrance Stairs-Number of Steps: 2 Home Layout: One level Home Equipment: None      Prior Function Level of Independence: Independent         Comments: Works in [employer].     Hand Dominance   Dominant Hand: Right    Extremity/Trunk Assessment   Upper Extremity Assessment Upper Extremity Assessment: Defer to OT evaluation    Lower Extremity Assessment Lower Extremity Assessment: Overall WFL for tasks assessed       Communication   Communication: No difficulties  Cognition Arousal/Alertness: Awake/alert Behavior During Therapy: WFL for tasks assessed/performed Overall Cognitive Status: Within Functional Limits for tasks assessed                 General Comments: Seems to have some delayed processing- but appears baseline.    General Comments General comments (skin integrity, edema, etc.):  Wife present during session.    Exercises     Assessment/Plan    PT Assessment Patent does not need any further PT services  PT Problem List            PT Treatment Interventions      PT Goals (Current goals can be found in the Care Plan section)  Acute Rehab PT Goals Patient Stated Goal: to go home PT Goal Formulation: All assessment and  education complete, DC therapy Potential to Achieve Goals: Good    Frequency     Barriers to discharge        Co-evaluation               End of Session Equipment Utilized During Treatment: Gait belt Activity Tolerance: Patient tolerated treatment well Patient left: in chair;with call bell/phone within reach;with family/visitor present Nurse Communication: Mobility status         Time: [id number] PT Time Calculation (min) (ACUTE ONLY): 16 min   Charges:   PT Evaluation $PT Eval Moderate Complexity: 1 Procedure     PT G Codes:        Millie Shorb A Ramon Brant 02/06/2016, 11:00 AM Mylo RedShauna Knoxx Boeding, PT, DPT [phone]

## 2016-02-06 NOTE — Progress Notes (Signed)
Dc instructions given to patient and wife at this time.  Verbalized understanding of all instructions.  No s/s of any acute distress at dc.  Pt ambulated on foot to vehicle, accompanied by nursing staff and family.

## 2016-02-06 NOTE — Discharge Instructions (Signed)
TAKE ASPIRIN 325 MG ONCE A DAY  TAKE ATORVASTATIN 40 MG ONCE A DAY  TAKE METOPROLOL 50 MG ONCE A DAY  PLEASE FOLLOW UP WITH YOUR PRIMARY CARE DOCTOR IN 1-2 WEEKS FOR HOSPITAL FOLLOW UP  PLEASE FOLLOW UP WITH NEUROLOGY WITH DR. Roda ShuttersXU- WE WILL CALL YOU WITH AN APPOINTMENT

## 2016-02-20 ENCOUNTER — Other Ambulatory Visit: Payer: Self-pay | Admitting: *Deleted

## 2016-02-20 NOTE — Patient Outreach (Signed)
[facility] Care Management  02/20/2016  Frank Luna [birth year] [id number]  EMMI-Stroke referral-red dashboard for lost interest in things; exposed to smoke.  Admission 1217-02/06/2016 -dx  Acute ischemic stroke  Telephone call to patient who was advised of reason for call. HIPPA verification received from patient. Patient states he is not sad today. States he feels comfortable talking to his primary care  doctor if he needs to. Depression screening completed as noted-PHQ2 score 1.  Patient states he smokes daily but has cut down to 2-3 cigarettes daily from 2 packs daily in 2 months. States he had been a heavy smoker for 15 years. Voices that he is stopping on his own & not taking medications to help him stop. States he made his mind up that he needed to quit and started working on it. States he has educational information on how to stop & has read it. Patient understands that smoking increases his risk of having more strokes.   Patient knows signs of stroke & plans to call 911 if they occur. Patient states he is taking medications as instructed. States spouse is making follow up appointment with primary care for him. States office was closed for holidays. Patient was reminded that he also needed to make appointments with stroke clinic-Nurse Practitioner & neurologist. States he has number on discharge papers & plans to make appointments.  EMMI-call completed. Case closed/sent to care management assistant. Frank CanLinda Verenise Moulin, RN BSN CCM Care Management Coordinator [facility] Care [facility]  [phone]

## 2016-03-20 ENCOUNTER — Ambulatory Visit: Payer: Self-pay | Admitting: Nurse Practitioner

## 2016-05-17 ENCOUNTER — Other Ambulatory Visit: Payer: Self-pay

## 2016-05-17 NOTE — Patient Outreach (Signed)
First attempt to obtain mRS. No answer. No voice mail setup to leave message for returned call.

## 2016-05-20 ENCOUNTER — Other Ambulatory Visit: Payer: Self-pay

## 2016-05-20 NOTE — Patient Outreach (Signed)
Telephone outreach to patient to obtain mRS was successfully completed. mRS = 2 

## 2018-02-14 ENCOUNTER — Emergency Department (HOSPITAL_COMMUNITY): Payer: Medicaid Other

## 2018-02-14 ENCOUNTER — Other Ambulatory Visit: Payer: Self-pay

## 2018-02-14 ENCOUNTER — Emergency Department (HOSPITAL_COMMUNITY)
Admission: EM | Admit: 2018-02-14 | Discharge: 2018-02-14 | Disposition: A | Discharge status: Home / Self Care | Payer: Medicaid Other | Attending: Emergency Medicine | Admitting: Emergency Medicine

## 2018-02-14 ENCOUNTER — Encounter (HOSPITAL_COMMUNITY): Payer: Self-pay | Admitting: Emergency Medicine

## 2018-02-14 DIAGNOSIS — I10 Essential (primary) hypertension: Secondary | ICD-10-CM | POA: Diagnosis not present

## 2018-02-14 DIAGNOSIS — R05 Cough: Secondary | ICD-10-CM | POA: Diagnosis present

## 2018-02-14 DIAGNOSIS — R7303 Prediabetes: Secondary | ICD-10-CM | POA: Diagnosis not present

## 2018-02-14 DIAGNOSIS — Z8673 Personal history of transient ischemic attack (TIA), and cerebral infarction without residual deficits: Secondary | ICD-10-CM | POA: Insufficient documentation

## 2018-02-14 DIAGNOSIS — J209 Acute bronchitis, unspecified: Secondary | ICD-10-CM | POA: Diagnosis not present

## 2018-02-14 DIAGNOSIS — Z7982 Long term (current) use of aspirin: Secondary | ICD-10-CM | POA: Insufficient documentation

## 2018-02-14 DIAGNOSIS — Z79899 Other long term (current) drug therapy: Secondary | ICD-10-CM | POA: Diagnosis not present

## 2018-02-14 DIAGNOSIS — F17210 Nicotine dependence, cigarettes, uncomplicated: Secondary | ICD-10-CM | POA: Diagnosis not present

## 2018-02-14 MED ORDER — AZITHROMYCIN 250 MG PO TABS: 250.0000 mg | ORAL_TABLET | Freq: Every day | ORAL | 0 refills | Status: DC

## 2018-02-14 MED ORDER — ALBUTEROL SULFATE HFA 108 (90 BASE) MCG/ACT IN AERS
2.0000 | INHALATION_SPRAY | Freq: Once | RESPIRATORY_TRACT | Status: AC
  Administered 2018-02-14: 2 via RESPIRATORY_TRACT
  Filled 2018-02-14: qty 6.7

## 2018-02-14 MED ORDER — PREDNISONE 50 MG PO TABS
60.0000 mg | ORAL_TABLET | Freq: Once | ORAL | Status: AC
  Administered 2018-02-14: 19:00:00 60 mg via ORAL
  Filled 2018-02-14: qty 1

## 2018-02-14 MED ORDER — PREDNISONE 50 MG PO TABS: ORAL_TABLET | ORAL | 0 refills | Status: DC

## 2018-02-14 MED ORDER — ALBUTEROL SULFATE (2.5 MG/3ML) 0.083% IN NEBU
2.5000 mg | INHALATION_SOLUTION | Freq: Once | RESPIRATORY_TRACT | Status: AC
Start: 2018-02-14 — End: 2018-02-14
  Administered 2018-02-14: 2.5 mg via RESPIRATORY_TRACT
  Filled 2018-02-14: qty 3

## 2018-02-14 MED ORDER — IPRATROPIUM-ALBUTEROL 0.5-2.5 (3) MG/3ML IN SOLN
3.0000 mL | Freq: Once | RESPIRATORY_TRACT | Status: AC
  Administered 2018-02-14: 3 mL via RESPIRATORY_TRACT
  Filled 2018-02-14: qty 3

## 2018-02-14 NOTE — ED Provider Notes (Signed)
[facility] EMERGENCY DEPARTMENT Provider Note   CSN: [id number] Arrival date & time: 02/14/18  1723     History   Chief Complaint Chief Complaint  Patient presents with  . Cough    HPI Frank Luna is a 57 y.o. male with a past medical history as outlined below, also significant for a 80+ pack year cigarette history, although states he is currently only smoking 1 pack/day presenting with a one-week history URI type symptoms including generalized body aches and chills, cough which has been productive of a clear to yellow sputum, he denies nausea, vomiting, fevers and also denies chest pain or shortness of breath.  His wife endorses hearing wheezing, predominantly at night, but states this is not a new finding.  He has taken Tylenol and Robitussin with no significant relief of his symptoms.  The history is provided by the patient and the spouse.    Past Medical History:  Diagnosis Date  . Hypertension   . Stroke [facility])     Patient Active Problem List   Diagnosis Date Noted  . Hyperlipidemia 02/06/2016  . Cognitive impairment 02/06/2016  . Morbid obesity (HCC) 02/06/2016  . Pre-diabetes 02/06/2016  . History of CVA (cerebrovascular accident) 02/06/2016  . Speech abnormality   . Smoker   . Essential hypertension   . Acute ischemic stroke (HCC) 02/04/2016    History reviewed. No pertinent surgical history.      Home Medications    Prior to Admission medications   Medication Sig Start Date End Date Taking? Authorizing Provider  acetaminophen (TYLENOL) 500 MG tablet Take 1,000 mg by mouth every 6 (six) hours as needed for headache (pain).    [provider]  aspirin EC 325 MG EC tablet Take 1 tablet (325 mg total) by mouth daily. 02/07/16   Burns, Tinnie Gens, MD  atorvastatin (LIPITOR) 40 MG tablet Take 1 tablet (40 mg total) by mouth daily at 6 PM. 02/06/16   Burns, Tinnie Gens, MD  azithromycin (ZITHROMAX) 250 MG tablet Take 1 tablet (250 mg total) by mouth daily.  Take first 2 tablets together, then 1 every day until finished. 02/14/18   Burgess Amor, PA-C  metoprolol succinate (TOPROL-XL) 50 MG 24 hr tablet Take 1 tablet (50 mg total) by mouth daily. Take with or immediately following a meal. 02/06/16   Burns, Alexa R, MD  predniSONE (DELTASONE) 50 MG tablet Take 1 tablet daily for 4 days. 02/14/18   Burgess Amor, PA-C    Family History No family history on file.  Social History Social History   Tobacco Use  . Smoking status: Current Every Day Smoker    Packs/day: 1.00    Types: Cigarettes  . Smokeless tobacco: Never Used  Substance Use Topics  . Alcohol use: No  . Drug use: No     Allergies   Penicillins   Review of Systems Review of Systems  Constitutional: Negative for chills and fever.  HENT: Positive for congestion. Negative for ear pain, rhinorrhea, sinus pressure, sore throat, trouble swallowing and voice change.   Eyes: Negative for discharge.  Respiratory: Positive for cough and wheezing. Negative for shortness of breath and stridor.   Cardiovascular: Negative for chest pain, palpitations and leg swelling.  Gastrointestinal: Negative for abdominal pain, nausea and vomiting.  Genitourinary: Negative.   Musculoskeletal: Negative for myalgias.     Physical Exam Updated Vital Signs BP 136/78 (BP Location: Right Arm)   Pulse 78   Temp 98.5 F (36.9 C) (Oral)  Resp 20   Ht 5\' 7"  (1.702 m)   Wt 99.8 kg   SpO2 95%   BMI 34.46 kg/m   Physical Exam Constitutional:      Appearance: He is well-developed.  HENT:     Head: Normocephalic and atraumatic.     Right Ear: Tympanic membrane and ear canal normal.     Left Ear: Tympanic membrane and ear canal normal.     Nose: Mucosal edema and rhinorrhea present.     Mouth/Throat:     Pharynx: Uvula midline. No oropharyngeal exudate or posterior oropharyngeal erythema.     Tonsils: No tonsillar abscesses.  Eyes:     Conjunctiva/sclera: Conjunctivae normal.  Cardiovascular:       Rate and Rhythm: Normal rate and regular rhythm.     Heart sounds: Normal heart sounds.  Pulmonary:     Effort: Pulmonary effort is normal. No respiratory distress.     Breath sounds: Wheezing present. No rhonchi or rales.     Comments: Reduced breath sounds throughout all lung fields with prolonged expirations.  Patient is wheezing in all lung fields. Abdominal:     Palpations: Abdomen is soft.     Tenderness: There is no abdominal tenderness.  Musculoskeletal: Normal range of motion.  Skin:    General: Skin is warm and dry.     Findings: No rash.  Neurological:     Mental Status: He is alert and oriented to person, place, and time.      ED Treatments / Results  Labs (all labs ordered are listed, but only abnormal results are displayed) Labs Reviewed - No data to display  EKG None  Radiology Dg Chest 2 View  Result Date: 02/14/2018 CLINICAL DATA:  Cough. EXAM: CHEST - 2 VIEW COMPARISON:  Radiographs of January 25, 2012. FINDINGS: The heart size and mediastinal contours are within normal limits. Both lungs are clear. The visualized skeletal structures are unremarkable. IMPRESSION: No active cardiopulmonary disease. Electronically Signed   By: Lupita RaiderJames  Green Jr, M.D.   On: 02/14/2018 18:36    Procedures Procedures (including critical care time)  Medications Ordered in ED Medications  albuterol (PROVENTIL HFA;VENTOLIN HFA) 108 (90 Base) MCG/ACT inhaler 2 puff (has no administration in time range)  ipratropium-albuterol (DUONEB) 0.5-2.5 (3) MG/3ML nebulizer solution 3 mL (3 mLs Nebulization Given 02/14/18 1908)  albuterol (PROVENTIL) (2.5 MG/3ML) 0.083% nebulizer solution 2.5 mg (2.5 mg Nebulization Given 02/14/18 1908)  predniSONE (DELTASONE) tablet 60 mg (60 mg Oral Given 02/14/18 1858)     Initial Impression / Assessment and Plan / ED Course  I have reviewed the triage vital signs and the nursing notes.  Pertinent labs & imaging results that were available during  my care of the patient were reviewed by me and considered in my medical decision making (see chart for details).     Patient was given an albuterol and Atrovent nebulizer treatment here in addition to prednisone.  He felt much improved, stating his cough was much less prominent since this treatment.  At reexam he has better aeration, still has some wheezing is bilateral bases but much improved.  Discussed home treatment including continued albuterol and was given an MDI for this.  Additionally he was prescribed prednisone pulse dosing for the next 4 days, although he states that this medicine tasted terrible and he was not going to take anymore, I strongly encouraged him to finish the course.  Given his significant tobacco history of also placed him on a Z-Pak.  Return  precautions discussed.  Final Clinical Impressions(s) / ED Diagnoses   Final diagnoses:  Bronchospasm with bronchitis, acute    ED Discharge Orders         Ordered    azithromycin (ZITHROMAX) 250 MG tablet  Daily     02/14/18 2016    predniSONE (DELTASONE) 50 MG tablet     02/14/18 2016           Burgess Amordol, Carmalita Wakefield, PA-C 02/14/18 2023    Gerhard MunchLockwood, Robert, MD 02/14/18 2351

## 2018-02-14 NOTE — ED Triage Notes (Signed)
Patient c/o productive cough with body aches and chills x1 week. Patient states sputum thick, unsure of color. Denies any nausea, vomiting, diarrhea, or fevers. Patient reports taking tylenol and robitussin with no relief. Patient states last took tylenol prior to coming to ED.

## 2018-02-14 NOTE — ED Notes (Signed)
Respiratory paged at this time for tx.  

## 2018-02-14 NOTE — Discharge Instructions (Addendum)
Take the entire course of the antibiotics prescribed.  You have also been prescribed prednisone.  You were given your first dose of this this evening, therefore you do not need the next dose of this until tomorrow evening.  Use the inhaler given you taking 2 puffs every 4 hours if you are coughing or wheezing.  Get rechecked for any worsening symptoms including fevers, shortness of breath or weakness.

## 2019-06-08 ENCOUNTER — Emergency Department (HOSPITAL_COMMUNITY)
Admission: EM | Admit: 2019-06-08 | Discharge: 2019-06-08 | Discharge status: Against Medical Advice | Payer: Medicaid Other | Attending: Emergency Medicine | Admitting: Emergency Medicine

## 2019-06-08 ENCOUNTER — Emergency Department (HOSPITAL_COMMUNITY): Payer: Medicaid Other

## 2019-06-08 ENCOUNTER — Encounter (HOSPITAL_COMMUNITY): Payer: Self-pay

## 2019-06-08 ENCOUNTER — Other Ambulatory Visit: Payer: Self-pay

## 2019-06-08 DIAGNOSIS — I10 Essential (primary) hypertension: Secondary | ICD-10-CM | POA: Insufficient documentation

## 2019-06-08 DIAGNOSIS — F17210 Nicotine dependence, cigarettes, uncomplicated: Secondary | ICD-10-CM | POA: Insufficient documentation

## 2019-06-08 DIAGNOSIS — Z79899 Other long term (current) drug therapy: Secondary | ICD-10-CM | POA: Insufficient documentation

## 2019-06-08 DIAGNOSIS — Z7901 Long term (current) use of anticoagulants: Secondary | ICD-10-CM | POA: Diagnosis not present

## 2019-06-08 DIAGNOSIS — Z7982 Long term (current) use of aspirin: Secondary | ICD-10-CM | POA: Insufficient documentation

## 2019-06-08 DIAGNOSIS — G459 Transient cerebral ischemic attack, unspecified: Secondary | ICD-10-CM

## 2019-06-08 DIAGNOSIS — M791 Myalgia, unspecified site: Secondary | ICD-10-CM | POA: Diagnosis present

## 2019-06-08 LAB — DIFFERENTIAL
Abs Immature Granulocytes: 0.02 10*3/uL (ref 0.00–0.07)
Basophils Absolute: 0.1 10*3/uL (ref 0.0–0.1)
Basophils Relative: 1 %
Eosinophils Absolute: 0.3 10*3/uL (ref 0.0–0.5)
Eosinophils Relative: 4 %
Immature Granulocytes: 0 %
Lymphocytes Relative: 22 %
Lymphs Abs: 1.8 10*3/uL (ref 0.7–4.0)
Monocytes Absolute: 0.6 10*3/uL (ref 0.1–1.0)
Monocytes Relative: 7 %
Neutro Abs: 5.3 10*3/uL (ref 1.7–7.7)
Neutrophils Relative %: 66 %

## 2019-06-08 LAB — CBC
HCT: 50.2 % (ref 39.0–52.0)
Hemoglobin: 17 g/dL (ref 13.0–17.0)
MCH: 30 pg (ref 26.0–34.0)
MCHC: 33.9 g/dL (ref 30.0–36.0)
MCV: 88.7 fL (ref 80.0–100.0)
Platelets: 102 10*3/uL — ABNORMAL LOW (ref 150–400)
RBC: 5.66 MIL/uL (ref 4.22–5.81)
RDW: 13.2 % (ref 11.5–15.5)
WBC: 8 10*3/uL (ref 4.0–10.5)
nRBC: 0 % (ref 0.0–0.2)

## 2019-06-08 LAB — PROTIME-INR
INR: 1.1 (ref 0.8–1.2)
Prothrombin Time: 13.7 seconds (ref 11.4–15.2)

## 2019-06-08 LAB — I-STAT CHEM 8, ED
BUN: 11 mg/dL (ref 6–20)
Calcium, Ion: 1.2 mmol/L (ref 1.15–1.40)
Chloride: 98 mmol/L (ref 98–111)
Creatinine, Ser: 0.9 mg/dL (ref 0.61–1.24)
Glucose, Bld: 149 mg/dL — ABNORMAL HIGH (ref 70–99)
HCT: 49 % (ref 39.0–52.0)
Hemoglobin: 16.7 g/dL (ref 13.0–17.0)
Potassium: 3.9 mmol/L (ref 3.5–5.1)
Sodium: 139 mmol/L (ref 135–145)
TCO2: 31 mmol/L (ref 22–32)

## 2019-06-08 LAB — COMPREHENSIVE METABOLIC PANEL
ALT: 54 U/L — ABNORMAL HIGH (ref 0–44)
AST: 28 U/L (ref 15–41)
Albumin: 4 g/dL (ref 3.5–5.0)
Alkaline Phosphatase: 81 U/L (ref 38–126)
Anion gap: 10 (ref 5–15)
BUN: 12 mg/dL (ref 6–20)
CO2: 27 mmol/L (ref 22–32)
Calcium: 9 mg/dL (ref 8.9–10.3)
Chloride: 100 mmol/L (ref 98–111)
Creatinine, Ser: 0.94 mg/dL (ref 0.61–1.24)
GFR calc Af Amer: >60 mL/min (ref >60)
GFR calc non Af Amer: >60 mL/min (ref >60)
Glucose, Bld: 151 mg/dL — ABNORMAL HIGH (ref 70–99)
Potassium: 3.8 mmol/L (ref 3.5–5.1)
Sodium: 137 mmol/L (ref 135–145)
Total Bilirubin: 0.7 mg/dL (ref 0.3–1.2)
Total Protein: 7.1 g/dL (ref 6.5–8.1)

## 2019-06-08 LAB — ETHANOL: Alcohol, Ethyl (B): <10 mg/dL (ref <10)

## 2019-06-08 LAB — APTT: aPTT: 31 seconds (ref 24–36)

## 2019-06-08 LAB — CBG MONITORING, ED: Glucose-Capillary: 147 mg/dL — ABNORMAL HIGH (ref 70–99)

## 2019-06-08 MED ORDER — CLOPIDOGREL BISULFATE 75 MG PO TABS
75.0000 mg | ORAL_TABLET | Freq: Once | ORAL | Status: AC
  Administered 2019-06-08: 75 mg via ORAL
  Filled 2019-06-08: qty 1

## 2019-06-08 MED ORDER — CLOPIDOGREL BISULFATE 75 MG PO TABS: 75.0000 mg | ORAL_TABLET | Freq: Every day | ORAL | 1 refills | Status: DC

## 2019-06-08 MED ORDER — IOHEXOL 350 MG/ML SOLN
100.0000 mL | Freq: Once | INTRAVENOUS | Status: AC | PRN
  Administered 2019-06-08: 100 mL via INTRAVENOUS

## 2019-06-08 NOTE — ED Notes (Signed)
Code stroke activated, consulting with video neuro triage.

## 2019-06-08 NOTE — Consult Note (Signed)
[facility] Tele[facility]urology Consult Services   Date of Service:   06/08/2019 18:19:14  Impression:     .  G45.9 - Transient cerebral ischemic attack, unspecified  Comments/Sign-Out: Pt with transient speech disturbance and worsened weakness (unclear which side), now resolved. Recommend addition of plavix 75 mg to ASA 81 mg and admission with MRI brain, TTE, telemetry.  Metrics: Last Known Well: 06/08/2019 10:00:00 [facility] Notification Time: 06/08/2019 18:19:14 Arrival Time: 06/08/2019 17:54:00 Stamp Time: 06/08/2019 18:19:14 Time First Login Attempt: 06/08/2019 18:21:38 Symptoms: speech changes, weakness NIHSS Start Assessment Time: 06/08/2019 18:45:48 Patient is not a candidate for Alteplase/Activase. Alteplase Medical Decision: 06/08/2019 18:26:48 Patient was not deemed candidate for Alteplase/Activase thrombolytics because of Last Well Known Above 4.5 Hours.  CT head showed no acute hemorrhage or acute core infarct. CT head was reviewed.  Lower Likelihood of Large Vessel Occlusion but Following Stat Studies are Recommended  CTA Head and Neck. CT Perfusion. Reviewed, No Indication of Large Vessel Occlusive Thrombus on my review, Patient is not an NIR Candidate.   ED Physician notified of diagnostic impression and management plan on 06/08/2019 19:00:09  Our recommendations are outlined below.  Recommendations:     .  Activate Stroke Protocol Admission/Order Set     .  Stroke/Telemetry Floor     .  Neuro Checks     .  Bedside Swallow Eval     .  DVT Prophylaxis     .  IV Fluids, Normal Saline     .  Head of Bed 30 Degrees     .  Euglycemia and Avoid Hyperthermia (PRN Acetaminophen)     .  Initiate Aspirin 81 MG Daily     .  Initiate Plavix 75 MG Daily     .  Recommend plavix 75 and ASA 81 mg x 21 days then ASA daily (stressing compliance) and admission with MRI brain, TTE, telemetry.  Routine Consultation with [facility] for Follow  up Care  Sign Out:     .  Discussed with Emergency Department Provider    ------------------------------------------------------------------------------  History of Present Illness: Patient is a 59 year old Male.  Patient was brought by EMS for symptoms of speech changes, weakness  Pt with two prior strokes was noted by his wife to be aphasic and have repetitive speech, more left sided weakness. He had prior left corona radiata infarct 2017 which presented with aphasia. He states he has not been taking his aspirin "It's been a while". But did take one today.   Past Medical History:     . Hypertension     . Diabetes Mellitus     . Hyperlipidemia     . Stroke     . There is NO history of Atrial Fibrillation     . There is NO history of Coronary Artery Disease  Anticoagulant use:  No  Antiplatelet use: asa (prescribed, but not taking)    Examination: BP(175/84), Pulse(67), Blood Glucose(147) 1A: Level of Consciousness - Alert; keenly responsive + 0 1B: Ask Month and Age - Both Questions Right + 0 1C: Blink Eyes & Squeeze Hands - Performs Both Tasks + 0 2: Test Horizontal Extraocular Movements - Normal + 0 3: Test Visual Fields - No Visual Loss + 0 4: Test Facial Palsy (Use Grimace if Obtunded) - Normal symmetry + 0 5A: Test Left Arm Motor Drift - No Drift for 10 Seconds + 0 5B: Test Right Arm Motor Drift - No Drift for 10 Seconds + 0  6A: Test Left Leg Motor Drift - No Drift for 5 Seconds + 0 6B: Test Right Leg Motor Drift - No Drift for 5 Seconds + 0 7: Test Limb Ataxia (FNF/Heel-Shin) - No Ataxia + 0 8: Test Sensation - Normal; No sensory loss + 0 9: Test Language/Aphasia - Normal; No aphasia + 0 10: Test Dysarthria - Normal + 0 11: Test Extinction/Inattention - No abnormality + 0  NIHSS Score: 0  Pre-Morbid Modified Ranking Scale: 1 Points = No significant disability despite symptoms; able to carry out all usual duties and activities   Patient/Family was informed  the Neurology Consult would occur via TeleHealth consult by way of interactive audio and video telecommunications and consented to receiving care in this manner.     Dr Rutherford Guys   [facility] [phone]  Case [id number]

## 2019-06-08 NOTE — ED Notes (Signed)
eval with md fowler complete.  Code stroke canceled.

## 2019-06-08 NOTE — Discharge Instructions (Signed)
You may follow-up with your family doctor at your soonest convenience, I would strongly recommend that you follow-up within the next couple of days.  I will start you on a new medication called Plavix which you need to take every single day.  Do not miss any doses and please take it with your baby aspirin every day.  I have strongly recommended and insisted that you be admitted to the hospital but you have refused, you are welcome to return at any time should you change your mind or should your symptoms worsen.  We are here 24 hours a day and 7 days a week to help you, we will do the right thing and admit you to the hospital which is what I want to do this evening but you are refusing.

## 2019-06-08 NOTE — ED Triage Notes (Addendum)
Pt to er room number 11, pt states that he feels bad, asked pt is he had weakness, chest pain, shortness of breath, pt states that he just feels bad. Pt oriented to place and person, disoriented to time, reoriented pt.  Last known well according to pts wife was 10am.  MD Hyacinth Meeker at bedside.  Wife says hx of two strokes.

## 2019-06-08 NOTE — ED Provider Notes (Addendum)
[facility] EMERGENCY DEPARTMENT Provider Note   CSN: [id number] Arrival date & time: 06/08/19  1754  An emergency department physician performed an initial assessment on this suspected stroke patient at 1810.  History Chief Complaint  Patient presents with  . Generalized Body Aches    Frank Luna is a 59 y.o. male.  HPI   This patient is a 59 year old male, history of stroke and hypertension as well as hyperlipidemia.  The patient has a reported history of a cognitive impairment.  His wife is the primary historian is the patient presents with a level 5 caveat due to his expressive aphasia.  I have personally reviewed the medical record and found that in December 2017 the patient had an acute stroke, he had multiple chronic lacunar infarcts as well as a small acute infarct in the left corona radiata.  He presents to the hospital today with difficulty speaking as well as difficulty using his hand.  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  He returned home, she next saw him around 230 when she got home and noticed that he was completely unable to say anything mumbling the same and audible mumbled jumbo over and over in response to questions asked.  He also had a clumsy hand and was unable to put a drink back into the fridge.  She finally convinced him to come to the hospital.  She states that he is gradually improved and is now able to speak a little better but still having significant difficulty answering questions.  There is no difficulty with gait, there is no seizure, he denies headache.  Past Medical History:  Diagnosis Date  . Hypertension   . Stroke [facility])     Patient Active Problem List   Diagnosis Date Noted  . Hyperlipidemia 02/06/2016  . Cognitive impairment 02/06/2016  . Morbid obesity (HCC) 02/06/2016  . Pre-diabetes 02/06/2016  . History of CVA  (cerebrovascular accident) 02/06/2016  . Speech abnormality   . Smoker   . Essential hypertension   . Acute ischemic stroke (HCC) 02/04/2016    History reviewed. No pertinent surgical history.     History reviewed. No pertinent family history.  Social History   Tobacco Use  . Smoking status: Current Every Day Smoker    Packs/day: 2.00    Types: Cigarettes  . Smokeless tobacco: Never Used  Substance Use Topics  . Alcohol use: No  . Drug use: No    Home Medications Prior to Admission medications   Medication Sig Start Date End Date Taking? Authorizing Provider  atorvastatin (LIPITOR) 40 MG tablet Take 1 tablet (40 mg total) by mouth daily at 6 PM. 02/06/16  Yes Burns, Alexa R, MD  dapagliflozin propanediol (FARXIGA) 10 MG TABS tablet Take 10 mg by mouth daily.   Yes [provider]  glipiZIDE-metformin (METAGLIP) 5-500 MG tablet Take 2 tablets by mouth 2 (two) times daily before a meal.   Yes [provider]  metoprolol succinate (TOPROL-XL) 50 MG 24 hr tablet Take 1 tablet (50 mg total) by mouth daily. Take with or immediately following a meal. Patient taking differently: Take 75 mg by mouth daily. Take with or immediately following a meal.  02/06/16  Yes Burns, Alexa R, MD  acetaminophen (TYLENOL) 500 MG tablet Take 1,000 mg by mouth every 6 (six) hours as needed for headache (pain).    [provider]  aspirin EC 325 MG EC tablet Take 1 tablet (325 mg total) by mouth daily. 02/07/16   Burns, Tinnie Gens, MD  clopidogrel (PLAVIX) 75 MG tablet Take 1 tablet (75 mg total) by mouth daily. 06/08/19   Eber Hong, MD    Allergies    Penicillins  Review of Systems   Review of Systems  Unable to perform ROS: Acuity of condition    Physical Exam Updated Vital Signs BP (!) 179/83   Pulse 74   Temp 98.1 F (36.7 C) (Oral)   Resp 20   Ht 1.753 m (5\' 9" )   Wt 85.7 kg   SpO2 97%   BMI 27.91 kg/m   Physical Exam Vitals and nursing note reviewed.    Constitutional:      General: He is in acute distress.     Appearance: He is well-developed.  HENT:     Head: Normocephalic and atraumatic.     Mouth/Throat:     Mouth: Mucous membranes are moist.     Pharynx: No oropharyngeal exudate.     Comments: Very few teeth remaining Eyes:     General: No scleral icterus.       Right eye: No discharge.        Left eye: No discharge.     Conjunctiva/sclera: Conjunctivae normal.     Pupils: Pupils are equal, round, and reactive to light.  Neck:     Thyroid: No thyromegaly.     Vascular: No JVD.  Cardiovascular:     Rate and Rhythm: Normal rate and regular rhythm.     Heart sounds: Normal heart sounds. No murmur. No friction rub. No gallop.   Pulmonary:     Effort: Pulmonary effort is normal. No respiratory distress.     Breath sounds: Normal breath sounds. No wheezing or rales.  Abdominal:     General: Bowel sounds are normal. There is no distension.     Palpations: Abdomen is soft. There is no mass.     Tenderness: There is no abdominal tenderness.  Musculoskeletal:        General: No tenderness. Normal range of motion.     Cervical back: Normal range of motion and neck supple.  Lymphadenopathy:     Cervical: No cervical adenopathy.  Skin:    General: Skin is warm and dry.     Findings: No erythema or rash.  Neurological:     Mental Status: He is alert.     Coordination: Coordination normal.     Comments: The patient has difficulty following simple commands, he can occasionally grip or raise the leg or arm and appears to have symmetrical strength bilaterally.  He can perform finger-nose-finger.  When I asked him to show me 2 fingers he cannot do this.  When I asked him how many fingers he has he says 5.  He has a subtle facial droop on the left.  He is able to cross the midline with his eyes bilaterally, he can detect sensation bilaterally, there is no extinction.  Psychiatric:        Behavior: Behavior normal.     ED Results /  Procedures / Treatments   Labs (all labs ordered are listed, but only abnormal results are displayed) Labs Reviewed  CBG MONITORING, ED - Abnormal; Notable for the following components:      Result Value   Glucose-Capillary 147 (*)    All other components within normal limits  I-STAT CHEM 8, ED - Abnormal; Notable  for the following components:   Glucose, Bld 149 (*)    All other components within normal limits  PROTIME-INR  APTT  ETHANOL  CBC  DIFFERENTIAL  COMPREHENSIVE METABOLIC PANEL  RAPID URINE DRUG SCREEN, HOSP PERFORMED  URINALYSIS, ROUTINE W REFLEX MICROSCOPIC    EKG EKG Interpretation  Date/Time:  Tuesday June 08 2019 18:49:01 EDT Ventricular Rate:  80 PR Interval:    QRS Duration: 120 QT Interval:  418 QTC Calculation: 483 R Axis:   95 Text Interpretation: Sinus rhythm Nonspecific intraventricular conduction delay Probable lateral infarct, old since last tracing no significant change Confirmed by Eber Hong ([id number]) on 06/08/2019 7:07:50 PM   Radiology No results found.  Procedures Procedures (including critical care time)  Medications Ordered in ED Medications  clopidogrel (PLAVIX) tablet 75 mg (has no administration in time range)  iohexol (OMNIPAQUE) 350 MG/ML injection 100 mL (100 mLs Intravenous Contrast Given 06/08/19 1828)    ED Course  I have reviewed the triage vital signs and the nursing notes.  Pertinent labs & imaging results that were available during my care of the patient were reviewed by me and considered in my medical decision making (see chart for details).    MDM Rules/Calculators/A&P                      The patient has difficulty repeating phrases, he has difficulty following commands, he has expressive aphasia and is unable to give me the answer to simple questions such as his wife's birthdate which she does know the answer to.  I suspect that the patient has had another stroke.  He is approximately 8 hours from when his wife  last saw him normal, he has 4 hours since she saw him abnormal.  It is unclear the exact timing but he is outside the TPA window.   This patient presents to the ED for concern of possible stroke, this involves an extensive number of treatment options, and is a complaint that carries with it a high risk of complications and morbidity.  The differential diagnosis includes stroke, metabolic abnormality, infection, hemorrhage   Lab Tests:   I Ordered, reviewed, and interpreted labs, which included code stroke panel including metabolic panel, CBC, coagulation panel  Medicines ordered:   I ordered medication Plavix for TIA  Imaging Studies ordered:   I ordered imaging studies which included CT scan of the head without contrast as well as angiograms of the head and neck and  I independently visualized and interpreted imaging which showed no acute intracranial hemorrhage  Additional history obtained:   Additional history obtained from medical record, spouse  Previous records obtained and reviewed   Consultations Obtained:   I consulted teleneurology and discussed lab and imaging findings.  The teleneurologist believes that the patient should be admitted to the hospital for an MRI and further work-up, to initiate Plavix as well.  Reevaluation:  After the interventions stated above, I reevaluated the patient and found had improved significantly and was now speaking, now endorses that he has not been taking his aspirin though he did take it today when he started feeling poorly.  I discussed the case with the neurologist  Critical Interventions:  . CT scans of the head and neck . Consultation with teleneurology . Unfortunately the patient refused to stay, his speech was completely back to normal and he states he wants to go home to smoke.  He absolutely refuses to stay in the hospital for any other testing  and was able to very eloquently tell me in very clear terms that he understands  that he could have a worsening stroke and even die from severe symptoms.  He still wants to go home and is willing to sign out [place].  At this time on my repeat evaluation I feel like he does have medical decision-making capacity and though I do not agree with this decision I will respect his rights.  The wife was also at the bedside try to convince the patient to stay, he still refused.  Final Clinical Impression(s) / ED Diagnoses Final diagnoses:  TIA (transient ischemic attack)    Rx / DC Orders ED Discharge Orders         Ordered    clopidogrel (PLAVIX) 75 MG tablet  Daily     06/08/19 1904           Noemi Chapel, MD 06/08/19 V[place]ly [place]    Noemi Chapel, MD 06/08/19 Einar Crow

## 2019-06-08 NOTE — ED Notes (Signed)
Pt back from ct, MD Ether Griffins on teleneuro

## 2019-06-08 NOTE — ED Notes (Signed)
Pt jumped out of bed out of bed states that he wants to go home, md miller at bedside reviewing plan of care, risks of leaving ama, pt states that he wants a cigarette, md miller offered two nicotine patches, pt states that he still wants to go home, d/c pt iv times two, pt verbalized understanding risks and benefits, pt promised he would return if he started feeling poorly again.  Pt ambulatory from dpt with steady gait accompanied by wife.

## 2019-12-01 ENCOUNTER — Other Ambulatory Visit (HOSPITAL_COMMUNITY): Payer: Self-pay

## 2019-12-01 ENCOUNTER — Other Ambulatory Visit: Payer: Self-pay | Admitting: Internal Medicine

## 2019-12-01 ENCOUNTER — Other Ambulatory Visit: Payer: Self-pay

## 2019-12-01 DIAGNOSIS — R188 Other ascites: Secondary | ICD-10-CM

## 2019-12-08 ENCOUNTER — Ambulatory Visit (HOSPITAL_COMMUNITY): Payer: Medicaid Other

## 2019-12-08 ENCOUNTER — Encounter (HOSPITAL_COMMUNITY): Payer: Self-pay

## 2020-06-17 ENCOUNTER — Emergency Department (HOSPITAL_COMMUNITY): Payer: Medicaid Other

## 2020-06-17 ENCOUNTER — Observation Stay (HOSPITAL_COMMUNITY): Payer: Medicaid Other

## 2020-06-17 ENCOUNTER — Inpatient Hospital Stay (HOSPITAL_COMMUNITY)
Admission: EM | Admit: 2020-06-17 | Discharge: 2020-06-17 | DRG: 065 | Discharge status: Against Medical Advice | Payer: Medicaid Other | Attending: Family Medicine | Admitting: Family Medicine

## 2020-06-17 ENCOUNTER — Inpatient Hospital Stay (HOSPITAL_COMMUNITY)
Admission: EM | Admit: 2020-06-17 | Discharge: 2020-06-21 | DRG: 065 | Disposition: A | Discharge status: Rehabilitation Facility | Payer: Medicaid Other | Attending: Internal Medicine | Admitting: Internal Medicine

## 2020-06-17 ENCOUNTER — Other Ambulatory Visit (HOSPITAL_COMMUNITY): Payer: Self-pay

## 2020-06-17 ENCOUNTER — Other Ambulatory Visit: Payer: Self-pay

## 2020-06-17 ENCOUNTER — Encounter (HOSPITAL_COMMUNITY): Payer: Self-pay | Admitting: Emergency Medicine

## 2020-06-17 DIAGNOSIS — E663 Overweight: Secondary | ICD-10-CM | POA: Diagnosis present

## 2020-06-17 DIAGNOSIS — G8194 Hemiplegia, unspecified affecting left nondominant side: Secondary | ICD-10-CM | POA: Diagnosis present

## 2020-06-17 DIAGNOSIS — E876 Hypokalemia: Secondary | ICD-10-CM | POA: Diagnosis present

## 2020-06-17 DIAGNOSIS — Z880 Allergy status to penicillin: Secondary | ICD-10-CM

## 2020-06-17 DIAGNOSIS — I63031 Cerebral infarction due to thrombosis of right carotid artery: Secondary | ICD-10-CM

## 2020-06-17 DIAGNOSIS — Z7902 Long term (current) use of antithrombotics/antiplatelets: Secondary | ICD-10-CM

## 2020-06-17 DIAGNOSIS — Z79899 Other long term (current) drug therapy: Secondary | ICD-10-CM

## 2020-06-17 DIAGNOSIS — E1165 Type 2 diabetes mellitus with hyperglycemia: Secondary | ICD-10-CM | POA: Diagnosis present

## 2020-06-17 DIAGNOSIS — E119 Type 2 diabetes mellitus without complications: Secondary | ICD-10-CM | POA: Diagnosis present

## 2020-06-17 DIAGNOSIS — R29705 NIHSS score 5: Secondary | ICD-10-CM | POA: Diagnosis present

## 2020-06-17 DIAGNOSIS — R7303 Prediabetes: Secondary | ICD-10-CM | POA: Diagnosis present

## 2020-06-17 DIAGNOSIS — I639 Cerebral infarction, unspecified: Principal | ICD-10-CM | POA: Diagnosis present

## 2020-06-17 DIAGNOSIS — F17210 Nicotine dependence, cigarettes, uncomplicated: Secondary | ICD-10-CM | POA: Diagnosis present

## 2020-06-17 DIAGNOSIS — W19XXXA Unspecified fall, initial encounter: Secondary | ICD-10-CM

## 2020-06-17 DIAGNOSIS — E785 Hyperlipidemia, unspecified: Secondary | ICD-10-CM | POA: Diagnosis present

## 2020-06-17 DIAGNOSIS — H6982 Other specified disorders of Eustachian tube, left ear: Secondary | ICD-10-CM | POA: Diagnosis present

## 2020-06-17 DIAGNOSIS — Z7982 Long term (current) use of aspirin: Secondary | ICD-10-CM

## 2020-06-17 DIAGNOSIS — I10 Essential (primary) hypertension: Secondary | ICD-10-CM | POA: Diagnosis present

## 2020-06-17 DIAGNOSIS — Z794 Long term (current) use of insulin: Secondary | ICD-10-CM

## 2020-06-17 DIAGNOSIS — W1830XA Fall on same level, unspecified, initial encounter: Secondary | ICD-10-CM | POA: Diagnosis not present

## 2020-06-17 DIAGNOSIS — Z7984 Long term (current) use of oral hypoglycemic drugs: Secondary | ICD-10-CM

## 2020-06-17 DIAGNOSIS — Y92230 Patient room in hospital as the place of occurrence of the external cause: Secondary | ICD-10-CM | POA: Diagnosis not present

## 2020-06-17 DIAGNOSIS — Z8673 Personal history of transient ischemic attack (TIA), and cerebral infarction without residual deficits: Secondary | ICD-10-CM

## 2020-06-17 DIAGNOSIS — K5900 Constipation, unspecified: Secondary | ICD-10-CM | POA: Diagnosis present

## 2020-06-17 DIAGNOSIS — D6959 Other secondary thrombocytopenia: Secondary | ICD-10-CM | POA: Diagnosis present

## 2020-06-17 DIAGNOSIS — R4189 Other symptoms and signs involving cognitive functions and awareness: Secondary | ICD-10-CM | POA: Diagnosis present

## 2020-06-17 DIAGNOSIS — Z20822 Contact with and (suspected) exposure to covid-19: Secondary | ICD-10-CM | POA: Diagnosis present

## 2020-06-17 DIAGNOSIS — R29702 NIHSS score 2: Secondary | ICD-10-CM | POA: Diagnosis present

## 2020-06-17 DIAGNOSIS — R29810 Facial weakness: Secondary | ICD-10-CM | POA: Diagnosis present

## 2020-06-17 DIAGNOSIS — I700 Atherosclerosis of aorta: Secondary | ICD-10-CM | POA: Diagnosis present

## 2020-06-17 DIAGNOSIS — R471 Dysarthria and anarthria: Secondary | ICD-10-CM | POA: Diagnosis present

## 2020-06-17 DIAGNOSIS — Z6828 Body mass index (BMI) 28.0-28.9, adult: Secondary | ICD-10-CM

## 2020-06-17 DIAGNOSIS — F17200 Nicotine dependence, unspecified, uncomplicated: Secondary | ICD-10-CM | POA: Diagnosis not present

## 2020-06-17 DIAGNOSIS — R29704 NIHSS score 4: Secondary | ICD-10-CM | POA: Diagnosis not present

## 2020-06-17 DIAGNOSIS — D696 Thrombocytopenia, unspecified: Secondary | ICD-10-CM

## 2020-06-17 DIAGNOSIS — I6381 Other cerebral infarction due to occlusion or stenosis of small artery: Principal | ICD-10-CM | POA: Diagnosis present

## 2020-06-17 DIAGNOSIS — S50312A Abrasion of left elbow, initial encounter: Secondary | ICD-10-CM | POA: Diagnosis not present

## 2020-06-17 DIAGNOSIS — Z823 Family history of stroke: Secondary | ICD-10-CM

## 2020-06-17 DIAGNOSIS — R479 Unspecified speech disturbances: Secondary | ICD-10-CM | POA: Diagnosis present

## 2020-06-17 DIAGNOSIS — E559 Vitamin D deficiency, unspecified: Secondary | ICD-10-CM | POA: Diagnosis present

## 2020-06-17 HISTORY — DX: Thrombocytopenia, unspecified: D69.6

## 2020-06-17 HISTORY — DX: Type 2 diabetes mellitus without complications: E11.9

## 2020-06-17 LAB — PROTIME-INR
INR: 1.1 (ref 0.8–1.2)
Prothrombin Time: 13.7 seconds (ref 11.4–15.2)

## 2020-06-17 LAB — RAPID URINE DRUG SCREEN, HOSP PERFORMED
Amphetamines: NOT DETECTED
Barbiturates: NOT DETECTED
Benzodiazepines: NOT DETECTED
Cocaine: NOT DETECTED
Opiates: NOT DETECTED
Tetrahydrocannabinol: NOT DETECTED

## 2020-06-17 LAB — DIFFERENTIAL
Abs Immature Granulocytes: 0.01 10*3/uL (ref 0.00–0.07)
Basophils Absolute: 0.1 10*3/uL (ref 0.0–0.1)
Basophils Relative: 1 %
Eosinophils Absolute: 0.2 10*3/uL (ref 0.0–0.5)
Eosinophils Relative: 3 %
Immature Granulocytes: 0 %
Lymphocytes Relative: 31 %
Lymphs Abs: 2 10*3/uL (ref 0.7–4.0)
Monocytes Absolute: 0.7 10*3/uL (ref 0.1–1.0)
Monocytes Relative: 10 %
Neutro Abs: 3.7 10*3/uL (ref 1.7–7.7)
Neutrophils Relative %: 55 %

## 2020-06-17 LAB — CBC
HCT: 50.3 % (ref 39.0–52.0)
Hemoglobin: 16.7 g/dL (ref 13.0–17.0)
MCH: 29.9 pg (ref 26.0–34.0)
MCHC: 33.2 g/dL (ref 30.0–36.0)
MCV: 90 fL (ref 80.0–100.0)
Platelets: 85 10*3/uL — ABNORMAL LOW (ref 150–400)
RBC: 5.59 MIL/uL (ref 4.22–5.81)
RDW: 12.9 % (ref 11.5–15.5)
WBC: 6.6 10*3/uL (ref 4.0–10.5)
nRBC: 0 % (ref 0.0–0.2)

## 2020-06-17 LAB — VITAMIN D 25 HYDROXY (VIT D DEFICIENCY, FRACTURES): Vit D, 25-Hydroxy: 14.5 ng/mL — ABNORMAL LOW (ref 30–100)

## 2020-06-17 LAB — I-STAT CHEM 8, ED
BUN: 13 mg/dL (ref 6–20)
Calcium, Ion: 1.18 mmol/L (ref 1.15–1.40)
Chloride: 100 mmol/L (ref 98–111)
Creatinine, Ser: 1 mg/dL (ref 0.61–1.24)
Glucose, Bld: 127 mg/dL — ABNORMAL HIGH (ref 70–99)
HCT: 47 % (ref 39.0–52.0)
Hemoglobin: 16 g/dL (ref 13.0–17.0)
Potassium: 3.7 mmol/L (ref 3.5–5.1)
Sodium: 140 mmol/L (ref 135–145)
TCO2: 28 mmol/L (ref 22–32)

## 2020-06-17 LAB — URINALYSIS, ROUTINE W REFLEX MICROSCOPIC
Bacteria, UA: NONE SEEN
Bilirubin Urine: NEGATIVE
Glucose, UA: >=500 mg/dL — AB
Ketones, ur: NEGATIVE mg/dL
Leukocytes,Ua: NEGATIVE
Nitrite: NEGATIVE
Protein, ur: 100 mg/dL — AB
Specific Gravity, Urine: >1.046 — ABNORMAL HIGH (ref 1.005–1.030)
pH: 6 (ref 5.0–8.0)

## 2020-06-17 LAB — COMPREHENSIVE METABOLIC PANEL
ALT: 49 U/L — ABNORMAL HIGH (ref 0–44)
AST: 22 U/L (ref 15–41)
Albumin: 3.8 g/dL (ref 3.5–5.0)
Alkaline Phosphatase: 91 U/L (ref 38–126)
Anion gap: 9 (ref 5–15)
BUN: 13 mg/dL (ref 6–20)
CO2: 27 mmol/L (ref 22–32)
Calcium: 8.9 mg/dL (ref 8.9–10.3)
Chloride: 101 mmol/L (ref 98–111)
Creatinine, Ser: 0.93 mg/dL (ref 0.61–1.24)
GFR, Estimated: >60 mL/min (ref >60)
Glucose, Bld: 126 mg/dL — ABNORMAL HIGH (ref 70–99)
Potassium: 3.5 mmol/L (ref 3.5–5.1)
Sodium: 137 mmol/L (ref 135–145)
Total Bilirubin: 0.7 mg/dL (ref 0.3–1.2)
Total Protein: 6.6 g/dL (ref 6.5–8.1)

## 2020-06-17 LAB — HEMOGLOBIN A1C
Hgb A1c MFr Bld: 8.3 % — ABNORMAL HIGH (ref 4.8–5.6)
Mean Plasma Glucose: 191.51 mg/dL

## 2020-06-17 LAB — LIPID PANEL
Cholesterol: 94 mg/dL (ref 0–200)
HDL: 34 mg/dL — ABNORMAL LOW (ref >40)
LDL Cholesterol: 37 mg/dL (ref 0–99)
Total CHOL/HDL Ratio: 2.8 RATIO
Triglycerides: 115 mg/dL (ref <150)
VLDL: 23 mg/dL (ref 0–40)

## 2020-06-17 LAB — CBG MONITORING, ED
Glucose-Capillary: 134 mg/dL — ABNORMAL HIGH (ref 70–99)
Glucose-Capillary: 132 mg/dL — ABNORMAL HIGH (ref 70–99)
Glucose-Capillary: 88 mg/dL (ref 70–99)

## 2020-06-17 LAB — RESP PANEL BY RT-PCR (FLU A&B, COVID) ARPGX2
Influenza A by PCR: NEGATIVE
Influenza B by PCR: NEGATIVE
SARS Coronavirus 2 by RT PCR: NEGATIVE

## 2020-06-17 LAB — ETHANOL: Alcohol, Ethyl (B): <10 mg/dL (ref <10)

## 2020-06-17 LAB — APTT: aPTT: 30 seconds (ref 24–36)

## 2020-06-17 LAB — TSH: TSH: 1.982 u[IU]/mL (ref 0.350–4.500)

## 2020-06-17 LAB — GLUCOSE, CAPILLARY: Glucose-Capillary: 148 mg/dL — ABNORMAL HIGH (ref 70–99)

## 2020-06-17 LAB — HIV ANTIBODY (ROUTINE TESTING W REFLEX): HIV Screen 4th Generation wRfx: NONREACTIVE

## 2020-06-17 MED ORDER — ACETAMINOPHEN 650 MG RE SUPP: 650.0000 mg | RECTAL | Status: DC | PRN

## 2020-06-17 MED ORDER — CLOPIDOGREL BISULFATE 75 MG PO TABS: 75.0000 mg | ORAL_TABLET | Freq: Every day | ORAL | Status: DC

## 2020-06-17 MED ORDER — ASPIRIN EC 81 MG PO TBEC
81.0000 mg | DELAYED_RELEASE_TABLET | Freq: Every day | ORAL | Status: DC
  Administered 2020-06-18 – 2020-06-21 (×4): 81 mg via ORAL
  Filled 2020-06-17 (×4): qty 1

## 2020-06-17 MED ORDER — ATORVASTATIN CALCIUM 40 MG PO TABS
40.0000 mg | ORAL_TABLET | Freq: Every day | ORAL | Status: DC
  Administered 2020-06-18 – 2020-06-20 (×3): 40 mg via ORAL
  Filled 2020-06-17 (×3): qty 1

## 2020-06-17 MED ORDER — SODIUM CHLORIDE 0.9 % IV SOLN: INTRAVENOUS | Status: DC

## 2020-06-17 MED ORDER — ASPIRIN 81 MG PO CHEW: 324.0000 mg | CHEWABLE_TABLET | Freq: Once | ORAL | Status: DC

## 2020-06-17 MED ORDER — LORAZEPAM 2 MG/ML IJ SOLN
1.0000 mg | Freq: Once | INTRAMUSCULAR | Status: AC
  Administered 2020-06-17: 1 mg via INTRAVENOUS
  Filled 2020-06-17: qty 1

## 2020-06-17 MED ORDER — INSULIN GLARGINE 100 UNIT/ML ~~LOC~~ SOLN
12.0000 [IU] | Freq: Every day | SUBCUTANEOUS | Status: DC
  Administered 2020-06-17 – 2020-06-20 (×4): 12 [IU] via SUBCUTANEOUS
  Filled 2020-06-17 (×5): qty 0.12

## 2020-06-17 MED ORDER — SENNOSIDES-DOCUSATE SODIUM 8.6-50 MG PO TABS: 1.0000 | ORAL_TABLET | Freq: Every evening | ORAL | Status: DC | PRN

## 2020-06-17 MED ORDER — ACETAMINOPHEN 160 MG/5ML PO SOLN: 650.0000 mg | ORAL | Status: DC | PRN

## 2020-06-17 MED ORDER — INSULIN ASPART 100 UNIT/ML IJ SOLN: 0.0000 [IU] | Freq: Three times a day (TID) | INTRAMUSCULAR | Status: DC

## 2020-06-17 MED ORDER — ASPIRIN EC 325 MG PO TBEC: 325.0000 mg | DELAYED_RELEASE_TABLET | Freq: Every day | ORAL | Status: DC

## 2020-06-17 MED ORDER — ENOXAPARIN SODIUM 40 MG/0.4ML IJ SOSY: 40.0000 mg | PREFILLED_SYRINGE | INTRAMUSCULAR | Status: DC

## 2020-06-17 MED ORDER — ACETAMINOPHEN 650 MG RE SUPP: 650.0000 mg | Freq: Four times a day (QID) | RECTAL | Status: DC | PRN

## 2020-06-17 MED ORDER — STROKE: EARLY STAGES OF RECOVERY BOOK
Freq: Once | Status: DC
  Filled 2020-06-17: qty 1

## 2020-06-17 MED ORDER — VITAMIN D 25 MCG (1000 UNIT) PO TABS
1000.0000 [IU] | ORAL_TABLET | Freq: Every day | ORAL | Status: DC
  Administered 2020-06-18 – 2020-06-21 (×4): 1000 [IU] via ORAL
  Filled 2020-06-17 (×4): qty 1

## 2020-06-17 MED ORDER — ACETAMINOPHEN 325 MG PO TABS: 650.0000 mg | ORAL_TABLET | ORAL | Status: DC | PRN

## 2020-06-17 MED ORDER — IOHEXOL 350 MG/ML SOLN
100.0000 mL | Freq: Once | INTRAVENOUS | Status: AC | PRN
  Administered 2020-06-17: 100 mL via INTRAVENOUS

## 2020-06-17 MED ORDER — ACETAMINOPHEN 325 MG PO TABS
650.0000 mg | ORAL_TABLET | Freq: Four times a day (QID) | ORAL | Status: DC | PRN
  Administered 2020-06-19: 650 mg via ORAL
  Filled 2020-06-17: qty 2

## 2020-06-17 NOTE — ED Notes (Signed)
Admitting doctors just left after seeing the pt

## 2020-06-17 NOTE — ED Notes (Signed)
Unsuccessful attempt to call report took number to call me back

## 2020-06-17 NOTE — ED Notes (Signed)
Neuro at  The bed side

## 2020-06-17 NOTE — ED Notes (Signed)
The pt has had xrays of his lt elbow abd a c-t head after the latest fall

## 2020-06-17 NOTE — ED Notes (Signed)
This EMT was monitoring the patient's cardiac monitoring and noticed that it looked concerning. This EMT walked to the patient's room and found him on the floor by the bedside covered in urine. The patient was trying to get up to use the restroom. This EMT pressed their personal distress button and called for back up on the radio. Assistance arrived and a team helped to get the patient back into the bed. This EMT then took the patient's pants and undergarments off and placed an external catheter on with a gravity bag.

## 2020-06-17 NOTE — ED Notes (Addendum)
Please give the daughter a call with any updates, number is in the chart. Wife wants updates as well but stated to just call the daughters number.

## 2020-06-17 NOTE — ED Notes (Signed)
The pt has been given a sandwich to eat    The abrasion to the lt elbow cleaned with soap and water and dressed with a clean sterile dressing

## 2020-06-17 NOTE — ED Notes (Signed)
The pts visual acuity rt 20.25 lt 20/25

## 2020-06-17 NOTE — ED Notes (Signed)
Report given to stephanie rn on 3w

## 2020-06-17 NOTE — H&P (Signed)
History and Physical  [facility]  [place] YNW:[id number] DOB: 1960/08/02 DOA: 06/17/2020  PCP: Graylin Shiver [place]  Patient coming from: Home  Level of care: Telemetry Medical  I have personally briefly reviewed patient's old medical records in [facility]  Chief Complaint: stroke  HPI: Frank Luna is a 60 y.o. male with medical history significant several prior CVA and TIA events, most recently left ED in April 2021 AMA after having a TIA.  He was seen by Dr. Hyacinth Meeker and he had been given a prescription for plavix at that time.  Unfortunately he never followed up and he is not taking plavix at this time.  He is taking aspirin daily. He reported that he smokes cigarettes and not willing to quit.  He smokes 2 packs per day.  He is reporting that he started having symptoms overnight of left sided weakness, not sure of exact timing of symptoms and he has been a poor historian.  He has mostly left leg weakness  And left arm weakness.   He was seen by teleneurologist and they have been concerned that he had an acute CVA and recommended aspirin 81 mg daily and full stroke work up.    ED Course: CTA head and neck no acute findings.  Teleneuro evaluation completed and patient was admitted to [facility] for MRi and inpatient neurology consultation.  Unfortunately after orders completed patient has changed his mind after his symptoms improved and he is demanding that his IV be removed and he is discharging home against medical advice.  He has done this in the past most recently 05/2019 when he had a TIA.  He was counseled by the medical staff and he is adamant that he discharge home. He understands the risks and he does have capacity at this time to make his own medical decisions.    Review of Systems: Review of Systems  Constitutional: Negative.   HENT: Negative.   Eyes: Negative.   Respiratory: Negative.   Cardiovascular: Negative.   Gastrointestinal: Negative.   Skin: Negative.    Neurological: Positive for dizziness, sensory change, focal weakness and weakness. Negative for speech change and loss of consciousness.  Endo/Heme/Allergies: Negative.   All other systems reviewed and are negative.    Past Medical History:  Diagnosis Date  . Hypertension   . Stroke [facility])     History reviewed. No pertinent surgical history.   reports that he has been smoking cigarettes. He has been smoking about 2.00 packs per day. He has never used smokeless tobacco. He reports that he does not drink alcohol and does not use drugs.  Allergies  Allergen Reactions  . Penicillins     Unknown reaction    History reviewed. No pertinent family history.  Prior to Admission medications   Medication Sig Start Date End Date Taking? Authorizing Provider  acetaminophen (TYLENOL) 500 MG tablet Take 1,000 mg by mouth every 6 (six) hours as needed for headache (pain).    [provider]  aspirin EC 325 MG EC tablet Take 1 tablet (325 mg total) by mouth daily. 02/07/16   Burns, Tinnie Gens, MD  atorvastatin (LIPITOR) 40 MG tablet Take 1 tablet (40 mg total) by mouth daily at 6 PM. 02/06/16   Burns, Tinnie Gens, MD  clopidogrel (PLAVIX) 75 MG tablet Take 1 tablet (75 mg total) by mouth daily. 06/08/19   Eber Hong, MD  dapagliflozin propanediol (FARXIGA) 10 MG TABS tablet Take 10 mg by mouth daily.  [provider]  glipiZIDE-metformin (METAGLIP) 5-500 MG tablet Take 2 tablets by mouth 2 (two) times daily before a meal.    [provider]  metoprolol succinate (TOPROL-XL) 50 MG 24 hr tablet Take 1 tablet (50 mg total) by mouth daily. Take with or immediately following a meal. Patient taking differently: Take 75 mg by mouth daily. Take with or immediately following a meal.  02/06/16   Servando Snare, MD    Physical Exam: Vitals:   06/17/20 0207 06/17/20 0219 06/17/20 0300 06/17/20 0500  BP:  (!) 199/84 (!) 197/92 (!) 175/95  Pulse:  65 (!) 58   Resp:  (!) 22 18 20    Temp:  98 F (36.7 C)    TempSrc:  Oral    SpO2:  99% 97% 95%  Weight: 86 kg     Height: 5\' 9"  (1.753 m)       Constitutional: NAD, calm, comfortable.  Speech clear. Oriented x 3.   Eyes: PERRL, lids and conjunctivae normal ENMT: Mucous membranes are moist. Posterior pharynx clear of any exudate or lesions.Normal dentition.  Neck: normal, supple, no masses, no thyromegaly Respiratory: clear to auscultation bilaterally, no wheezing, no crackles. Normal respiratory effort. No accessory muscle use.  Cardiovascular: normal s1, s2 sounds, no murmurs / rubs / gallops. No extremity edema. 2+ pedal pulses. No carotid bruits.  Abdomen: no tenderness, no masses palpated. No hepatosplenomegaly. Bowel sounds positive.  Musculoskeletal: no clubbing / cyanosis. No joint deformity upper and lower extremities. Good ROM, no contractures. Normal muscle tone.  Skin: no rashes, lesions, ulcers. No induration Neurologic: CN 2-12 grossly intact. Mild left hemiparesis noted 4/5 LUE/LLE.   Psychiatric: Poor judgment and insight. Alert and oriented x 3. Normal mood.   Labs on Admission: I have personally reviewed following labs and imaging studies  CBC: Recent Labs  Lab 06/17/20 0203 06/17/20 0229  WBC 6.6  --   NEUTROABS 3.7  --   HGB 16.7 16.0  HCT 50.3 47.0  MCV 90.0  --   PLT 85*  --    Basic Metabolic Panel: Recent Labs  Lab 06/17/20 0203 06/17/20 0229  NA 137 140  K 3.5 3.7  CL 101 100  CO2 27  --   GLUCOSE 126* 127*  BUN 13 13  CREATININE 0.93 1.00  CALCIUM 8.9  --    GFR: Estimated Creatinine Clearance: 86.4 mL/min (by C-G formula based on SCr of 1 mg/dL). Liver Function Tests: Recent Labs  Lab 06/17/20 0203  AST 22  ALT 49*  ALKPHOS 91  BILITOT 0.7  PROT 6.6  ALBUMIN 3.8   No results for input(s): LIPASE, AMYLASE in the last 168 hours. No results for input(s): AMMONIA in the last 168 hours. Coagulation Profile: Recent Labs  Lab 06/17/20 0203  INR 1.1   Cardiac  Enzymes: No results for input(s): CKTOTAL, CKMB, CKMBINDEX, TROPONINI in the last 168 hours. BNP (last 3 results) No results for input(s): PROBNP in the last 8760 hours. HbA1C: No results for input(s): HGBA1C in the last 72 hours. CBG: Recent Labs  Lab 06/17/20 0220  GLUCAP 134*   Lipid Profile: No results for input(s): CHOL, HDL, LDLCALC, TRIG, CHOLHDL, LDLDIRECT in the last 72 hours. Thyroid Function Tests: No results for input(s): TSH, T4TOTAL, FREET4, T3FREE, THYROIDAB in the last 72 hours. Anemia Panel: No results for input(s): VITAMINB12, FOLATE, FERRITIN, TIBC, IRON, RETICCTPCT in the last 72 hours. Urine analysis:    Component Value Date/Time   COLORURINE YELLOW 02/05/2016  1533   APPEARANCEUR CLEAR 02/05/2016 1533   LABSPEC 1.014 02/05/2016 1533   PHURINE 6.0 02/05/2016 1533   GLUCOSEU NEGATIVE 02/05/2016 1533   HGBUR NEGATIVE 02/05/2016 1533   BILIRUBINUR NEGATIVE 02/05/2016 1533   KETONESUR NEGATIVE 02/05/2016 1533   PROTEINUR NEGATIVE 02/05/2016 1533   NITRITE NEGATIVE 02/05/2016 1533   LEUKOCYTESUR NEGATIVE 02/05/2016 1533    Radiological Exams on Admission: CT HEAD WO CONTRAST  Result Date: 06/17/2020 CLINICAL DATA:  Left-sided arm and leg weakness. EXAM: CT HEAD WITHOUT CONTRAST TECHNIQUE: Contiguous axial images were obtained from the base of the skull through the vertex without intravenous contrast. COMPARISON:  February 04, 2016 FINDINGS: Brain: There is mild cerebral atrophy with widening of the extra-axial spaces and ventricular dilatation. There are areas of decreased attenuation within the white matter tracts of the supratentorial brain, consistent with microvascular disease changes. A chronic left basal ganglia lacunar infarct is seen. Vascular: No hyperdense vessel or unexpected calcification. Skull: Normal. Negative for fracture or focal lesion. Sinuses/Orbits: There is mild right maxillary sinus mucosal thickening. Other: None. IMPRESSION: 1. Generalized  cerebral atrophy. 2. No acute intracranial abnormality. Electronically Signed   By: Aram Candela M.D.   On: 06/17/2020 04:29   CT ANGIO HEAD NECK W WO CM W PERF  Result Date: 06/17/2020 CLINICAL DATA:  60 year old male with left side numbness and weakness upon waking. 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 EXAM: CT ANGIOGRAPHY HEAD AND NECK TECHNIQUE: Multidetector CT imaging of the head and neck was performed using the standard protocol during bolus administration of intravenous contrast. Multiplanar CT image reconstructions and MIPs were obtained to evaluate the vascular anatomy. Carotid stenosis measurements (when applicable) are obtained utilizing NASCET criteria, using the distal internal carotid diameter as the denominator. CONTRAST:  OMNIPAQUE IOHEXOL 350 MG/ML SOLN COMPARISON:  CTA head and neck and CT brain perfusion 06/08/2019. CTA head and neck 02/04/2016. FINDINGS: CTA NECK Skeleton: Carious dentition. Progressive left middle ear opacification since last year. Chronic mastoid effusions greater on the left. No acute osseous abnormality identified. Upper chest: Stable upper lungs, mild pulmonary scarring. No superior mediastinal lymphadenopathy. Other neck: Partially effaced pharynx similar to last year. Chronic adenoid soft tissue enlargement has not significantly changed. No discrete nasopharynx mass or hyperenhancement. Negative parapharyngeal and retropharyngeal spaces. No cervical lymphadenopathy identified. Aortic arch: Calcified aortic atherosclerosis. 3 vessel arch configuration. Right carotid system: Mild right CCA plaque without stenosis. Mild to moderate soft and calcified plaque at the right carotid bifurcation through ICA bulb without stenosis. Left carotid system: Minimal left CCA soft plaque without stenosis. Mild mostly calcified plaque at the left carotid bifurcation and ICA origin without stenosis. Vertebral  arteries: Proximal subclavian arteries are patent with mild plaque and no significant stenosis. Both vertebral arteries are diminutive, more so the right. There is chronic atherosclerosis at the right vertebral artery origin with at least moderate stenosis there, stable. Left vertebral artery origin is within normal limits. Both diminutive vertebral arteries remain patent to the skull base without additional stenosis. CTA HEAD Posterior circulation: Diminutive right vertebral artery terminates in PICA. Left PICA origin is patent. Diminutive left V4 segment continues to the vertebrobasilar junction. 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 This appearance unchanged since 2017 and likely congenital. Both posterior communicating arteries are present, the left is larger. There is  mild bilateral P1 irregularity and stenosis. PCA branches are within normal limits. Anterior circulation: Both ICA siphons are patent. On the left mild to moderate calcified atherosclerosis results in mild supraclinoid stenosis. On the right similar mild to moderate calcified plaque and mild supraclinoid stenosis. Right side persistent trigeminal artery also with mild calcified plaque. Normal ophthalmic and posterior communicating artery origins. Patent carotid termini. Patent MCA and ACA origins. Mild new left ACA A1 segment stenosis on series 14, image 22. Diminutive anterior communicating artery. Mildly fenestrated proximal left A2 (normal variant). Bilateral ACA branches are stable and within normal limits. Left MCA M1 segment and bifurcation are patent without stenosis. Right MCA M1 segment and bifurcation are patent without stenosis. Bilateral MCA branches are stable and within normal limits. Venous sinuses: Early contrast timing.  Grossly patent. Anatomic variants: Persistent right  side trigeminal artery supplies the distal basilar. Right vertebral artery terminates in PICA. Review of the MIP images confirms the above findings IMPRESSION: 1. Negative for emergent large vessel occlusion. 2. 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 3. Mild posterior circulation atherosclerosis otherwise, mild bilateral P1 stenoses. 4. Bilateral carotid atherosclerosis but no hemodynamically significant stenosis (mild supraclinoid ICA stenosis). 5. Progressed left middle ear opacification since last year with chronic left mastoid effusion. 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 No discrete nasopharyngeal mass. Recommend ENT follow-up for left eustachian tube dysfunction. 6.  Aortic Atherosclerosis (ICD10-I70.0). Electronically Signed   By: Odessa FlemingH  Hall M.D.   On: 06/17/2020 05:59    Assessment/Plan Principal Problem:   Acute CVA (cerebrovascular accident) [facility](HCC) Active Problems:   Speech abnormality   Smoker   Essential hypertension   Hyperlipidemia   Cognitive impairment   Pre-diabetes   History of CVA (cerebrovascular accident)   1. TIA versus acute CVA - [facility].  He has been restarted on aspirin (not sure how compliant he has been with taking it). He is no longer taking plavix.  He needs MRI brain and not available on this campus.  Unfortunately patient refuses further treatment and is leaving against medical advice.  I have assessed him and determined that he does have capacity to make medical decisions.  He was counseled on the risks of leaving and he verbalized understanding.  I could not convince him or family member with him to stay.  They requested that his IV be removed. He remains high risk for bad outcome, high risk for readmission.  He verbalized understanding.       DVT prophylaxis: enoxaparin  Code Status: full   Family Communication: updated male family member at bedside   Disposition Plan: leaving AMA   Consults called: neurology   Admission status: INP   Level of care: Telemetry Medical Standley Dakinslanford Zinedine Ellner MD [facility] How to contact the [facility] Attending or Consulting provider 7A - 7P or covering provider during after hours 7P -7A, for this patient?  1. Check the care team in [facility] and look for a) attending/consulting [facility] provider listed and b) the [facility] team listed 2. Log into [email or web address] and use [place]'s universal password to access. If you do not have the password, please contact the hospital operator. 3. Locate the [facility] provider you are looking for under [facility] and page to a number that you can be directly reached. 4. If you still have  difficulty reaching the provider, please page the [facility] (Director on Call) for the Hospitalists listed on amion for assistance.   If 7PM-7AM, please contact night-coverage [email or web address] Password [facility]  06/17/2020, 7:29 AM

## 2020-06-17 NOTE — ED Provider Notes (Signed)
[facility] EMERGENCY DEPARTMENT Provider Note   CSN: [id number] Arrival date & time: 06/17/20  0150     History No chief complaint on file.   Frank Luna is a 60 y.o. male.  Patient presents to the emergency department for evaluation of left-sided weakness.  Patient reports that he went to bed around 9 PM today.  He was already feeling some numbness on the left side of his face at that time, cannot estimate when that started.  He just woke up and noted that he was weak on his entire left side.  He is having trouble walking because of weakness of the left leg and he has noticed weakness of his left arm.        Past Medical History:  Diagnosis Date  . Hypertension   . Stroke [facility])     Patient Active Problem List   Diagnosis Date Noted  . Hyperlipidemia 02/06/2016  . Cognitive impairment 02/06/2016  . Morbid obesity (HCC) 02/06/2016  . Pre-diabetes 02/06/2016  . History of CVA (cerebrovascular accident) 02/06/2016  . Speech abnormality   . Smoker   . Essential hypertension   . Acute ischemic stroke (HCC) 02/04/2016    History reviewed. No pertinent surgical history.     History reviewed. No pertinent family history.  Social History   Tobacco Use  . Smoking status: Current Every Day Smoker    Packs/day: 2.00    Types: Cigarettes  . Smokeless tobacco: Never Used  Vaping Use  . Vaping Use: Never used  Substance Use Topics  . Alcohol use: No  . Drug use: No    Home Medications Prior to Admission medications   Medication Sig Start Date End Date Taking? Authorizing Provider  acetaminophen (TYLENOL) 500 MG tablet Take 1,000 mg by mouth every 6 (six) hours as needed for headache (pain).    [provider]  aspirin EC 325 MG EC tablet Take 1 tablet (325 mg total) by mouth daily. 02/07/16   Burns, Tinnie Gens, MD  atorvastatin (LIPITOR) 40 MG tablet Take 1 tablet (40 mg total) by mouth daily at 6 PM. 02/06/16   Burns, Tinnie Gens, MD  clopidogrel (PLAVIX) 75 MG  tablet Take 1 tablet (75 mg total) by mouth daily. 06/08/19   Eber Hong, MD  dapagliflozin propanediol (FARXIGA) 10 MG TABS tablet Take 10 mg by mouth daily.    [provider]  glipiZIDE-metformin (METAGLIP) 5-500 MG tablet Take 2 tablets by mouth 2 (two) times daily before a meal.    [provider]  metoprolol succinate (TOPROL-XL) 50 MG 24 hr tablet Take 1 tablet (50 mg total) by mouth daily. Take with or immediately following a meal. Patient taking differently: Take 75 mg by mouth daily. Take with or immediately following a meal.  02/06/16   Burns, Tinnie Gens, MD    Allergies    Penicillins  Review of Systems   Review of Systems  Neurological: Positive for facial asymmetry, weakness and numbness.  All other systems reviewed and are negative.   Physical Exam Updated Vital Signs BP (!) 175/95   Pulse (!) 58   Temp 98 F (36.7 C) (Oral)   Resp 20   Ht 5\' 9"  (1.753 m)   Wt 86 kg   SpO2 95%   BMI 28.00 kg/m   Physical Exam Vitals and nursing note reviewed.  Constitutional:      General: He is not in acute distress.    Appearance: Normal appearance. He is well-developed.  HENT:  Head: Normocephalic and atraumatic.     Right Ear: Hearing normal.     Left Ear: Hearing normal.     Nose: Nose normal.  Eyes:     Conjunctiva/sclera: Conjunctivae normal.     Pupils: Pupils are equal, round, and reactive to light.  Cardiovascular:     Rate and Rhythm: Regular rhythm.     Heart sounds: S1 normal and S2 normal. No murmur heard. No friction rub. No gallop.   Pulmonary:     Effort: Pulmonary effort is normal. No respiratory distress.     Breath sounds: Normal breath sounds.  Chest:     Chest wall: No tenderness.  Abdominal:     General: Bowel sounds are normal.     Palpations: Abdomen is soft.     Tenderness: There is no abdominal tenderness. There is no guarding or rebound. Negative signs include Murphy's sign and McBurney's sign.     Hernia: No hernia  is present.  Musculoskeletal:        General: Normal range of motion.     Cervical back: Normal range of motion and neck supple.  Skin:    General: Skin is warm and dry.     Findings: No rash.  Neurological:     Mental Status: He is alert and oriented to person, place, and time.     GCS: GCS eye subscore is 4. GCS verbal subscore is 5. GCS motor subscore is 6.     Cranial Nerves: Cranial nerve deficit and facial asymmetry present.     Sensory: No sensory deficit.     Motor: Weakness (LUE weak with pronator drift) present.     Coordination: Coordination normal.  Psychiatric:        Speech: Speech normal.        Behavior: Behavior normal.        Thought Content: Thought content normal.     ED Results / Procedures / Treatments   Labs (all labs ordered are listed, but only abnormal results are displayed) Labs Reviewed  CBC - Abnormal; Notable for the following components:      Result Value   Platelets 85 (*)    All other components within normal limits  COMPREHENSIVE METABOLIC PANEL - Abnormal; Notable for the following components:   Glucose, Bld 126 (*)    ALT 49 (*)    All other components within normal limits  I-STAT CHEM 8, ED - Abnormal; Notable for the following components:   Glucose, Bld 127 (*)    All other components within normal limits  CBG MONITORING, ED - Abnormal; Notable for the following components:   Glucose-Capillary 134 (*)    All other components within normal limits  RESP PANEL BY RT-PCR (FLU A&B, COVID) ARPGX2  ETHANOL  PROTIME-INR  APTT  DIFFERENTIAL  RAPID URINE DRUG SCREEN, HOSP PERFORMED  URINALYSIS, ROUTINE W REFLEX MICROSCOPIC    EKG None  Radiology CT HEAD WO CONTRAST  Result Date: 06/17/2020 CLINICAL DATA:  Left-sided arm and leg weakness. EXAM: CT HEAD WITHOUT CONTRAST TECHNIQUE: Contiguous axial images were obtained from the base of the skull through the vertex without intravenous contrast. COMPARISON:  February 04, 2016 FINDINGS:  Brain: There is mild cerebral atrophy with widening of the extra-axial spaces and ventricular dilatation. There are areas of decreased attenuation within the white matter tracts of the supratentorial brain, consistent with microvascular disease changes. A chronic left basal ganglia lacunar infarct is seen. Vascular: No hyperdense vessel or unexpected calcification. Skull: Normal. Negative  for fracture or focal lesion. Sinuses/Orbits: There is mild right maxillary sinus mucosal thickening. Other: None. IMPRESSION: 1. Generalized cerebral atrophy. 2. No acute intracranial abnormality. Electronically Signed   By: Aram Candelahaddeus  Houston M.D.   On: 06/17/2020 04:29   CT ANGIO HEAD NECK W WO CM W PERF  Result Date: 06/17/2020 CLINICAL DATA:  60 year old male with left side numbness and weakness upon waking. 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 EXAM: CT ANGIOGRAPHY HEAD AND NECK TECHNIQUE: Multidetector CT imaging of the head and neck was performed using the standard protocol during bolus administration of intravenous contrast. Multiplanar CT image reconstructions and MIPs were obtained to evaluate the vascular anatomy. Carotid stenosis measurements (when applicable) are obtained utilizing NASCET criteria, using the distal internal carotid diameter as the denominator. CONTRAST:  100mL OMNIPAQUE IOHEXOL 350 MG/ML SOLN COMPARISON:  CTA head and neck and CT brain perfusion 06/08/2019. CTA head and neck 02/04/2016. FINDINGS: CTA NECK Skeleton: Carious dentition. Progressive left middle ear opacification since last year. Chronic mastoid effusions greater on the left. No acute osseous abnormality identified. Upper chest: Stable upper lungs, mild pulmonary scarring. No superior mediastinal lymphadenopathy. Other neck: Partially effaced pharynx similar to last year. Chronic adenoid soft tissue enlargement has not significantly changed. No discrete nasopharynx mass or  hyperenhancement. Negative parapharyngeal and retropharyngeal spaces. No cervical lymphadenopathy identified. Aortic arch: Calcified aortic atherosclerosis. 3 vessel arch configuration. Right carotid system: Mild right CCA plaque without stenosis. Mild to moderate soft and calcified plaque at the right carotid bifurcation through ICA bulb without stenosis. Left carotid system: Minimal left CCA soft plaque without stenosis. Mild mostly calcified plaque at the left carotid bifurcation and ICA origin without stenosis. Vertebral arteries: Proximal subclavian arteries are patent with mild plaque and no significant stenosis. Both vertebral arteries are diminutive, more so the right. There is chronic atherosclerosis at the right vertebral artery origin with at least moderate stenosis there, stable. Left vertebral artery origin is within normal limits. Both diminutive vertebral arteries remain patent to the skull base without additional stenosis. CTA HEAD Posterior circulation: Diminutive right vertebral artery terminates in PICA. Left PICA origin is patent. Diminutive left V4 segment continues to the vertebrobasilar junction. 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 This appearance unchanged since 2017 and likely congenital. Both posterior communicating arteries are present, the left is larger. There is mild bilateral P1 irregularity and stenosis. PCA branches are within normal limits. Anterior circulation: Both ICA siphons are patent. On the left mild to moderate calcified atherosclerosis results in mild supraclinoid stenosis. On the right similar mild to moderate calcified plaque and mild supraclinoid stenosis. Right side persistent trigeminal artery also with mild calcified plaque. Normal ophthalmic and posterior communicating artery origins. Patent carotid termini.  Patent MCA and ACA origins. Mild new left ACA A1 segment stenosis on series 14, image 22. Diminutive anterior communicating artery. Mildly fenestrated proximal left A2 (normal variant). Bilateral ACA branches are stable and within normal limits. Left MCA M1 segment and bifurcation are patent without stenosis. Right MCA M1 segment and bifurcation are patent without stenosis. Bilateral MCA branches are stable and within normal limits. Venous sinuses: Early contrast timing.  Grossly patent. 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 Right vertebral artery terminates in PICA. Review of the MIP images confirms the above findings IMPRESSION: 1.  Negative for emergent large vessel occlusion. 2. 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 3. Mild posterior circulation atherosclerosis otherwise, mild bilateral P1 stenoses. 4. Bilateral carotid atherosclerosis but no hemodynamically significant stenosis (mild supraclinoid ICA stenosis). 5. Progressed left middle ear opacification since last year with chronic left mastoid effusion. 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 No discrete nasopharyngeal mass. Recommend ENT follow-up for left eustachian tube dysfunction. 6.  Aortic Atherosclerosis (ICD10-I70.0). Electronically Signed   By: Odessa Fleming M.D.   On: 06/17/2020 05:59    Procedures Procedures   Medications Ordered in ED Medications  iohexol (OMNIPAQUE) 350 MG/ML injection 100 mL (100 mLs Intravenous Contrast Given 06/17/20 0517)    ED Course  I have reviewed the triage vital signs and the nursing notes.  Pertinent labs & imaging results that were available during my care of the patient were reviewed by me and considered in my medical decision making (see chart for details).    MDM  Rules/Calculators/A&P                          Patient presents to the emergency department for evaluation of left-sided weakness.  Last known normal is not clear.  Patient arrived in the ED at 1:50 AM.  He reports that he went to sleep around 9 PM and was already having numbness of the left side of his face.  He cannot tell me how many hours that was present for before 9 PM.  When he woke up this morning he had progressed to worse numbness and weakness of the left side of his face but also left arm and left leg numbness and weakness.  Examination with left hemiparesis concerning for stroke.  Screening CT did not show acute abnormality.  Teleneurology evaluation recommended CT angiography head and neck with perfusion study.  No evidence of large vessel occlusion noted.  Neurology recommendation is admission for further stroke work-up.  CRITICAL CARE Performed by: Gilda Crease   Total critical care time: 35 minutes  Critical care time was exclusive of separately billable procedures and treating other patients.  Critical care was necessary to treat or prevent imminent or life-threatening deterioration.  Critical care was time spent personally by me on the following activities: development of treatment plan with patient and/or surrogate as well as nursing, discussions with consultants, evaluation of patient's response to treatment, examination of patient, obtaining history from patient or surrogate, ordering and performing treatments and interventions, ordering and review of laboratory studies, ordering and review of radiographic studies, pulse oximetry and re-evaluation of patient's condition.   Final Clinical Impression(s) / ED Diagnoses Final diagnoses:  Cerebrovascular accident (CVA), unspecified mechanism [facility])    Rx / DC Orders ED Discharge Orders    None       Ry Moody, Canary Brim, MD 06/17/20 [phone]

## 2020-06-17 NOTE — ED Provider Notes (Signed)
[facility] EMERGENCY DEPARTMENT Provider Note   CSN: [id number] Arrival date & time: 06/17/20  1242     History Chief Complaint  Patient presents with  . Cerebrovascular Accident    Harlis Champoux is a 60 y.o. male.  Patient with hx cva, htn, presents indicating since last evening he's had left sided weakness. Symptoms acute onset, constant, moderate, persistent.  States last felt normal/at baseline sometime before 9 pm last night. Pt went to [facility] last night, had labs and ct early this AM there - was told concern for stroke, but patient left AMA. Patient notes symptoms have remained constant without new symptoms or acute worsening. Denies headache. No neck or back pain. No fever or chills. Is ambulatory but with difficulty.   The history is provided by the patient.  Cerebrovascular Accident Pertinent negatives include no chest pain, no abdominal pain, no headaches and no shortness of breath.       Past Medical History:  Diagnosis Date  . Hypertension   . Stroke [facility])     Patient Active Problem List   Diagnosis Date Noted  . Acute CVA (cerebrovascular accident) (HCC) 06/17/2020  . Hyperlipidemia 02/06/2016  . Cognitive impairment 02/06/2016  . Morbid obesity (HCC) 02/06/2016  . Pre-diabetes 02/06/2016  . History of CVA (cerebrovascular accident) 02/06/2016  . Speech abnormality   . Smoker   . Essential hypertension   . Acute ischemic stroke (HCC) 02/04/2016    History reviewed. No pertinent surgical history.     No family history on file.  Social History   Tobacco Use  . Smoking status: Current Every Day Smoker    Packs/day: 2.00    Types: Cigarettes  . Smokeless tobacco: Never Used  Vaping Use  . Vaping Use: Never used  Substance Use Topics  . Alcohol use: No  . Drug use: No    Home Medications Prior to Admission medications   Medication Sig Start Date End Date Taking? Authorizing Provider  acetaminophen (TYLENOL) 500 MG tablet  Take 1,000 mg by mouth every 6 (six) hours as needed for headache (pain).    [provider]  aspirin EC 325 MG EC tablet Take 1 tablet (325 mg total) by mouth daily. 02/07/16   Burns, Tinnie Gens, MD  atorvastatin (LIPITOR) 40 MG tablet Take 1 tablet (40 mg total) by mouth daily at 6 PM. 02/06/16   Burns, Tinnie Gens, MD  clopidogrel (PLAVIX) 75 MG tablet Take 1 tablet (75 mg total) by mouth daily. 06/08/19   Eber Hong, MD  dapagliflozin propanediol (FARXIGA) 10 MG TABS tablet Take 10 mg by mouth daily.    [provider]  glipiZIDE-metformin (METAGLIP) 5-500 MG tablet Take 2 tablets by mouth 2 (two) times daily before a meal.    [provider]  metoprolol succinate (TOPROL-XL) 50 MG 24 hr tablet Take 1 tablet (50 mg total) by mouth daily. Take with or immediately following a meal. Patient taking differently: Take 75 mg by mouth daily. Take with or immediately following a meal.  02/06/16   Burns, Tinnie Gens, MD    Allergies    Penicillins  Review of Systems   Review of Systems  Constitutional: Negative for chills and fever.  HENT: Negative for trouble swallowing.   Eyes: Negative for visual disturbance.  Respiratory: Negative for shortness of breath.   Cardiovascular: Negative for chest pain.  Gastrointestinal: Negative for abdominal pain, nausea and vomiting.  Genitourinary: Negative for flank pain.  Musculoskeletal: Negative for back  pain and neck pain.  Skin: Negative for rash.  Neurological: Positive for weakness. Negative for headaches.  Hematological: Does not bruise/bleed easily.  Psychiatric/Behavioral: Negative for confusion.    Physical Exam Updated Vital Signs BP (!) 182/86 (BP Location: Right Arm)   Pulse 61   Temp 98.2 F (36.8 C) (Oral)   Resp 18   SpO2 98%   Physical Exam Vitals and nursing note reviewed.  Constitutional:      Appearance: Normal appearance. He is well-developed.  HENT:     Head: Atraumatic.     Nose: Nose normal.      Mouth/Throat:     Mouth: Mucous membranes are moist.     Pharynx: Oropharynx is clear.  Eyes:     General: No scleral icterus.    Conjunctiva/sclera: Conjunctivae normal.     Pupils: Pupils are equal, round, and reactive to light.  Neck:     Vascular: No carotid bruit.     Trachea: No tracheal deviation.  Cardiovascular:     Rate and Rhythm: Normal rate and regular rhythm.     Pulses: Normal pulses.     Heart sounds: Normal heart sounds. No murmur heard. No friction rub. No gallop.   Pulmonary:     Effort: Pulmonary effort is normal. No accessory muscle usage or respiratory distress.     Breath sounds: Normal breath sounds.  Abdominal:     General: Bowel sounds are normal. There is no distension.     Palpations: Abdomen is soft.     Tenderness: There is no abdominal tenderness.  Genitourinary:    Comments: No cva tenderness. Musculoskeletal:        General: No swelling.     Cervical back: Normal range of motion and neck supple. No rigidity.  Skin:    General: Skin is warm and dry.     Findings: No rash.  Neurological:     Mental Status: He is alert.     Comments: Alert, speech clear, no gross dysarthria or aphasia. Left sided weakness 4/5, LUE > LLE.   Psychiatric:        Mood and Affect: Mood normal.     ED Results / Procedures / Treatments   Labs (all labs ordered are listed, but only abnormal results are displayed) Results for orders placed or performed during the hospital encounter of 06/17/20  Resp Panel by RT-PCR (Flu A&B, Covid) Nasopharyngeal Swab   Specimen: Nasopharyngeal Swab; Nasopharyngeal(NP) swabs in vial transport medium  Result Value Ref Range   SARS Coronavirus 2 by RT PCR NEGATIVE NEGATIVE   Influenza A by PCR NEGATIVE NEGATIVE   Influenza B by PCR NEGATIVE NEGATIVE  Ethanol  Result Value Ref Range   Alcohol, Ethyl (B) <10 <10 mg/dL  Protime-INR  Result Value Ref Range   Prothrombin Time 13.7 11.4 - 15.2 seconds   INR 1.1 0.8 - 1.2  APTT   Result Value Ref Range   aPTT 30 24 - 36 seconds  CBC  Result Value Ref Range   WBC 6.6 4.0 - 10.5 K/uL   RBC 5.59 4.22 - 5.81 MIL/uL   Hemoglobin 16.7 13.0 - 17.0 g/dL   HCT 40.9 81.1 - 91.4 %   MCV 90.0 80.0 - 100.0 fL   MCH 29.9 26.0 - 34.0 pg   MCHC 33.2 30.0 - 36.0 g/dL   RDW 78.2 95.6 - 21.3 %   Platelets 85 (L) 150 - 400 K/uL   nRBC 0.0 0.0 - 0.2 %  Differential  Result Value Ref Range   Neutrophils Relative % 55 %   Neutro Abs 3.7 1.7 - 7.7 K/uL   Lymphocytes Relative 31 %   Lymphs Abs 2.0 0.7 - 4.0 K/uL   Monocytes Relative 10 %   Monocytes Absolute 0.7 0.1 - 1.0 K/uL   Eosinophils Relative 3 %   Eosinophils Absolute 0.2 0.0 - 0.5 K/uL   Basophils Relative 1 %   Basophils Absolute 0.1 0.0 - 0.1 K/uL   Immature Granulocytes 0 %   Abs Immature Granulocytes 0.01 0.00 - 0.07 K/uL  Comprehensive metabolic panel  Result Value Ref Range   Sodium 137 135 - 145 mmol/L   Potassium 3.5 3.5 - 5.1 mmol/L   Chloride 101 98 - 111 mmol/L   CO2 27 22 - 32 mmol/L   Glucose, Bld 126 (H) 70 - 99 mg/dL   BUN 13 6 - 20 mg/dL   Creatinine, Ser 1.61 0.61 - 1.24 mg/dL   Calcium 8.9 8.9 - 09.6 mg/dL   Total Protein 6.6 6.5 - 8.1 g/dL   Albumin 3.8 3.5 - 5.0 g/dL   AST 22 15 - 41 U/L   ALT 49 (H) 0 - 44 U/L   Alkaline Phosphatase 91 38 - 126 U/L   Total Bilirubin 0.7 0.3 - 1.2 mg/dL   GFR, Estimated >04 >54 mL/min   Anion gap 9 5 - 15  Urine rapid drug screen (hosp performed)  Result Value Ref Range   Opiates NONE DETECTED NONE DETECTED   Cocaine NONE DETECTED NONE DETECTED   Benzodiazepines NONE DETECTED NONE DETECTED   Amphetamines NONE DETECTED NONE DETECTED   Tetrahydrocannabinol NONE DETECTED NONE DETECTED   Barbiturates NONE DETECTED NONE DETECTED  Urinalysis, Routine w reflex microscopic Urine, Clean Catch  Result Value Ref Range   Color, Urine STRAW (A) YELLOW   APPearance CLEAR CLEAR   Specific Gravity, Urine >1.046 (H) 1.005 - 1.030   pH 6.0 5.0 - 8.0   Glucose,  UA >=500 (A) NEGATIVE mg/dL   Hgb urine dipstick SMALL (A) NEGATIVE   Bilirubin Urine NEGATIVE NEGATIVE   Ketones, ur NEGATIVE NEGATIVE mg/dL   Protein, ur 098 (A) NEGATIVE mg/dL   Nitrite NEGATIVE NEGATIVE   Leukocytes,Ua NEGATIVE NEGATIVE   RBC / HPF 0-5 0 - 5 RBC/hpf   WBC, UA 0-5 0 - 5 WBC/hpf   Bacteria, UA NONE SEEN NONE SEEN  HIV Antibody (routine testing w rflx)  Result Value Ref Range   HIV Screen 4th Generation wRfx Non Reactive Non Reactive  Hemoglobin A1c  Result Value Ref Range   Hgb A1c MFr Bld 8.3 (H) 4.8 - 5.6 %   Mean Plasma Glucose 191.51 mg/dL  Lipid panel  Result Value Ref Range   Cholesterol 94 0 - 200 mg/dL   Triglycerides 119 <147 mg/dL   HDL 34 (L) >82 mg/dL   Total CHOL/HDL Ratio 2.8 RATIO   VLDL 23 0 - 40 mg/dL   LDL Cholesterol 37 0 - 99 mg/dL  TSH  Result Value Ref Range   TSH 1.982 0.350 - 4.500 uIU/mL  VITAMIN D 25 Hydroxy (Vit-D Deficiency, Fractures)  Result Value Ref Range   Vit D, 25-Hydroxy 14.50 (L) 30 - 100 ng/mL  I-stat chem 8, ED  Result Value Ref Range   Sodium 140 135 - 145 mmol/L   Potassium 3.7 3.5 - 5.1 mmol/L   Chloride 100 98 - 111 mmol/L   BUN 13 6 - 20 mg/dL   Creatinine, Ser 9.56  0.61 - 1.24 mg/dL   Glucose, Bld 366 (H) 70 - 99 mg/dL   Calcium, Ion 4.40 3.47 - 1.40 mmol/L   TCO2 28 22 - 32 mmol/L   Hemoglobin 16.0 13.0 - 17.0 g/dL   HCT 42.5 95.6 - 38.7 %  CBG monitoring, ED  Result Value Ref Range   Glucose-Capillary 134 (H) 70 - 99 mg/dL  CBG monitoring, ED  Result Value Ref Range   Glucose-Capillary 132 (H) 70 - 99 mg/dL   CT HEAD WO CONTRAST  Result Date: 06/17/2020 CLINICAL DATA:  Left-sided arm and leg weakness. EXAM: CT HEAD WITHOUT CONTRAST TECHNIQUE: Contiguous axial images were obtained from the base of the skull through the vertex without intravenous contrast. COMPARISON:  February 04, 2016 FINDINGS: Brain: There is mild cerebral atrophy with widening of the extra-axial spaces and ventricular dilatation.  There are areas of decreased attenuation within the white matter tracts of the supratentorial brain, consistent with microvascular disease changes. A chronic left basal ganglia lacunar infarct is seen. Vascular: No hyperdense vessel or unexpected calcification. Skull: Normal. Negative for fracture or focal lesion. Sinuses/Orbits: There is mild right maxillary sinus mucosal thickening. Other: None. IMPRESSION: 1. Generalized cerebral atrophy. 2. No acute intracranial abnormality. Electronically Signed   By: Aram Candela M.D.   On: 06/17/2020 04:29   CT ANGIO HEAD NECK W WO CM W PERF  Result Date: 06/17/2020 CLINICAL DATA:  60 year old male with left side numbness and weakness upon waking. 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 EXAM: CT ANGIOGRAPHY HEAD AND NECK TECHNIQUE: Multidetector CT imaging of the head and neck was performed using the standard protocol during bolus administration of intravenous contrast. Multiplanar CT image reconstructions and MIPs were obtained to evaluate the vascular anatomy. Carotid stenosis measurements (when applicable) are obtained utilizing NASCET criteria, using the distal internal carotid diameter as the denominator. CONTRAST:  OMNIPAQUE IOHEXOL 350 MG/ML SOLN COMPARISON:  CTA head and neck and CT brain perfusion 06/08/2019. CTA head and neck 02/04/2016. FINDINGS: CTA NECK Skeleton: Carious dentition. Progressive left middle ear opacification since last year. Chronic mastoid effusions greater on the left. No acute osseous abnormality identified. Upper chest: Stable upper lungs, mild pulmonary scarring. No superior mediastinal lymphadenopathy. Other neck: Partially effaced pharynx similar to last year. Chronic adenoid soft tissue enlargement has not significantly changed. No discrete nasopharynx mass or hyperenhancement. Negative parapharyngeal and retropharyngeal spaces. No cervical lymphadenopathy  identified. Aortic arch: Calcified aortic atherosclerosis. 3 vessel arch configuration. Right carotid system: Mild right CCA plaque without stenosis. Mild to moderate soft and calcified plaque at the right carotid bifurcation through ICA bulb without stenosis. Left carotid system: Minimal left CCA soft plaque without stenosis. Mild mostly calcified plaque at the left carotid bifurcation and ICA origin without stenosis. Vertebral arteries: Proximal subclavian arteries are patent with mild plaque and no significant stenosis. Both vertebral arteries are diminutive, more so the right. There is chronic atherosclerosis at the right vertebral artery origin with at least moderate stenosis there, stable. Left vertebral artery origin is within normal limits. Both diminutive vertebral arteries remain patent to the skull base without additional stenosis. CTA HEAD Posterior circulation: Diminutive right vertebral artery terminates in PICA. Left PICA origin is patent. Diminutive left V4 segment continues to the vertebrobasilar junction. 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 This appearance unchanged  since 2017 and likely congenital. Both posterior communicating arteries are present, the left is larger. There is mild bilateral P1 irregularity and stenosis. PCA branches are within normal limits. Anterior circulation: Both ICA siphons are patent. On the left mild to moderate calcified atherosclerosis results in mild supraclinoid stenosis. On the right similar mild to moderate calcified plaque and mild supraclinoid stenosis. Right side persistent trigeminal artery also with mild calcified plaque. Normal ophthalmic and posterior communicating artery origins. Patent carotid termini. Patent MCA and ACA origins. Mild new left ACA A1 segment stenosis on series 14, image 22.  Diminutive anterior communicating artery. Mildly fenestrated proximal left A2 (normal variant). Bilateral ACA branches are stable and within normal limits. Left MCA M1 segment and bifurcation are patent without stenosis. Right MCA M1 segment and bifurcation are patent without stenosis. Bilateral MCA branches are stable and within normal limits. Venous sinuses: Early contrast timing.  Grossly patent. 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 Right vertebral artery terminates in PICA. Review of the MIP images confirms the above findings IMPRESSION: 1. Negative for emergent large vessel occlusion. 2. 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 3. Mild posterior circulation atherosclerosis otherwise, mild bilateral P1 stenoses. 4. Bilateral carotid atherosclerosis but no hemodynamically significant stenosis (mild supraclinoid ICA stenosis). 5. Progressed left middle ear opacification since last year with chronic left mastoid effusion. 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 No discrete nasopharyngeal mass. Recommend ENT follow-up for left eustachian tube dysfunction. 6.  Aortic Atherosclerosis (ICD10-I70.0). Electronically Signed   By: Odessa Fleming M.D.   On: 06/17/2020 05:59    EKG None  Radiology CT HEAD WO CONTRAST  Result Date: 06/17/2020 CLINICAL DATA:  Left-sided arm and leg weakness. EXAM: CT HEAD WITHOUT CONTRAST TECHNIQUE: Contiguous axial images were obtained from the base of the skull through the vertex without intravenous contrast. COMPARISON:  February 04, 2016 FINDINGS: Brain: There is mild cerebral atrophy with widening of the extra-axial spaces and ventricular dilatation. There are areas of decreased attenuation within the white matter tracts of the supratentorial brain, consistent  with microvascular disease changes. A chronic left basal ganglia lacunar infarct is seen. Vascular: No hyperdense vessel or unexpected calcification. Skull: Normal. Negative for fracture or focal lesion. Sinuses/Orbits: There is mild right maxillary sinus mucosal thickening. Other: None. IMPRESSION: 1. Generalized cerebral atrophy. 2. No acute intracranial abnormality. Electronically Signed   By: Aram Candela M.D.   On: 06/17/2020 04:29   MR BRAIN WO CONTRAST  Result Date: 06/17/2020 CLINICAL DATA:  Left-sided arm and leg weakness. EXAM: MRI HEAD WITHOUT CONTRAST TECHNIQUE: Multiplanar, multiecho pulse sequences of the brain and surrounding structures were obtained without intravenous contrast. COMPARISON:  Negative acute CT evaluations for 202021 and earlier today. FINDINGS: Brain: Study suffers from considerable motion degradation. There is a 1 cm acute infarction within the white matter of the corona radiography on the right. No other acute infarction. Extensive chronic small-vessel ischemic changes affect the pons. No focal cerebellar infarction. Old small vessel infarctions are present affecting the thalami, basal ganglia and hemispheric white matter. No large vessel territory infarction. No mass, hemorrhage, hydrocephalus or extra-axial collection. Vascular: Major vessels at the base of the brain show flow. Skull and upper cervical spine: Negative Sinuses/Orbits: Clear/normal Other: Bilateral mastoid effusions. IMPRESSION: 1 cm acute infarction in the corona radiata white matter on the right. Extensive  chronic small-vessel ischemic changes elsewhere throughout brain as outlined above. Bilateral mastoid effusions. Electronically Signed   By: Paulina Fusi M.D.   On: 06/17/2020 16:12   CT ANGIO HEAD NECK W WO CM W PERF  Result Date: 06/17/2020 CLINICAL DATA:  60 year old male with left side numbness and weakness upon waking. History of persistent trigeminal artery, short segment chronic basilar  artery occlusion or atresia. EXAM: CT ANGIOGRAPHY HEAD AND NECK TECHNIQUE: Multidetector CT imaging of the head and neck was performed using the standard protocol during bolus administration of intravenous contrast. Multiplanar CT image reconstructions and MIPs were obtained to evaluate the vascular anatomy. Carotid stenosis measurements (when applicable) are obtained utilizing NASCET criteria, using the distal internal carotid diameter as the denominator. CONTRAST:  OMNIPAQUE IOHEXOL 350 MG/ML SOLN COMPARISON:  CTA head and neck and CT brain perfusion 06/08/2019. CTA head and neck 02/04/2016. FINDINGS: CTA NECK Skeleton: Carious dentition. Progressive left middle ear opacification since last year. Chronic mastoid effusions greater on the left. No acute osseous abnormality identified. Upper chest: Stable upper lungs, mild pulmonary scarring. No superior mediastinal lymphadenopathy. Other neck: Partially effaced pharynx similar to last year. Chronic adenoid soft tissue enlargement has not significantly changed. No discrete nasopharynx mass or hyperenhancement. Negative parapharyngeal and retropharyngeal spaces. No cervical lymphadenopathy identified. Aortic arch: Calcified aortic atherosclerosis. 3 vessel arch configuration. Right carotid system: Mild right CCA plaque without stenosis. Mild to moderate soft and calcified plaque at the right carotid bifurcation through ICA bulb without stenosis. Left carotid system: Minimal left CCA soft plaque without stenosis. Mild mostly calcified plaque at the left carotid bifurcation and ICA origin without stenosis. Vertebral arteries: Proximal subclavian arteries are patent with mild plaque and no significant stenosis. Both vertebral arteries are diminutive, more so the right. There is chronic atherosclerosis at the right vertebral artery origin with at least moderate stenosis there, stable. Left vertebral artery origin is within normal limits. Both diminutive vertebral  arteries remain patent to the skull base without additional stenosis. CTA HEAD Posterior circulation: Diminutive right vertebral artery terminates in PICA. Left PICA origin is patent. Diminutive left V4 segment continues to the vertebrobasilar junction. 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 This appearance unchanged since 2017 and likely congenital. Both posterior communicating arteries are present, the left is larger. There is mild bilateral P1 irregularity and stenosis. PCA branches are within normal limits. Anterior circulation: Both ICA siphons are patent. On the left mild to moderate calcified atherosclerosis results in mild supraclinoid stenosis. On the right similar mild to moderate calcified plaque and mild supraclinoid stenosis. Right side persistent trigeminal artery also with mild calcified plaque. Normal ophthalmic and posterior communicating artery origins. Patent carotid termini. Patent MCA and ACA origins. Mild new left ACA A1 segment stenosis on series 14, image 22. Diminutive anterior communicating artery. Mildly fenestrated proximal left A2 (normal variant). Bilateral ACA branches are stable and within normal limits. Left MCA M1 segment and bifurcation are patent without stenosis. Right MCA M1 segment and bifurcation are patent without stenosis. Bilateral MCA branches are stable and within normal limits. Venous sinuses: Early contrast timing.  Grossly patent. 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 Right vertebral artery terminates in PICA. Review of the MIP images confirms the above findings IMPRESSION: 1. Negative for emergent large vessel occlusion. 2. Chronic moderate stenosis at the origin  of the non dominant right vertebral artery which terminates in PICA. 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 3. Mild posterior circulation atherosclerosis otherwise, mild bilateral P1 stenoses. 4. Bilateral carotid atherosclerosis but no hemodynamically significant stenosis (mild supraclinoid ICA stenosis). 5. Progressed left middle ear opacification since last year with chronic left mastoid effusion. 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 No discrete nasopharyngeal mass. Recommend ENT follow-up for left eustachian tube dysfunction. 6.  Aortic Atherosclerosis (ICD10-I70.0). Electronically Signed   By: Odessa FlemingH  Hall M.D.   On: 06/17/2020 05:59    Procedures Procedures   Medications Ordered in ED Medications - No data to display  ED Course  I have reviewed the triage vital signs and the nursing notes.  Pertinent labs & imaging results that were available during my care of the patient were reviewed by me and considered in my medical decision making (see chart for details).    MDM Rules/Calculators/A&P                         Iv ns. Continuous pulse ox and cardiac monitoring.   Reviewed nursing notes and prior charts for additional history.   Labs from early this AM reviewed/interpreted by me - chem normal.   CTs from early this AM reviewed/interpreted by me - no hem.  Will get MRI.   MRI c/w cva. Pt outside window for tpa.   Neurology consulted. Discussed pt - they recommend admit to medicine.   Medicine consulted for admission.    Final Clinical Impression(s) / ED Diagnoses Final diagnoses:  None    Rx / DC Orders ED Discharge Orders    None       Cathren LaineSteinl, Kanyon Seibold, MD 06/17/20 1657

## 2020-06-17 NOTE — ED Notes (Signed)
Tele-neuro cart at bedside, neuro exam completed with nurse.

## 2020-06-17 NOTE — ED Notes (Signed)
Pt returned from ct

## 2020-06-17 NOTE — ED Notes (Signed)
Admitting doctors at  The bedside 

## 2020-06-17 NOTE — ED Notes (Signed)
Pt called out to use the restroom. This nurse went in the room and asked patient to use a urinal d/t pts left leg weakness. Pt refused and walked to the bathroom. This nurse assisted pt to restroom and stayed with him in the bathroom to help with stability. Pt did provide urine sample. Pt escorted back to the room. Pt set at the end of the stretcher on the edge. This nurse asked the patient to please come up higher in the bed and get back in the bed so patient could be placed on cardiac monitor. Pt states he did not want too. This nurse once again encouraged patient to move up in bed and get back in bed so orders could be performed ,NIH could be done and meds could be given. PT stood up in this nurses face and yelled " Im not getting on that damn monitor, I'm leaving". Pt drew his hand back as if he wanted to hit this nurse. This nurse explained to him to back up out of the way and let me out of the room as he was blocking me from the door. PTs wife fearful and in tears in the room asking him to please calm down. Security was called to the room and the patient set down in a chair in the room. Pt still in the chair refusing to get back in the bed. MD made aware.

## 2020-06-17 NOTE — ED Notes (Signed)
Report to Chris, RN.

## 2020-06-17 NOTE — ED Notes (Signed)
Pt

## 2020-06-17 NOTE — ED Notes (Signed)
The secretary just now paged the admitting doctors  She has been tied up with traumas

## 2020-06-17 NOTE — ED Notes (Signed)
A call was placed to the admitting doctors concerning the fall   No one has responded yet.  The pt reports that he does not hurt anywhere  He reports that he just lost his balance and fell  The edp dr Denton Lank was notified of the fall also

## 2020-06-17 NOTE — Progress Notes (Addendum)
Received page at 6:31 PM, informed that patient had a fall shortly before.  When we left his room around 5:30 PM, he had both guardrails up and bed lowered, we placed call button within reach.  We evaluated the patient at bedside.  States that he had swung his legs over the side of the bed to urinate into the urinal. Unfortunately because of his weakness, he slid off and landed on the floor on his bottom.  States that he fell slowly and was not hurt.  Denies striking his head or losing consciousness.  Denies any pain at this time.  He is alert and oriented. No evidence of significant trauma or deformity on exam.  Nursing staff has since applied external catheter.  Discussed with him to use urinal while lying in bed in the future.  He understands.

## 2020-06-17 NOTE — ED Notes (Signed)
Pt requested to leave AMA> MD made aware.

## 2020-06-17 NOTE — ED Notes (Signed)
Pt transported to CT ?

## 2020-06-17 NOTE — ED Notes (Signed)
Per  Dr.Johnson, PT can leave AMA.

## 2020-06-17 NOTE — ED Notes (Signed)
The pt denies being a diabetic

## 2020-06-17 NOTE — ED Notes (Signed)
Medication held til transported to MRI

## 2020-06-17 NOTE — ED Notes (Signed)
Admitting doctor has called me back  Concerning the pts fall and his diet+

## 2020-06-17 NOTE — ED Triage Notes (Signed)
Pt states he was seen at [facility] ED last night for a stroke and they wanted to send him to [facility] and he declined.  Pt went to bed around 9-10pm last night and states he woke up at midnight with L sided weakness.  L arm drift.  VAN negative.

## 2020-06-17 NOTE — ED Notes (Signed)
This EMT heard RN Wynona Canes Chriscoe speaking with the patient down the hall about not getting out of the bed. This EMT walked into the patient's room to find the patient sitting on the end of the bed. The patient stated that he wanted to get up and sit in the chair beside the bed. The patient was informed that it was not safe and that he was not to get up out of the bed. The patient stated, "I am going to sit in that chair." The patient was advised again that he could not sit in the chair due to his safety. The patient the stated, "I am not gonna do it." then proceeded to get up, jerk his body away from this EMT and RN Chriscoe and fell to the floor, hitting the chair then the floor. This EMT called for assistance on the radio. A team arrived and assisted the patient back into the bed. EMT Lilli and this EMT placed the patient on a posey bed alarm. This EMT also replaced the patient's external catheter.

## 2020-06-17 NOTE — Consult Note (Addendum)
[facility] TeleNeurology Consult Services  Stat Consult  Date of Service:   06/17/2020 02:33:27  Diagnosis:     .  I63.9 - Cerebrovascular accident (CVA), unspecified mechanism (HCC)  Impression: 60yoF Hx of CVA, HTN, DM presented with left sided weakness and slurred speech. Out of window for tPa. Recommend obtain CTA and CTP for possible LVO. Will need admission for stroke workup.  Our recommendations are outlined below.  Diagnostic Studies: Recommend MRI brain without contrast  Laboratory Studies: Recommend Lipid panel Hemoglobin A1c  Medication: Initiate Aspirin 81 mg daily Statins for LDL goal less than 70 Permissive hypertension, Antihypertensives with prn for first 24-48 hrs post stroke onset. If BP greater than 220/120 give Labetalol IV or Vasotec IV  Nursing Recommendations: Telemetry, IV Fluids, avoid dextrose containing fluids, Maintain euglycemia Neuro checks q4 hrs x 24 hrs and then per shift Head of bed 30 degrees  Consultations: Recommend Speech therapy if failed dysphagia screen Physical therapy/Occupational therapy  DVT Prophylaxis: Choice of Primary Team  Disposition: Neurology will follow   Imaging: CTH neg for hemorrhage. Hypodensity at right basal ganglia region and subcortical area, more prominent compare to left. Pending official report. CTA neg for LVO.   Metrics: [facility] Notification Time: 06/17/2020 02:31:36 Stamp Time: 06/17/2020 02:33:27 Callback Response Time: 06/17/2020 02:34:18   ----------------------------------------------------------------------------------------------------  Chief Complaint: left side weakness  History of Present Illness: Patient is a 60 year old Male.  Patient went to sleep around 9PM. He was already having left jaw numbness and left leg weakness. He woke up and noticed left arm weakness as well. He is worried he had another stroke. Patient was unable to clearly stated his  last known well, possibly sometimes prior to sleep. He reports multiple stroke through the year. First stroke was before age 37 and unclear how much work up he received and if an etiology was found. He does not remember when he's last stroke was other than couple years ago and no residual deficit. PMH: CVA without residual symptoms (several years ago), DM, HTN. Currently on aspirin. FH: not sure. SH: current smoker. 60M Pt went to bed at 9pm, says he felt not quite right, then woke up about 1.5 hours ago with c/o numbness on left side of his face, along with weakness on whole left side, and clumsiness to his left arm coordination. Symptoms present on exam now, along with decreased grip strength on left side and pronator drift on left. TLKW: prior to 9pm on 4/29 HCT - completed   Anticoagulant use:  No  Antiplatelet use: Yes aspirin   Examination: BP(199/84), Pulse(62), Blood Glucose(134) 1A: Level of Consciousness - Alert; keenly responsive + 0 1B: Ask Month and Age - Could Not Answer Either Question Correctly + 2 1C: Blink Eyes & Squeeze Hands - Performs Both Tasks + 0 2: Test Horizontal Extraocular Movements - Normal + 0 3: Test Visual Fields - No Visual Loss + 0 4: Test Facial Palsy (Use Grimace if Obtunded) - Normal symmetry + 0 5A: Test Left Arm Motor Drift - Drift, but doesn't hit bed + 1 5B: Test Right Arm Motor Drift - No Drift for 10 Seconds + 0 6A: Test Left Leg Motor Drift - Drift, but doesn't hit bed + 1 6B: Test Right Leg Motor Drift - No Drift for 5 Seconds + 0 7: Test Limb Ataxia (FNF/Heel-Shin) - No Ataxia + 0 8: Test Sensation - Normal; No sensory loss + 0 9: Test Language/Aphasia - Normal; No aphasia + 0  10: Test Dysarthria - Mild-Moderate Dysarthria: Slurring but can be understood + 1 11: Test Extinction/Inattention - No abnormality + 0  NIHSS Score: 5     Patient / Family was informed the Neurology Consult would occur via TeleHealth consult by way of interactive  audio and video telecommunications and consented to receiving care in this manner.  Patient is being evaluated for possible acute neurologic impairment and high probability of imminent or life - threatening deterioration.I spent total of 30 minutes providing care to this patient, including time for face to face visit via telemedicine, review of medical records, imaging studies and discussion of findings with providers, the patient and / or family.   Dr Larkin Ina   [facility] [phone]  Case [id number]

## 2020-06-17 NOTE — Consult Note (Signed)
NEUROLOGY CONSULTATION NOTE   Date of service: June 17, 2020 Patient Name: Frank Luna MRN:  [id number] DOB:  February 15, 1961 Reason for consult: "Stroke" Requesting Provider: Tyson Alias, * _ _ _   _ __   _ __ _ _  __ __   _ __   __ _  History of Present Illness  Frank Luna is a 60 y.o. male with PMH significant for DM2, HTN, thrombocytopenia, prior stroke in 2017 with minimal to no residual symptoms, smoker ~1pack per day who presents with left sided weakness. Went to bed at 2100 on 06/15/20 and woke up the next day with these symptoms. Constant and acute onset. Was seen at [facility] ED, [facility] was negative but left AMA. Presents here with persistent symptoms. MRI Brain demonstrates a 1cm acute R corona radiata infarct.  Takes aspirin 81mg  daily at home, has family history of strokes.  MRS: 0 NIHSS: 2 TPA/Thrombectomy: outside window for both LKW:    ROS   Constitutional Denies weight loss, fever and chills.   HEENT Denies changes in vision and hearing.   Respiratory Denies SOB and cough.   CV Denies palpitations and CP   GI Denies abdominal pain, nausea, vomiting and diarrhea.   GU Denies dysuria and urinary frequency.   MSK Denies myalgia and joint pain.   Skin Denies rash and pruritus.   Neurological Denies headache and syncope.   Psychiatric Denies recent changes in mood. Denies anxiety and depression.    Past History   Past Medical History:  Diagnosis Date  . Diabetes (HCC)   . Hypertension   . Stroke (HCC)   . Thrombocytopenia (HCC)    History reviewed. No pertinent surgical history. Family History  Problem Relation Age of Onset  . Stroke Paternal Great-grandfather    Social History   Socioeconomic History  . Marital status: Married    Spouse name: Not on file  . Number of children: Not on file  . Years of education: Not on file  . Highest education level: Not on file  Occupational History  . Not on file  Tobacco Use  . Smoking status: Current  Every Day Smoker    Packs/day: 1.00    Types: Cigarettes  . Smokeless tobacco: Never Used  Vaping Use  . Vaping Use: Never used  Substance and Sexual Activity  . Alcohol use: No  . Drug use: No  . Sexual activity: Not on file  Other Topics Concern  . Not on file  Social History Narrative  . Not on file   Social Determinants of Health   Financial Resource Strain: Not on file  Food Insecurity: Not on file  Transportation Needs: Not on file  Physical Activity: Not on file  Stress: Not on file  Social Connections: Not on file   Allergies  Allergen Reactions  . Penicillins     Unknown reaction    Medications  (Not in a hospital admission)    Vitals   Vitals:   06/17/20 1830 06/17/20 1845 06/17/20 1900 06/17/20 1915  BP: (!) 183/97 (!) 157/80 (!) 179/89   Pulse: 77 75 78 78  Resp: 11 18 (!) 23 (!) 21  Temp:      TempSrc:      SpO2: 95% 92% 94% 94%     There is no height or weight on file to calculate BMI.  Physical Exam   General: Laying comfortably in bed; in no acute distress.  HENT: Normal oropharynx and mucosa. Normal external  appearance of ears and nose.  Neck: Supple, no pain or tenderness  CV: No JVD. No peripheral edema.  Pulmonary: Symmetric Chest rise. Somewhat tachypneic. Abdomen: Soft to touch, non-tender.  Ext: No cyanosis, edema, or deformity  Skin: No rash. Normal palpation of skin.   Musculoskeletal: Normal digits and nails by inspection. No clubbing.  Neurologic Examination  Mental status/Cognition: Mildly drowsy but wakes up more during my exam, oriented to self, place, month and year, poor attention.  Speech/language: Fluent, comprehension intact, object naming intact, repetition intact.  Cranial nerves:   CN II Pupils equal and reactive to light, no VF deficits    CN III,IV,VI EOM intact, no gaze preference or deviation, no nystagmus    CN V normal sensation in V1, V2, and V3 segments bilaterally    CN VII no asymmetry, no nasolabial  fold flattening    CN VIII normal hearing to speech    CN IX & X normal palatal elevation, no uvular deviation    CN XI 5/5 head turn and 5/5 shoulder shrug bilaterally   CN XII midline tongue protrusion    Motor:  Muscle bulk: normal, tone normal, pronator drift yes in LUE Mvmt Root Nerve  Muscle Right Left Comments  SA C5/6 Ax Deltoid 5 3   EF C5/6 Mc Biceps 5 4+   EE C6/7/8 Rad Triceps 5 4+   WF C6/7 Med FCR     WE C7/8 PIN ECU     F Ab C8/T1 U ADM/FDI 5 4   HF L1/2/3 Fem Illopsoas 5 5   KE L2/3/4 Fem Quad 5 5   DF L4/5 D Peron Tib Ant 5 5   PF S1/2 Tibial Grc/Sol 5 5    Reflexes:  Right Left Comments  Pectoralis      Biceps (C5/6) 2 2   Brachioradialis (C5/6) 2 2    Triceps (C6/7) 2 2    Patellar (L3/4) 2 2    Achilles (S1)      Hoffman      Plantar     Jaw jerk    Sensation:  Light touch intact   Pin prick    Temperature    Vibration   Proprioception    Coordination/Complex Motor:  - Finger to Nose intact on the RUE, ataxic in LUE - Heel to shin intact BL - Rapid alternating movement unable to do in L hand. - Gait: deferred.  Labs reviewed   CBC:  Recent Labs  Lab 06/17/20 0203 06/17/20 0229  WBC 6.6  --   NEUTROABS 3.7  --   HGB 16.7 16.0  HCT 50.3 47.0  MCV 90.0  --   PLT 85*  --     Basic Metabolic Panel:  Lab Results  Component Value Date   NA 140 06/17/2020   K 3.7 06/17/2020   CO2 27 06/17/2020   GLUCOSE 127 (H) 06/17/2020   BUN 13 06/17/2020   CREATININE 1.00 06/17/2020   CALCIUM 8.9 06/17/2020   GFRNONAA >60 06/17/2020   GFRAA >60 06/08/2019   Lipid Panel:  Lab Results  Component Value Date   LDLCALC 37 06/17/2020   HgbA1c:  Lab Results  Component Value Date   HGBA1C 8.3 (H) 06/17/2020   Urine Drug Screen:     Component Value Date/Time   LABOPIA NONE DETECTED 06/17/2020 0815   COCAINSCRNUR NONE DETECTED 06/17/2020 0815   LABBENZ NONE DETECTED 06/17/2020 0815   AMPHETMU NONE DETECTED 06/17/2020 0815   THCU NONE  DETECTED 06/17/2020  0815   LABBARB NONE DETECTED 06/17/2020 0815    Alcohol Level     Component Value Date/Time   [employer] <10 06/17/2020 0203    Personally following studies. CT angio Head and Neck with contrast: 1. Negative for emergent large vessel occlusion. 2. 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 3. Mild posterior circulation atherosclerosis otherwise, mild bilateral P1 stenoses. 4. Bilateral carotid atherosclerosis but no hemodynamically significant stenosis (mild supraclinoid ICA stenosis). 5. Progressed left middle ear opacification since last year with chronic left mastoid effusion. 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 No discrete nasopharyngeal mass. Recommend ENT follow-up for left eustachian tube dysfunction. 6.  Aortic Atherosclerosis (ICD10-I70.0).  MRI Brain: 1 cm acute infarction in the corona radiata white matter on the right. Extensive chronic small-vessel ischemic changes elsewhere throughout brain as outlined above. Bilateral mastoid effusions.    Impression   Frank Luna is a 60 y.o. male with PMH significant for DM2, HTN, thrombocytopenia, prior stroke in 2017 with minimal to no residual symptoms, smoker ~1pack per day who presents with left sided weakness. Found to have a R corona radiate stroke about 1cm in size. His neurologic examination is notable for LUE weakness specifically with shoulder abduction, hand grip and finger adduction.  Recommendations  Plan:  - Frequent Neuro checks per stroke unit protocol - MRI Brain with 1cm acute corona radiata infarct. - CTA with no LVO, persistent trigeminal supplies distal basilar, moderate stenosis of R vert. - TTE with bubble study pending. - Lipid panel with LDL of 37 - HbA1c of 8.3 - Antithrombotic - Aspirin 81mg  daily.  Will hold off on adding a second antiplatelet agent due to thrombocytopenia. - Recommend DVT ppx - SBP goal - gradual normotension, LKW is now greater than 24 hours ago. - Recommend Telemetry monitoring for arrythmia - Recommend bedside swallow screen prior to PO intake. - Stroke education booklet - Recommend PT/OT/SLP consult - Urine Tox screen negative.  Smoking: Counseled on the importance of quitting smoking to reduce risk of strokes.  ______________________________________________________________________   Thank you for the opportunity to take part in the care of this patient. If you have any further questions, please contact the neurology consultation attending.  Signed,  [facility] Pager Number Erick Blinks _ _ _   _ __   _ __ _ _  __ __   _ __   __ _

## 2020-06-17 NOTE — ED Notes (Signed)
The pt was found siting on the end of his stretcher as I walked by his room I  Went in to ask him to get back further on the bed.  The pt reported that he wanted to get up and sit in the bedside chair.  The pt refused and kept repeating that he was going to sit in the chair by this time the nurse tech rachel was in the room also holding his lt arm I was holding his rt. He thenreports that he was going to sit in the chair he jerked loose from both of [place] staggered back against the wall and fell onto the floor striking his head against the wall and striking his lt elbow ont eh furniture he has an abrasion to the medial elbow area  Admitting doctors called and they saw the pt.

## 2020-06-17 NOTE — ED Triage Notes (Signed)
Pt from home with c/o L sided weakness. States he went to bed between 9 and 10pm and felt numbness to L side of face but that when he woke up (around 0145), his entire L side was weak and he was having difficulty walking. Dr. Blinda Leatherwood @ bedside during triage. Pt taken to CT at this time.

## 2020-06-17 NOTE — ED Notes (Signed)
The pt fell in the floor  Whenever he got out of bed to void.  Other staff found him  Alert talking  During the episode and during  He was helped back to bed he has weakness lt arm and lt leg since last pm

## 2020-06-17 NOTE — Progress Notes (Signed)
Page received at 6:53 PM about a second fall.  See nursing staff documentation but in short, he was found sitting at the edge of the stretcher and when approached stated that he wanted to get up and sit in the chair.  They tried to have him sit down but he refused.  Attempted to help him back into bed, but he ripped loose from the nurse and tech and fell against the wall, striking his head and his elbow.  On our exam, he has a 1.5 centimeter abrasion on the left elbow with small amount of bleeding.  His strength in the left upper extremity is unchanged from exam earlier, but is having mild pain with opposed motions.  Denies any headache.  No new neurologic changes.  Will order x-ray of elbow and CT of head.  Discussed with patient that he needs to remain in bed because of his leg weakness which he understands.  States he will remain in bed rest of the night.  Left room with guardrails up again, bed lowered.  Ordered bedrest 24 hours.  We will follow-up imaging.

## 2020-06-17 NOTE — ED Notes (Signed)
Pt refused vital signs.

## 2020-06-17 NOTE — H&P (Addendum)
Date: 06/17/2020               Patient Name:  Frank Luna MRN: [id number]  DOB: [birth year] Age / Sex: 60 y.o., male   PCP: Graylin Shiver [place]         Medical Service: Internal Medicine Teaching Service         Attending Physician: Dr. Erlinda Hong    First Contact: Dr. Imogene Burn Pager: [phone]  Second Contact: Dr. Sande Brothers Pager: [phone]       After Hours (After 5p/  First Contact Pager: [phone]  weekends / holidays): Second Contact Pager: [phone]   Chief Complaint: Left-sided weakness  History of Present Illness:   Frank Luna is a 60 y.o. male with hx of CVA, hypertension, heavy smoking, diabetes presenting for worsening left-sided weakness.  He reports having some left-sided weakness since his stroke in 2017, but noticed worsening weakness and numbness yesterday night around 9 PM.  No other neurologic symptoms, no headache.  Was in his recent state of health otherwise.  He was seen for these last night at [facility] and work-up was initiated, but he left AMA around 7 AM to smoke per patient.  CT head with no acute abnormalities, CTA was negative for large vessel occlusion but did show stenosis of multiple vessels elsewhere.  Plan to transfer patient to [facility] for MRI, but he refused and left AMA.  He reportedly takes medications every day, including Plavix, Lipitor, metoprolol.  does not always take his aspirin because he was confused about how he was supposed to use it.  Reportedly takes Plavix daily, but has contradicted this when speaking to the providers today.  ED Course: Neurology consult pending.  MRI demonstrated acute infarct in the right corona radiata.  Current Outpatient Medications  Medication Instructions  . acetaminophen (TYLENOL) 1,000 mg, Oral, Every 6 hours PRN  . aspirin 325 mg, Oral, Daily  . atorvastatin (LIPITOR) 40 mg, Oral, Daily-1800  . clopidogrel (PLAVIX) 75 mg, Oral, Daily  . Farxiga 10 mg, Oral, Daily  .  glipiZIDE-metformin (METAGLIP) 5-500 MG tablet 2 tablets, Oral, 2 times daily before meals  . metoprolol succinate (TOPROL-XL) 50 mg, Oral, Daily, Take with or immediately following a meal.   Reportedly does not take Metaglip anymore, started Lantus 15 units recently.  Allergies as of 06/17/2020 - Review Complete 06/17/2020  Allergen Reaction Noted  . Penicillins  01/25/2012    Past Medical History:  Diagnosis Date  . Diabetes (HCC)   . Hypertension   . Stroke [facility])     History reviewed. No pertinent surgical history.  Social History   Tobacco Use  . Smoking status: Current Every Day Smoker    Packs/day: 1.00    Types: Cigarettes  . Smokeless tobacco: Never Used  Vaping Use  . Vaping Use: Never used  Substance Use Topics  . Alcohol use: No  . 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  HENT: Negative for congestion and sore throat.   Respiratory: Negative for cough and shortness of breath.   All other systems reviewed and are negative.   Physical Exam: Blood pressure (!) 178/98, pulse (!) 58, temperature 98.2 F (36.8 C), temperature source Oral, resp. rate 18, SpO2 98 %. Physical Exam Vitals and nursing note reviewed.  Constitutional:  General: He is not in acute distress.    Appearance: Normal appearance. He is obese. He is not ill-appearing.  HENT:     Head: Normocephalic and atraumatic.     Right Ear: External ear normal.     Left Ear: External ear normal.     Mouth/Throat:     Mouth: Mucous membranes are moist.  Eyes:     Extraocular Movements: Extraocular movements intact.     Pupils: Pupils are equal, round, and reactive to light.     Comments: Mildly injected conjunctiva bilaterally  Cardiovascular:     Rate and Rhythm: Normal rate and regular rhythm.     Pulses: Normal pulses.     Heart  sounds: No murmur heard. No friction rub. No gallop.   Pulmonary:     Effort: Pulmonary effort is normal. No respiratory distress.     Breath sounds: No wheezing, rhonchi or rales.  Abdominal:     General: Abdomen is flat.     Palpations: Abdomen is soft.     Tenderness: There is no abdominal tenderness. There is no guarding or rebound.  Musculoskeletal:        General: Normal range of motion.     Cervical back: Normal range of motion.     Right lower leg: No edema.     Left lower leg: No edema.  Skin:    General: Skin is warm and dry.  Neurological:     Mental Status: He is alert and oriented to person, place, and time.     Comments: Cranial nerves intact Strong accent but no aphasia Extraocular movements intact, saccadic eye movements Left upper extremity strength 3 out of 5 Left lower extremity strength 4 out of 5 Sensation intact throughout No dysmetria  Psychiatric:        Mood and Affect: Mood normal.        Behavior: Behavior normal.      EKG: personally reviewed my interpretation is normal sinus rhythm, no ST changes, normal QT interval, possible left bundle branch block  CT HEAD WO CONTRAST  Result Date: 06/17/2020 CLINICAL DATA:  Left-sided arm and leg weakness. EXAM: CT HEAD WITHOUT CONTRAST TECHNIQUE: Contiguous axial images were obtained from the base of the skull through the vertex without intravenous contrast. COMPARISON:  February 04, 2016 FINDINGS: Brain: There is mild cerebral atrophy with widening of the extra-axial spaces and ventricular dilatation. There are areas of decreased attenuation within the white matter tracts of the supratentorial brain, consistent with microvascular disease changes. A chronic left basal ganglia lacunar infarct is seen. Vascular: No hyperdense vessel or unexpected calcification. Skull: Normal. Negative for fracture or focal lesion. Sinuses/Orbits: There is mild right maxillary sinus mucosal thickening. Other: None. IMPRESSION: 1.  Generalized cerebral atrophy. 2. No acute intracranial abnormality. Electronically Signed   By: Aram Candela M.D.   On: 06/17/2020 04:29   MR BRAIN WO CONTRAST  Result Date: 06/17/2020 CLINICAL DATA:  Left-sided arm and leg weakness. EXAM: MRI HEAD WITHOUT CONTRAST TECHNIQUE: Multiplanar, multiecho pulse sequences of the brain and surrounding structures were obtained without intravenous contrast. COMPARISON:  Negative acute CT evaluations for 202021 and earlier today. FINDINGS: Brain: Study suffers from considerable motion degradation. There is a 1 cm acute infarction within the white matter of the corona radiography on the right. No other acute infarction. Extensive chronic small-vessel ischemic changes affect the pons. No focal cerebellar infarction. Old small vessel infarctions are present affecting the thalami, basal ganglia and hemispheric white matter. No  large vessel territory infarction. No mass, hemorrhage, hydrocephalus or extra-axial collection. Vascular: Major vessels at the base of the brain show flow. Skull and upper cervical spine: Negative Sinuses/Orbits: Clear/normal Other: Bilateral mastoid effusions. IMPRESSION: 1 cm acute infarction in the corona radiata white matter on the right. Extensive chronic small-vessel ischemic changes elsewhere throughout brain as outlined above. Bilateral mastoid effusions. Electronically Signed   By: Paulina Fusi M.D.   On: 06/17/2020 16:12   CT ANGIO HEAD NECK W WO CM W PERF  Result Date: 06/17/2020 CLINICAL DATA:  60 year old male with left side numbness and weakness upon waking. 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 EXAM: CT ANGIOGRAPHY HEAD AND NECK TECHNIQUE: Multidetector CT imaging of the head and neck was performed using the standard protocol during bolus administration of intravenous contrast. Multiplanar CT image reconstructions and MIPs were obtained to evaluate the vascular anatomy.  Carotid stenosis measurements (when applicable) are obtained utilizing NASCET criteria, using the distal internal carotid diameter as the denominator. CONTRAST:  OMNIPAQUE IOHEXOL 350 MG/ML SOLN COMPARISON:  CTA head and neck and CT brain perfusion 06/08/2019. CTA head and neck 02/04/2016. FINDINGS: CTA NECK Skeleton: Carious dentition. Progressive left middle ear opacification since last year. Chronic mastoid effusions greater on the left. No acute osseous abnormality identified. Upper chest: Stable upper lungs, mild pulmonary scarring. No superior mediastinal lymphadenopathy. Other neck: Partially effaced pharynx similar to last year. Chronic adenoid soft tissue enlargement has not significantly changed. No discrete nasopharynx mass or hyperenhancement. Negative parapharyngeal and retropharyngeal spaces. No cervical lymphadenopathy identified. Aortic arch: Calcified aortic atherosclerosis. 3 vessel arch configuration. Right carotid system: Mild right CCA plaque without stenosis. Mild to moderate soft and calcified plaque at the right carotid bifurcation through ICA bulb without stenosis. Left carotid system: Minimal left CCA soft plaque without stenosis. Mild mostly calcified plaque at the left carotid bifurcation and ICA origin without stenosis. Vertebral arteries: Proximal subclavian arteries are patent with mild plaque and no significant stenosis. Both vertebral arteries are diminutive, more so the right. There is chronic atherosclerosis at the right vertebral artery origin with at least moderate stenosis there, stable. Left vertebral artery origin is within normal limits. Both diminutive vertebral arteries remain patent to the skull base without additional stenosis. CTA HEAD Posterior circulation: Diminutive right vertebral artery terminates in PICA. Left PICA origin is patent. Diminutive left V4 segment continues to the vertebrobasilar junction. 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 This appearance unchanged since 2017 and likely congenital. Both posterior communicating arteries are present, the left is larger. There is mild bilateral P1 irregularity and stenosis. PCA branches are within normal limits. Anterior circulation: Both ICA siphons are patent. On the left mild to moderate calcified atherosclerosis results in mild supraclinoid stenosis. On the right similar mild to moderate calcified plaque and mild supraclinoid stenosis. Right side persistent trigeminal artery also with mild calcified plaque. Normal ophthalmic and posterior communicating artery origins. Patent carotid termini. Patent MCA and ACA origins. Mild new left ACA A1 segment stenosis on series 14, image 22. Diminutive anterior communicating artery. Mildly fenestrated proximal left A2 (normal variant). Bilateral ACA branches are stable and within normal limits. Left MCA M1 segment and bifurcation are patent without stenosis. Right MCA M1 segment and bifurcation are patent without stenosis. Bilateral MCA branches are stable and within normal limits.  Venous sinuses: Early contrast timing.  Grossly patent. 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 Right vertebral artery terminates in PICA. Review of the MIP images confirms the above findings IMPRESSION: 1. Negative for emergent large vessel occlusion. 2. 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 3. Mild posterior circulation atherosclerosis otherwise, mild bilateral P1 stenoses. 4. Bilateral carotid atherosclerosis but no hemodynamically significant stenosis (mild supraclinoid ICA stenosis). 5. Progressed left middle ear opacification since last year with chronic left mastoid  effusion. 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 No discrete nasopharyngeal mass. Recommend ENT follow-up for left eustachian tube dysfunction. 6.  Aortic Atherosclerosis (ICD10-I70.0). Electronically Signed   By: Odessa FlemingH  Hall M.D.   On: 06/17/2020 05:59    Assessment & Plan by Problem: Active Problems:   Stroke (HCC)   Paulino RilyDanny Nath is a 60 y.o. male with hx of stroke, TIA, hypertension, diabetes presenting for worsening left-sided weakness, found to have stroke on MRI.  Acute stroke of the right corona radiata CT head negative for acute bleed. MRI demonstrated 1 cm acute infarction in the corona radiata white matter on the right.  Also extensive chronic small vessel disease elsewhere.  CTA demonstrated no acute large vessel occlusion, but there was moderate stenosis in multiple vessels. Had prior stroke in 2017 for which he received tPA, patient states he has had some left-sided weakness since that.  Discharged with aspirin, atorvastatin, metoprolol for blood pressure.  Counseled on smoking cessation at the time.  Had another TIA on 06/08/2019 was prescribed Plavix from the ED His work-up was started at [facility] early this morning for which he left AMA.  Significant for A1c of 8.3, lipid panel benign, TSH normal.  CTA demonstrated moderate stenosis of right vertebral artery, mild posterior circulation atherosclerosis, bilateral carotid atherosclerosis but no hemodynamically significant stenosis.  - Neurology consulted, appreciate further recommendations - Permissive hypertension - PT/OT - Echo - Telemetry - Continue home atorvastatin for secondary stroke prevention - Continue home aspirin and Plavix  Insulin-dependent diabetes diabetes A1c of 8.3 this admission, last recording was 6.0 in 2017.  Reportedly takes [place] and Lantus 15 units at home. - Start Lantus 12 units nightly - SSI  Hypertension Home medication of metoprolol. -  Allowing permissive hypertension for acute stroke  Thrombocytopenia Chronic, as far back as 2013. Patient did not know of history of this, but not very reliable historian, - f/u blood smear  Vitamin D deficiency Vitamin D level of 14.5. - Start vitamin D 1000 units daily - Recheck level in about 3 months  Left eustachian tube dysfunction Noted on CTA.  No symptoms consistent with this. - Consider outpatient referral to ENT   History of OSA Patient states he had this issue in the past, but has improved.  Never had a CPAP at home.  Denies snoring, nighttime awakening.  He feels rested in the morning.  Smoking Patient currently states that he will quit.  Has tried to quit before.  Has nicotine patches at home.  No formal diagnosis of COPD but states he is sometimes short of breath.  No wheezing on exam today. - Counseled on smoking cessation - Can order nicotine patches if needed - Will need COPD evaluation outpatient with PFTs   Dispo: Admit patient to Observation with expected length of stay less than 2 midnights.  Signed: Tonye Royaltyhen, Xadrian Craighead  Y, MD 06/17/2020, 5:58 PM  Pager: [phone]  After 5pm on weekdays and 1pm on weekends: On Call pager: [phone]

## 2020-06-18 ENCOUNTER — Observation Stay (HOSPITAL_BASED_OUTPATIENT_CLINIC_OR_DEPARTMENT_OTHER): Payer: Medicaid Other

## 2020-06-18 DIAGNOSIS — W1830XA Fall on same level, unspecified, initial encounter: Secondary | ICD-10-CM | POA: Diagnosis not present

## 2020-06-18 DIAGNOSIS — Y92230 Patient room in hospital as the place of occurrence of the external cause: Secondary | ICD-10-CM | POA: Diagnosis not present

## 2020-06-18 DIAGNOSIS — E876 Hypokalemia: Secondary | ICD-10-CM | POA: Diagnosis present

## 2020-06-18 DIAGNOSIS — G8194 Hemiplegia, unspecified affecting left nondominant side: Secondary | ICD-10-CM | POA: Diagnosis present

## 2020-06-18 DIAGNOSIS — R29810 Facial weakness: Secondary | ICD-10-CM | POA: Diagnosis present

## 2020-06-18 DIAGNOSIS — I63031 Cerebral infarction due to thrombosis of right carotid artery: Secondary | ICD-10-CM | POA: Diagnosis not present

## 2020-06-18 DIAGNOSIS — Z79899 Other long term (current) drug therapy: Secondary | ICD-10-CM | POA: Diagnosis not present

## 2020-06-18 DIAGNOSIS — Z20822 Contact with and (suspected) exposure to covid-19: Secondary | ICD-10-CM | POA: Diagnosis present

## 2020-06-18 DIAGNOSIS — E559 Vitamin D deficiency, unspecified: Secondary | ICD-10-CM | POA: Diagnosis present

## 2020-06-18 DIAGNOSIS — E785 Hyperlipidemia, unspecified: Secondary | ICD-10-CM | POA: Diagnosis present

## 2020-06-18 DIAGNOSIS — E1165 Type 2 diabetes mellitus with hyperglycemia: Secondary | ICD-10-CM | POA: Diagnosis not present

## 2020-06-18 DIAGNOSIS — Z7984 Long term (current) use of oral hypoglycemic drugs: Secondary | ICD-10-CM | POA: Diagnosis not present

## 2020-06-18 DIAGNOSIS — E8809 Other disorders of plasma-protein metabolism, not elsewhere classified: Secondary | ICD-10-CM | POA: Diagnosis not present

## 2020-06-18 DIAGNOSIS — I639 Cerebral infarction, unspecified: Secondary | ICD-10-CM | POA: Diagnosis not present

## 2020-06-18 DIAGNOSIS — H6982 Other specified disorders of Eustachian tube, left ear: Secondary | ICD-10-CM | POA: Diagnosis present

## 2020-06-18 DIAGNOSIS — R531 Weakness: Secondary | ICD-10-CM | POA: Diagnosis present

## 2020-06-18 DIAGNOSIS — K5900 Constipation, unspecified: Secondary | ICD-10-CM | POA: Diagnosis present

## 2020-06-18 DIAGNOSIS — E46 Unspecified protein-calorie malnutrition: Secondary | ICD-10-CM | POA: Diagnosis not present

## 2020-06-18 DIAGNOSIS — I6381 Other cerebral infarction due to occlusion or stenosis of small artery: Secondary | ICD-10-CM | POA: Diagnosis present

## 2020-06-18 DIAGNOSIS — F17210 Nicotine dependence, cigarettes, uncomplicated: Secondary | ICD-10-CM | POA: Diagnosis present

## 2020-06-18 DIAGNOSIS — I679 Cerebrovascular disease, unspecified: Secondary | ICD-10-CM | POA: Diagnosis not present

## 2020-06-18 DIAGNOSIS — Z823 Family history of stroke: Secondary | ICD-10-CM | POA: Diagnosis not present

## 2020-06-18 DIAGNOSIS — G8320 Monoplegia of upper limb affecting unspecified side: Secondary | ICD-10-CM | POA: Diagnosis not present

## 2020-06-18 DIAGNOSIS — Z7902 Long term (current) use of antithrombotics/antiplatelets: Secondary | ICD-10-CM | POA: Diagnosis not present

## 2020-06-18 DIAGNOSIS — S50312A Abrasion of left elbow, initial encounter: Secondary | ICD-10-CM | POA: Diagnosis not present

## 2020-06-18 DIAGNOSIS — D6959 Other secondary thrombocytopenia: Secondary | ICD-10-CM | POA: Diagnosis present

## 2020-06-18 DIAGNOSIS — E663 Overweight: Secondary | ICD-10-CM | POA: Diagnosis present

## 2020-06-18 DIAGNOSIS — I700 Atherosclerosis of aorta: Secondary | ICD-10-CM | POA: Diagnosis present

## 2020-06-18 DIAGNOSIS — I10 Essential (primary) hypertension: Secondary | ICD-10-CM | POA: Diagnosis not present

## 2020-06-18 DIAGNOSIS — I6389 Other cerebral infarction: Secondary | ICD-10-CM

## 2020-06-18 DIAGNOSIS — Z9119 Patient's noncompliance with other medical treatment and regimen: Secondary | ICD-10-CM | POA: Diagnosis not present

## 2020-06-18 DIAGNOSIS — R29702 NIHSS score 2: Secondary | ICD-10-CM | POA: Diagnosis present

## 2020-06-18 DIAGNOSIS — Z7982 Long term (current) use of aspirin: Secondary | ICD-10-CM | POA: Diagnosis not present

## 2020-06-18 DIAGNOSIS — Z6828 Body mass index (BMI) 28.0-28.9, adult: Secondary | ICD-10-CM | POA: Diagnosis not present

## 2020-06-18 DIAGNOSIS — Z794 Long term (current) use of insulin: Secondary | ICD-10-CM | POA: Diagnosis not present

## 2020-06-18 DIAGNOSIS — R7309 Other abnormal glucose: Secondary | ICD-10-CM | POA: Diagnosis not present

## 2020-06-18 DIAGNOSIS — D696 Thrombocytopenia, unspecified: Secondary | ICD-10-CM | POA: Diagnosis not present

## 2020-06-18 DIAGNOSIS — E1159 Type 2 diabetes mellitus with other circulatory complications: Secondary | ICD-10-CM | POA: Diagnosis not present

## 2020-06-18 LAB — BASIC METABOLIC PANEL
Anion gap: 9 (ref 5–15)
BUN: 8 mg/dL (ref 6–20)
CO2: 28 mmol/L (ref 22–32)
Calcium: 8.8 mg/dL — ABNORMAL LOW (ref 8.9–10.3)
Chloride: 102 mmol/L (ref 98–111)
Creatinine, Ser: 0.88 mg/dL (ref 0.61–1.24)
GFR, Estimated: >60 mL/min (ref >60)
Glucose, Bld: 73 mg/dL (ref 70–99)
Potassium: 3 mmol/L — ABNORMAL LOW (ref 3.5–5.1)
Sodium: 139 mmol/L (ref 135–145)

## 2020-06-18 LAB — CBC
HCT: 47.7 % (ref 39.0–52.0)
Hemoglobin: 16.2 g/dL (ref 13.0–17.0)
MCH: 29.6 pg (ref 26.0–34.0)
MCHC: 34 g/dL (ref 30.0–36.0)
MCV: 87 fL (ref 80.0–100.0)
Platelets: 81 10*3/uL — ABNORMAL LOW (ref 150–400)
RBC: 5.48 MIL/uL (ref 4.22–5.81)
RDW: 12.7 % (ref 11.5–15.5)
WBC: 7.4 10*3/uL (ref 4.0–10.5)
nRBC: 0 % (ref 0.0–0.2)

## 2020-06-18 LAB — GLUCOSE, CAPILLARY
Glucose-Capillary: 103 mg/dL — ABNORMAL HIGH (ref 70–99)
Glucose-Capillary: 129 mg/dL — ABNORMAL HIGH (ref 70–99)
Glucose-Capillary: 174 mg/dL — ABNORMAL HIGH (ref 70–99)

## 2020-06-18 LAB — ECHOCARDIOGRAM COMPLETE BUBBLE STUDY
Area-P 1/2: 3.99 cm2
S' Lateral: 3 cm

## 2020-06-18 MED ORDER — ENOXAPARIN SODIUM 40 MG/0.4ML IJ SOSY
40.0000 mg | PREFILLED_SYRINGE | INTRAMUSCULAR | Status: DC
  Administered 2020-06-18 – 2020-06-20 (×3): 40 mg via SUBCUTANEOUS
  Filled 2020-06-18 (×3): qty 0.4

## 2020-06-18 MED ORDER — LISINOPRIL 20 MG PO TABS
40.0000 mg | ORAL_TABLET | Freq: Every day | ORAL | Status: DC
  Administered 2020-06-18 – 2020-06-21 (×4): 40 mg via ORAL
  Filled 2020-06-18 (×4): qty 2

## 2020-06-18 MED ORDER — CLOPIDOGREL BISULFATE 75 MG PO TABS
75.0000 mg | ORAL_TABLET | Freq: Every day | ORAL | Status: DC
  Administered 2020-06-18 – 2020-06-21 (×4): 75 mg via ORAL
  Filled 2020-06-18 (×4): qty 1

## 2020-06-18 MED ORDER — POTASSIUM CHLORIDE 20 MEQ PO PACK
40.0000 meq | PACK | Freq: Once | ORAL | Status: AC
  Administered 2020-06-18: 40 meq via ORAL
  Filled 2020-06-18: qty 2

## 2020-06-18 MED ORDER — METOPROLOL SUCCINATE ER 50 MG PO TB24
75.0000 mg | ORAL_TABLET | Freq: Every day | ORAL | Status: DC
  Administered 2020-06-18 – 2020-06-21 (×4): 75 mg via ORAL
  Filled 2020-06-18 (×4): qty 1

## 2020-06-18 NOTE — Progress Notes (Signed)
Obtained personal belonging including wallet with [phone] in sealed package from security office. Returned to patient's wife who will be taking items home. Return slip signed and placed in chart.

## 2020-06-18 NOTE — Progress Notes (Addendum)
   Subjective: Pt is seen lying on the bed. States his weakness is getting better.  Denies any new symptoms.  No new complaints.  States he was able to swallow.   Objective:  Vital signs in last 24 hours: Vitals:   06/17/20 1930 06/18/20 0423 06/18/20 0741 06/18/20 1129  BP:  (!) 188/96 (!) 195/89 (!) 167/79  Pulse:  86 83 77  Resp:  18 18 18   Temp: 98.4 F (36.9 C) 97.9 F (36.6 C) 97.9 F (36.6 C) 98 F (36.7 C)  TempSrc:  Oral Oral Oral  SpO2:  94% 95% 95%    Physical Exam Constitutional: no acute distress Head: atraumatic ENT: external ears normal Eyes: EOMI Cardiovascular: regular rate and rhythm, normal heart sounds Pulmonary: effort normal, lungs clear to ascultation bilaterally Abdominal: flat, nontender, no rebound tenderness, bowel sounds normal Musculoskeletal: Range of motion good Skin: warm and dry Neurological: alert, Cranial nerves intact no aphasia Extraocular movements intact, saccadic eye movements Left upper extremity strength 4 out of 5, weakness demonstrated on small muscles of Left hand.   Left lower extremity strength 4 out of 5 Sensation intact throughout No dysmetria  Psychiatric: normal mood and affect  Assessment/Plan: Frank Luna is a 60 y.o. male with hx of stroke, TIA, hypertension, diabetes presenting for worsening left-sided weakness, found to have stroke on MRI.  Principal Problem:   Acute cerebral infarction [facility]) Active Problems:   Tobacco use disorder   Essential hypertension   Diabetes (HCC)  Acute stroke of the right corona radiata CT head negative for acute bleed. MRI demonstrated 1 cm acute infarction in the corona radiata white matter on the right.  Also extensive chronic small vessel disease elsewhere.  CTA demonstrated moderate stenosis of right vertebral artery, mild posterior circulation atherosclerosis, bilateral carotid atherosclerosis but no hemodynamically significant stenosis.   Neurology consulted, appreciate  further recommendations - Permissive hypertension - PT/OT- PT recommend CIR, 24 hours supervision assistance - ECHO pending - Telemetry - Continue home atorvastatin for secondary stroke prevention - Continue home aspirin 81 mg.  -- Started Plavix 75 mg today again per Neurology . High Fall risk Pt had 2 falls back to back yesterday evening when he was trying to sit on chair. He hit his head and Left elbow.  X ray left Elbow and CT head done which was normal. -Fall precautions.  Insulin-dependent diabetes diabetes A1c 8.3 at admission. BG today 73.  -On Lantus 12 units nightly  Hypertension Allowing Permissive hypertension.  Hypokalemia K is 3.0 -KLOR packet 40 mEq once.   Thrombocytopenia Chronic, Platelets today 81 decreased from yesterday 85. -Pathologist smear- pending.  Diet:  Thin  IVF:  None VTE:  SCD's Prior to Admission Living Arrangement:  Home Anticipated Discharge Location:  Home Barriers to Discharge: Medical workup Dispo: Anticipated discharge in approximately 1-2 day(s).   [facility], MD 06/18/2020, 12:27 PM   On Call pager [phone]

## 2020-06-18 NOTE — Evaluation (Signed)
Physical Therapy Evaluation Patient Details Name: Frank Luna MRN: [id number] DOB: 02-25-1960 Today's Date: 06/18/2020   History of Present Illness  Pt is a 60 y.o. male who presented 4/30 with L-sided weakness and then left AMA then returned. Pt has hx of several prior CVA and TIA events, most recently left ED in April 2021 AMA after having TIA. Pt with poor compliance with medication. During this hospitalization, pt has sustained 2 falls secondary to pt not following directions and safety precautions placed by staff. MRI revealed 1 cm acute infarct in the corona radiata white matter on the R. L elbow and head imaging negative for acute findings following 2nd fall. PMH: CVA, HTN, DM, and thrombosytopenia.    Clinical Impression  Pt presents with condition above and deficits mentioned below, see PT Problem List. PTA, he was independent with all functional mobility and ADLs without AD/AE and living with his wife in a 1-story house with 5 STE and bil handrails. Currently, pt demonstrates significant cognitive deficits, including disorientation to time, motor planning, sequencing, STM, deficits awareness, and safety awareness that place him at high risk for subsequent falls and injuries. 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 Pt requiring minA to transition supine > sit EOB and minAx2 with bil HHA to take only a few side steps to the L to transfer to the chair this date. Pt would greatly benefit from intensive therapy in the CIR setting to maximize his safety and independence prior to return home. Will continue to follow acutely.    Follow Up Recommendations CIR;Supervision/Assistance - 24 hour    Equipment Recommendations  Rolling walker with 5" wheels;3in1 (PT)    Recommendations for Other Services Rehab consult     Precautions / Restrictions Precautions Precautions:  Fall Restrictions Weight Bearing Restrictions: No      Mobility  Bed Mobility Overal bed mobility: Needs Assistance Bed Mobility: Supine to Sit     Supine to sit: Min assist     General bed mobility comments: Pt using R bed rail to assist with trunk ascension initiation to sit up L EOB, swinging legs off bed but pt unable to push up from L elbow to complete trunk ascension without minA.    Transfers Overall transfer level: Needs assistance Equipment used: 2 person hand held assist Transfers: Sit to/from [employer] Sit to Stand: Min assist;+2 physical assistance;+2 safety/equipment Stand pivot transfers: Min assist;+2 physical assistance;+2 safety/equipment       General transfer comment: Cued pt for feet placement, with pt pushing up to stand, needing minAx2 for steadying due to trunk sway. MinAx2 with bil HHA to take side steps to L to transfer to recliner, needing verbal and visual cues for L foot placement and cues to weight shift and bring R to L foot.  Ambulation/Gait Ambulation/Gait assistance: Min assist;+2 physical assistance;+2 safety/equipment Gait Distance (Feet): 3 Feet Assistive device: 2 person hand held assist Gait Pattern/deviations: Decreased step length - left;Decreased stance time - left;Decreased stride length;Narrow base of support Gait velocity: reduced Gait velocity interpretation: <1.31 ft/sec, indicative of household ambulator General Gait Details: Several side steps to L with bil HHA minAx2, cuing pt via visual and verbal cues for L foot placement, intermittently bringing R foot too proximal resulting in very narrow BOS and increased trunk sway.  Stairs            Wheelchair Mobility    Modified Rankin (  Stroke Patients Only) Modified Rankin (Stroke Patients Only) Pre-Morbid Rankin Score: No symptoms Modified Rankin: Moderately severe disability     Balance Overall balance assessment: Needs assistance Sitting-balance  support: No upper extremity supported;Feet supported Sitting balance-Leahy Scale: Fair Sitting balance - Comments: Static sitting EOB no LOB, supervision for safety.   Standing balance support: Bilateral upper extremity supported;During functional activity Standing balance-Leahy Scale: Poor Standing balance comment: Reliant on bil UE support and external assist.                             Pertinent Vitals/Pain Pain Assessment: Faces Faces Pain Scale: Hurts a little bit Pain Location: L UE with mobility Pain Descriptors / Indicators: Heaviness Pain Intervention(s): Limited activity within patient's tolerance;Monitored during session;Repositioned    Home Living Family/patient expects to be discharged to:: Private residence Living Arrangements: Spouse/significant other Available Help at Discharge: Family Type of Home: House Home Access: Stairs to enter Entrance Stairs-Rails: Can reach both Entrance Stairs-Number of Steps: 5 Home Layout: One level Home Equipment: Shower seat      Prior Function Level of Independence: Independent         Comments: Pt was driving. Reports he was on disability.     Hand Dominance        Extremity/Trunk Assessment   Upper Extremity Assessment Upper Extremity Assessment: Defer to OT evaluation    Lower Extremity Assessment Lower Extremity Assessment: LLE deficits/detail LLE Deficits / Details: MMT scores of 4+ to 5, but impaired coordination limiting AROM with tasks; delayed proprioception detected in ankle and likely in knee, but not tested at knee LLE Sensation: decreased proprioception LLE Coordination: decreased gross motor;decreased fine motor (dysdiadochokinesia noted and possible dysmetria)    Cervical / Trunk Assessment Cervical / Trunk Assessment: Normal  Communication   Communication: No difficulties  Cognition Arousal/Alertness: Awake/alert Behavior During Therapy: WFL for tasks assessed/performed Overall  Cognitive Status: Impaired/Different from baseline Area of Impairment: Orientation;Memory;Following commands;Safety/judgement;Awareness;Problem solving                 Orientation Level: Disoriented to;Time (did not know month or year, states he uses his phone normally for that info)   Memory: Decreased short-term memory Following Commands: Follows one step commands consistently;Follows one step commands with increased time Safety/Judgement: Decreased awareness of safety;Decreased awareness of deficits Awareness: Intellectual Problem Solving: Slow processing;Decreased initiation;Difficulty sequencing;Requires verbal cues;Requires tactile cues General Comments: Pt disoriented to time and forgetting how to perform familiar tasks, like unlock phone or navigate screen to find current date. Pt with slow processing and needing simple multi-modal cues to sequence tasks. STM deficits and poor safety and deficits awareness, forgetting instruction to ask for assistance and not to get up alone with pt believing he is safe enough to transfer back to bed alone, which is not the case. Pt able to demonstrate understanding of pushing nurse call button for help, but unsure whether he will actually remember to do so when he wants anything.      General Comments      Exercises     Assessment/Plan    PT Assessment Patient needs continued PT services  PT Problem List Decreased strength;Decreased range of motion;Decreased activity tolerance;Decreased balance;Decreased mobility;Decreased coordination;Decreased cognition;Decreased knowledge of use of DME;Decreased safety awareness;Decreased knowledge of precautions;Impaired sensation       PT Treatment Interventions Gait training;DME instruction;Stair training;Functional mobility training;Therapeutic activities;Therapeutic exercise;Neuromuscular re-education;Balance training;Cognitive remediation;Patient/family education    PT Goals (Current goals can be  found in the Care Plan section)  Acute Rehab PT Goals Patient Stated Goal: to get "back to normal" PT Goal Formulation: With patient Time For Goal Achievement: 07/02/20 Potential to Achieve Goals: Good    Frequency Min 4X/week   Barriers to discharge        Co-evaluation PT/OT/SLP Co-Evaluation/Treatment: Yes Reason for Co-Treatment: Necessary to address cognition/behavior during functional activity;For patient/therapist safety;To address functional/ADL transfers PT goals addressed during session: Mobility/safety with mobility;Balance         AM-PAC PT "6 Clicks" Mobility  Outcome Measure Help needed turning from your back to your side while in a flat bed without using bedrails?: A Little Help needed moving from lying on your back to sitting on the side of a flat bed without using bedrails?: A Little Help needed moving to and from a bed to a chair (including a wheelchair)?: A Lot Help needed standing up from a chair using your arms (e.g., wheelchair or bedside chair)?: A Lot Help needed to walk in hospital room?: A Lot Help needed climbing 3-5 steps with a railing? : Total 6 Click Score: 13    End of Session Equipment Utilized During Treatment: Gait belt Activity Tolerance: Patient tolerated treatment well Patient left: in chair;with call bell/phone within reach;with chair alarm set Nurse Communication: Mobility status PT Visit Diagnosis: Unsteadiness on feet (R26.81);Other abnormalities of gait and mobility (R26.89);Muscle weakness (generalized) (M62.81);Repeated falls (R29.6);Difficulty in walking, not elsewhere classified (R26.2);Apraxia (R48.2);Other symptoms and signs involving the nervous system (R29.898);Hemiplegia and hemiparesis Hemiplegia - Right/Left: Left Hemiplegia - caused by: Cerebral infarction    Time: 2094-7096 PT Time Calculation (min) (ACUTE ONLY): 23 min   Charges:   PT Evaluation $PT Eval Moderate Complexity: 1 Mod          Raymond Gurney, PT,  DPT Acute Rehabilitation Services  Pager: [phone] Office: [phone]   Jewel Baize 06/18/2020, 10:24 AM

## 2020-06-18 NOTE — Evaluation (Signed)
Occupational Therapy Evaluation Patient Details Name: Frank Luna MRN: [id number] DOB: 1960/10/08 Today's Date: 06/18/2020    History of Present Illness Pt is a 60 y.o. male who presented 4/30 with L-sided weakness and then left AMA then returned. Pt has hx of several prior CVA and TIA events, most recently left ED in April 2021 AMA after having TIA. Pt with poor compliance with medication. During this hospitalization, pt has sustained 2 falls secondary to pt not following directions and safety precautions placed by staff. MRI revealed 1 cm acute infarct in the corona radiata white matter on the R. L elbow and head imaging negative for acute findings following 2nd fall. PMH: CVA, HTN, DM, and thrombosytopenia.   Clinical Impression   This 60 yo male admitted with above presents to acute OT with PLOF of being totally independent with all basic ADLs. Currently he has limited use of his LUE more than LLE but both present with decreased proprioception and he lacks insight into deficits thinking he can stand up and ambulate by himself (given 2 falls since admission where he tried to do so). All of this affecting his safety and independence with basic ADLs. He will continue to benefit from acute OTwith follow up on CIR.    Follow Up Recommendations  CIR;Supervision/Assistance - 24 hour    Equipment Recommendations  Other (comment) (TBD next venue)       Precautions / Restrictions Precautions Precautions: Fall Restrictions Weight Bearing Restrictions: No      Mobility Bed Mobility Overal bed mobility: Needs Assistance Bed Mobility: Supine to Sit     Supine to sit: Min assist     General bed mobility comments: Pt using R bed rail to assist with trunk ascension initiation to sit up L EOB, swinging legs off bed but pt unable to push up from L elbow to complete trunk ascension without minA.    Transfers Overall transfer level: Needs assistance Equipment used: 2 person hand held  assist Transfers: Sit to/from [employer] Sit to Stand: Min assist;+2 physical assistance;+2 safety/equipment Stand pivot transfers: Min assist;+2 physical assistance;+2 safety/equipment       General transfer comment: Cued pt for feet placement, with pt pushing up to stand, needing minAx2 for steadying due to trunk sway. MinAx2 with bil HHA to take side steps to L to transfer to recliner, needing verbal and visual cues for L foot placement and cues to weight shift and bring R to L foot.    Balance Overall balance assessment: Needs assistance Sitting-balance support: No upper extremity supported;Feet supported Sitting balance-Leahy Scale: Fair Sitting balance - Comments: Static sitting EOB no LOB, supervision for safety.   Standing balance support: Bilateral upper extremity supported;During functional activity Standing balance-Leahy Scale: Poor Standing balance comment: Reliant on bil UE support and external assist.                           ADL either performed or assessed with clinical judgement   ADL Overall ADL's : Needs assistance/impaired Eating/Feeding: Set up;Sitting Eating/Feeding Details (indicate cue type and reason): in recliner Grooming: Moderate assistance;Sitting Grooming Details (indicate cue type and reason): in recliner Upper Body Bathing: Moderate assistance;Sitting Upper Body Bathing Details (indicate cue type and reason): in recliner Lower Body Bathing: Moderate assistance Lower Body Bathing Details (indicate cue type and reason): Min A +2 sit<>stand Upper Body Dressing : Maximal assistance;Sitting Upper Body Dressing Details (indicate cue type and reason): in recliner Lower Body  Dressing: Maximal assistance Lower Body Dressing Details (indicate cue type and reason): Min A +2 sit<>stand Toilet Transfer: Minimal assistance;+2 for physical assistance;Stand-pivot Toilet Transfer Details (indicate cue type and reason): bed>recliner on  his right Toileting- Clothing Manipulation and Hygiene: Maximal assistance Toileting - Clothing Manipulation Details (indicate cue type and reason): Min A +2 sit<>stand             Vision Patient Visual Report: No change from baseline Vision Assessment?: Yes Eye Alignment: Within Functional Limits Ocular Range of Motion: Within Functional Limits Alignment/Gaze Preference: Within Defined Limits Tracking/Visual Pursuits: Able to track stimulus in all quads without difficulty Visual Fields: No apparent deficits            Pertinent Vitals/Pain Pain Assessment: Faces Faces Pain Scale: Hurts a little bit Pain Location: L UE with mobility Pain Descriptors / Indicators: Heaviness Pain Intervention(s): Limited activity within patient's tolerance;Monitored during session;Repositioned     Hand Dominance Right   Extremity/Trunk Assessment Upper Extremity Assessment Upper Extremity Assessment: LUE deficits/detail LUE Deficits / Details: Has limited movement at shoulder (can raise arm ~20 degrees) and at elbow (can touch hand to chin but brings head down to meet hand as well); reports it feels the same as his RUE (but has decreased proprioception with testing) LUE Sensation: decreased proprioception LUE Coordination: decreased fine motor;decreased gross motor   Lower Extremity Assessment Lower Extremity Assessment: LLE deficits/detail LLE Deficits / Details: MMT scores of 4+ to 5, but impaired coordination limiting AROM with tasks; delayed proprioception detected in ankle and likely in knee, but not tested at knee LLE Sensation: decreased proprioception LLE Coordination: decreased gross motor;decreased fine motor (dysdiadochokinesia noted and possible dysmetria)   Cervical / Trunk Assessment Cervical / Trunk Assessment: Normal   Communication Communication Communication: No difficulties   Cognition Arousal/Alertness: Awake/alert Behavior During Therapy: WFL for tasks  assessed/performed Overall Cognitive Status: Impaired/Different from baseline Area of Impairment: Orientation;Memory;Following commands;Safety/judgement;Awareness;Problem solving                 Orientation Level: Disoriented to;Time (did not know month or year, states he uses his phone normally for that info)   Memory: Decreased short-term memory Following Commands: Follows one step commands consistently;Follows one step commands with increased time Safety/Judgement: Decreased awareness of safety;Decreased awareness of deficits Awareness: Intellectual Problem Solving: Slow processing;Decreased initiation;Difficulty sequencing;Requires verbal cues;Requires tactile cues General Comments: Pt disoriented to time and forgetting how to perform familiar tasks, like unlock phone or navigate screen to find current date. Pt with slow processing and needing simple multi-modal cues to sequence tasks. STM deficits and poor safety and deficits awareness, forgetting instruction to ask for assistance and not to get up alone with pt believing he is safe enough to transfer back to bed alone, which is not the case. Pt able to demonstrate understanding of pushing nurse call button for help, but unsure whether he will actually remember to do so when he wants anything.              Home Living Family/patient expects to be discharged to:: Private residence Living Arrangements: Spouse/significant other Available Help at Discharge: Family Type of Home: House Home Access: Stairs to enter Secretary/administrator of Steps: 5 Entrance Stairs-Rails: Can reach both Home Layout: One level     Bathroom Shower/Tub: Producer, television/film/video: Handicapped height     Home Equipment: Shower seat          Prior Functioning/Environment Level of Independence: Independent  Comments: Pt was driving. Reports he was on disability.        OT Problem List: Decreased strength;Decreased range of  motion;Impaired balance (sitting and/or standing);Impaired UE functional use;Impaired tone;Impaired sensation;Decreased cognition;Decreased safety awareness      OT Treatment/Interventions: Self-care/ADL training;DME and/or AE instruction;Patient/family education;Balance training;Therapeutic exercise;Therapeutic activities    OT Goals(Current goals can be found in the care plan section) Acute Rehab OT Goals Patient Stated Goal: to get "back to normal" OT Goal Formulation: With patient Time For Goal Achievement: 07/02/20 Potential to Achieve Goals: Good  OT Frequency: Min 2X/week           Co-evaluation PT/OT/SLP Co-Evaluation/Treatment: Yes Reason for Co-Treatment: Necessary to address cognition/behavior during functional activity;For patient/therapist safety;To address functional/ADL transfers PT goals addressed during session: Mobility/safety with mobility;Balance OT goals addressed during session: ADL's and self-care;Strengthening/ROM      AM-PAC OT "6 Clicks" Daily Activity     Outcome Measure Help from another person eating meals?: A Little Help from another person taking care of personal grooming?: A Lot Help from another person toileting, which includes using toliet, bedpan, or urinal?: A Lot Help from another person bathing (including washing, rinsing, drying)?: A Lot Help from another person to put on and taking off regular upper body clothing?: A Lot Help from another person to put on and taking off regular lower body clothing?: Total 6 Click Score: 12   End of Session Equipment Utilized During Treatment: Gait belt  Activity Tolerance: Patient tolerated treatment well Patient left: in chair;with call bell/phone within reach;with chair alarm set  OT Visit Diagnosis: Unsteadiness on feet (R26.81);Other abnormalities of gait and mobility (R26.89);Muscle weakness (generalized) (M62.81);Other symptoms and signs involving cognitive function;History of falling  (Z91.81);Repeated falls (R29.6);Hemiplegia and hemiparesis Hemiplegia - Right/Left: Left Hemiplegia - dominant/non-dominant: Non-Dominant Hemiplegia - caused by: Cerebral infarction                Time: 9211-9417 OT Time Calculation (min): 26 min Charges:  OT General Charges $OT Visit: 1 Visit OT Evaluation $OT Eval Moderate Complexity: 1 Mod  Frank Luna, OTR/L Acute [employer] Pager [phone] Office [phone]     Frank Luna 06/18/2020, 12:06 PM

## 2020-06-18 NOTE — Progress Notes (Signed)
Inpatient Rehab Admissions Coordinator Note:   Per therapy recommendations, pt was screened for CIR candidacy by Megan Salon, MS CCC-SLP. At this time, Pt. Remains observation status. However, Pt. does appear to have functional deficits and medical necessity for CIR admission, so If admitted to inpatient status, please place order for CIR consult.   Please contact me with questions.   Megan Salon, MS, CCC-SLP Rehab Admissions Coordinator  [phone] (celll) [phone] (office)

## 2020-06-18 NOTE — Progress Notes (Signed)
  Echocardiogram 2D Echocardiogram has been performed.  Frank Luna 06/18/2020, 12:44 PM

## 2020-06-18 NOTE — Progress Notes (Signed)
STROKE TEAM PROGRESS NOTE   INTERVAL HISTORY His wife and mom are at the bedside.  Patient sitting in chair, still has left facial droop, left arm leg weakness, PT/OT recommend CIR.  Patient educated on smoking cessation and he is willing to quit.   OBJECTIVE Vitals:   06/17/20 1900 06/17/20 1915 06/17/20 1930 06/18/20 0423  BP: (!) 179/89   (!) 188/96  Pulse: 78 78  86  Resp: (!) 23 (!) 21  18  Temp:   98.4 F (36.9 C) 97.9 F (36.6 C)  TempSrc:    Oral  SpO2: 94% 94%  94%    CBC:  Recent Labs  Lab 06/17/20 0203 06/17/20 0229 06/18/20 0143  WBC 6.6  --  7.4  NEUTROABS 3.7  --   --   HGB 16.7 16.0 16.2  HCT 50.3 47.0 47.7  MCV 90.0  --  87.0  PLT 85*  --  81*    Basic Metabolic Panel:  Recent Labs  Lab 06/17/20 0203 06/17/20 0229 06/18/20 0143  NA 137 140 139  K 3.5 3.7 3.0*  CL 101 100 102  CO2 27  --  28  GLUCOSE 126* 127* 73  BUN 13 13 8   CREATININE 0.93 1.00 0.88  CALCIUM 8.9  --  8.8*    Lipid Panel:     Component Value Date/Time   CHOL 94 06/17/2020 0216   TRIG 115 06/17/2020 0216   HDL 34 (L) 06/17/2020 0216   CHOLHDL 2.8 06/17/2020 0216   VLDL 23 06/17/2020 0216   LDLCALC 37 06/17/2020 0216   HgbA1c:  Lab Results  Component Value Date   HGBA1C 8.3 (H) 06/17/2020   Urine Drug Screen:     Component Value Date/Time   LABOPIA NONE DETECTED 06/17/2020 0815   COCAINSCRNUR NONE DETECTED 06/17/2020 0815   LABBENZ NONE DETECTED 06/17/2020 0815   AMPHETMU NONE DETECTED 06/17/2020 0815   THCU NONE DETECTED 06/17/2020 0815   LABBARB NONE DETECTED 06/17/2020 0815    Alcohol Level     Component Value Date/Time   ETH <10 06/17/2020 0203    IMAGING  DG Elbow 2 Views Left 06/17/2020 IMPRESSION:  Negative.   CT HEAD WO CONTRAST 06/17/2020 IMPRESSION:  1. No acute findings. No intracranial mass, hemorrhage or edema. No skull fracture.  2. Chronic small vessel ischemic changes in the white matter and basal ganglia regions.   CT HEAD WO  CONTRAST 06/17/2020 IMPRESSION:  1. Generalized cerebral atrophy.  2. No acute intracranial abnormality.    MR BRAIN WO CONTRAST 06/17/2020 IMPRESSION:  1 cm acute infarction in the corona radiata white matter on the right. Extensive chronic small-vessel ischemic changes elsewhere throughout brain as outlined above. Bilateral mastoid effusions.   CT ANGIO HEAD NECK W WO CM W PERF 06/17/2020 IMPRESSION:  1. Negative for emergent large vessel occlusion.  2. 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  3. Mild posterior circulation atherosclerosis otherwise, mild bilateral P1 stenoses.  4. Bilateral carotid atherosclerosis but no hemodynamically significant stenosis (mild supraclinoid ICA stenosis).  5. Progressed left middle ear opacification since last year with chronic left mastoid effusion. 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 No discrete nasopharyngeal mass. Recommend ENT follow-up for left eustachian tube dysfunction.  6.  Aortic Atherosclerosis (ICD10-I70.0).    Transthoracic Echocardiogram  IMPRESSIONS  1. Left ventricular ejection fraction, by estimation, is 60  to 65%. The  left ventricle has normal function. The left ventricle has no regional  wall motion abnormalities. There is mild concentric left ventricular  hypertrophy. Left ventricular diastolic  parameters were normal.  2. Right ventricular systolic function is normal. The right ventricular  size is normal. Tricuspid regurgitation signal is inadequate for assessing  PA pressure.  3. The mitral valve is normal in structure. No evidence of mitral valve  regurgitation. No evidence of mitral stenosis.  4. The aortic valve is tricuspid. Aortic valve regurgitation is not  visualized. Mild aortic valve sclerosis is present, with no evidence of   aortic valve stenosis.  5. The inferior vena cava is normal in size with <50% respiratory  variability, suggesting right atrial pressure of 8 mmHg.  6. Agitated saline contrast bubble study was negative, with no evidence  of any interatrial shunt.   ECG - SR rate 64 BPM. (See cardiology reading for complete details)  PHYSICAL EXAM  Temp:  [97.9 F (36.6 C)-98.4 F (36.9 C)] 98.1 F (36.7 C) (05/01 1520) Pulse Rate:  [70-86] 70 (05/01 1520) Resp:  [11-23] 20 (05/01 1520) BP: (157-195)/(79-97) 195/86 (05/01 1520) SpO2:  [91 %-97 %] 94 % (05/01 1520)  General - Well nourished, well developed, in no apparent distress.  Ophthalmologic - fundi not visualized due to noncooperation.  Cardiovascular - Regular rhythm and rate.  Mental Status -  Level of arousal and orientation to time, place, and person were intact. Language including expression, naming, repetition, comprehension was assessed and found intact. Fund of Knowledge was assessed and was intact.  Cranial Nerves II - XII - II - Visual field intact OU. III, IV, VI - Extraocular movements intact. V - Facial sensation intact bilaterally. VII - left facial droop VIII - Hearing & vestibular intact bilaterally. X - Palate elevates symmetrically. XI - Chin turning & shoulder shrug intact bilaterally. XII - Tongue protrusion intact.  Motor Strength - The patient's strength was normal in right upper and lower extremities, however, left upper extremity 3/5 proximal and 1/5 distal, left lower extremity 3+/5 proximal and distal.  Bulk was normal and fasciculations were absent.   Motor Tone - Muscle tone was assessed at the neck and appendages and was normal.  Reflexes - The patient's reflexes were symmetrical in all extremities and he had no pathological reflexes.  Sensory - Light touch, temperature/pinprick were assessed and were symmetrical.    Coordination - The patient had normal movements in the right hand with no ataxia  or dysmetria.  Tremor was absent.  Gait and Station - deferred.   ASSESSMENT/PLAN [name] is a 60 y.o. male with history of DM2, HTN, thrombocytopenia, prior stroke in 2017 with minimal to no residual symptoms, smoker ~1pack per day who presents with left sided weakness. Pt initially signed out AMA but returned and an MRI revealed a 1 cm R corona radiata infarct.  He did not receive IV t-PA due to late presentation (>4.5 hours from time of onset) and minimal deficits.  Stroke: Rt CR infarct likely due to likely due to small vessel disease.   CT head - No acute findings. No intracranial mass, hemorrhage or edema.   MRI head - 1 cm acute infarction in the corona radiata white matter on the right.   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   2D Echo - EF 60 -  65%. No cardiac source of emboli identified.   Sars Corona Virus 2 - negative  LDL - 37  HgbA1c - 8.3  UDS - negative  VTE prophylaxis - lovenox  aspirin 325 mg daily and clopidogrel 75 mg daily prior to admission, now on aspirin 81 mg daily and plavix 75 DAPT for 3 weeks and then ASA alone.  Patient will be counseled to be compliant with his antithrombotic medications  Ongoing aggressive stroke risk factor management  Therapy recommendations:  CIR  Disposition:  Pending  History of stroke  01/2016 admitted for speech difficulty. CT negative. Status post tPA.  CTA head and neck unremarkable.  MRI showed left CR small infarct, old left BG and bilateral thalamus infarcts.  EF 55 to 60%.  Hypertension  Home BP meds: Zestril ; metoprolol  Current BP meds: Zestril ; metoprolol  Stable on the high end . gradually normalize in 3-5 days . Long-term BP goal normotensive  Hyperlipidemia  Home Lipid lowering medication: Lipitor 40 mg daily   LDL 37, goal < 70  Current lipid  lowering medication: Lipitor 40 mg daily   Continue statin at discharge  Diabetes  Home diabetic meds: insulin  Current diabetic meds: insulin  HgbA1c 8.3 , goal < 7.0  CBGs  SSI  Close PCP follow-up for better DM control  Tobacco abuse  Current smoker  Smoking cessation counseling provided  Pt is willing to quit  Other Stroke Risk Factors  Overweight, BMI 28, recommend weight loss, diet and exercise as appropriate   Family hx stroke (great grandfather)   Other Active Problems, Findings, Recommendations and/or Plan  Code status - Full code.  Aortic Atherosclerosis (ICD10-I70.0).   Hypokalemia - potassium - 3.0 - supplemented   Hospital day # 0  Neurology will sign off. Please call with questions. Pt will follow up with stroke clinic NP at [facility] in about 4 weeks. Thanks for the consult.  Marvel Plan, MD PhD Stroke Neurology 06/18/2020 6:06 PM  To contact Stroke Continuity provider, please refer to [email or web address]. After hours, contact General Neurology

## 2020-06-18 NOTE — Evaluation (Signed)
Clinical/Bedside Swallow Evaluation Patient Details  Name: Frank Luna MRN: [id number] Date of Birth: 05-11-1960  Today's Date: 06/18/2020 Time: SLP Start Time (ACUTE ONLY): 1252 SLP Stop Time (ACUTE ONLY): 1305 SLP Time Calculation (min) (ACUTE ONLY): 13.43 min  Past Medical History:  Past Medical History:  Diagnosis Date  . Diabetes (HCC)   . Hypertension   . Stroke (HCC)   . Thrombocytopenia (HCC)    Past Surgical History: History reviewed. No pertinent surgical history. HPI:  Pt is a 60 y.o. male who presented 4/30 with L-sided weakness and then left AMA then returned. Pt has hx of several prior CVA and TIA events, most recently left ED in April 2021 AMA after having TIA. Pt with poor compliance with medication. During this hospitalization, pt has sustained 2 falls secondary to pt not following directions and safety precautions placed by staff. MRI revealed 1 cm acute infarct in the corona radiata white matter on the R. PMH: CVA, HTN, DM, and thrombosytopenia. MoCA 02/06/16: 12/30, with deficits in executive function, (2/5), naming (2/3), attention (1/5), Language (1/3) and delayed recall (0/5).   Assessment / Plan / Recommendation Clinical Impression  Pt was seen for bedside swallow evaluation and he denied a history of dysphagia. He tolerated all solids and liquids without signs or symptoms of oropharyngeal dysphagia. Pt, RN and family denied the pt having any symptoms of dysphagia. A regular texture diet with thin liquids is recommended at this time and further skilled SLP services are not clinically indicated for swallowing. However, SLP will see pt for speech/language/cognition evaluation. SLP Visit Diagnosis: Dysphagia, unspecified (R13.10)    Aspiration Risk  No limitations    Diet Recommendation Regular;Thin liquid   Liquid Administration via: Cup;Straw Medication Administration: Whole meds with liquid Supervision: Patient able to self feed Postural Changes: Seated upright  at 90 degrees    Other  Recommendations Oral Care Recommendations: Oral care BID   Follow up Recommendations Inpatient Rehab      Frequency and Duration min 2x/week          Prognosis Barriers to Reach Goals: Cognitive deficits      Swallow Study   General Date of Onset: 06/17/20 HPI: Pt is a 60 y.o. male who presented 4/30 with L-sided weakness and then left AMA then returned. Pt has hx of several prior CVA and TIA events, most recently left ED in April 2021 AMA after having TIA. Pt with poor compliance with medication. During this hospitalization, pt has sustained 2 falls secondary to pt not following directions and safety precautions placed by staff. MRI revealed 1 cm acute infarct in the corona radiata white matter on the R. PMH: CVA, HTN, DM, and thrombosytopenia. MoCA 02/06/16: 12/30, with deficits in executive function, (2/5), naming (2/3), attention (1/5), Language (1/3) and delayed recall (0/5). Type of Study: Bedside Swallow Evaluation Previous Swallow Assessment: none Diet Prior to this Study: Regular;Thin liquids Temperature Spikes Noted: No Respiratory Status: Room air History of Recent Intubation: No Behavior/Cognition: Alert;Cooperative;Pleasant mood;Confused Oral Cavity Assessment: Within Functional Limits Oral Care Completed by SLP: No Oral Cavity - Dentition: Adequate natural dentition Vision: Functional for self-feeding Self-Feeding Abilities: Able to feed self Patient Positioning: Upright in bed;Postural control adequate for testing Baseline Vocal Quality: Normal Volitional Cough: Strong Volitional Swallow: Able to elicit    Oral/Motor/Sensory Function     Ice Chips Ice chips: Within functional limits Presentation: Spoon   Thin Liquid Thin Liquid: Within functional limits Presentation: Straw    Nectar Thick Nectar Thick Liquid:  Not tested   Honey Thick Honey Thick Liquid: Not tested   Puree Puree: Within functional limits Presentation: Spoon    Solid     Solid: Within functional limits Presentation: Self Fed     Kyanna Mahrt I. Vear Clock, MS, CCC-SLP Acute Rehabilitation Services Office number [phone] Pager [phone]   Scheryl Marten 06/18/2020,1:33 PM

## 2020-06-18 NOTE — Evaluation (Signed)
Speech Language Pathology Evaluation Patient Details Name: Frank Luna MRN: [id number] DOB: 10/18/1960 Today's Date: 06/18/2020 Time: 8185-6314 SLP Time Calculation (min) (ACUTE ONLY): 17 min  Problem List:  Patient Active Problem List   Diagnosis Date Noted  . Stroke (HCC) 06/18/2020  . Acute cerebral infarction (HCC) 06/17/2020  . Hyperlipidemia 02/06/2016  . Cognitive impairment 02/06/2016  . Morbid obesity (HCC) 02/06/2016  . Diabetes (HCC) 02/06/2016  . Tobacco use disorder   . Essential hypertension    Past Medical History:  Past Medical History:  Diagnosis Date  . Diabetes (HCC)   . Hypertension   . Stroke (HCC)   . Thrombocytopenia (HCC)    Past Surgical History: History reviewed. No pertinent surgical history. HPI:  Pt is a 60 y.o. male who presented 4/30 with L-sided weakness and then left AMA then returned. Pt has hx of several prior CVA and TIA events, most recently left ED in April 2021 AMA after having TIA. Pt with poor compliance with medication. During this hospitalization, pt has sustained 2 falls secondary to pt not following directions and safety precautions placed by staff. MRI revealed 1 cm acute infarct in the corona radiata white matter on the R. PMH: CVA, HTN, DM, and thrombosytopenia. MoCA 02/06/16: 12/30, with deficits in executive function, (2/5), naming (2/3), attention (1/5), Language (1/3) and delayed recall (0/5).   Assessment / Plan / Recommendation Clinical Impression  Pt participated in speech/language/cognition evaluation with his wife and step daughter present. Pt's family denied any acute changes in speech/language. Pt's step daughter stated that the pt has some baseline deficits in cognition related to memory, and she indicated that these are worse since admission. Pt denied any baseline or acute deficits. The [facility] Mental Status Examination was completed to evaluate the pt's cognitive-linguistic skills. He achieved a score of  4/30 which is below the normal limits of 27 or more out of 30. He exhibited deficits in the areas of awareness, attention, memory, orientation, problem solving, and executive function. Pt's impairments appear more pronounced than that which was observed during his last evaluation with speech pathology in 2019. Skilled SLP services are clinically indicated at this time to improve cognitive-linguistic function.    SLP Assessment  SLP Recommendation/Assessment: Patient needs continued Speech Lanaguage Pathology Services SLP Visit Diagnosis: Cognitive communication deficit (R41.841)    Follow Up Recommendations  Inpatient Rehab    Frequency and Duration min 2x/week  2 weeks      SLP Evaluation Cognition  Overall Cognitive Status: Impaired/Different from baseline Arousal/Alertness: Awake/alert Orientation Level: Oriented to person;Oriented to place (Time: Jun 19, 2014 after checking phone) Attention: Focused;Sustained Focused Attention: Appears intact Sustained Attention: Impaired Sustained Attention Impairment: Verbal complex Memory: Impaired Memory Impairment: Retrieval deficit;Decreased recall of new information (Immediate: 4/5; delayed: 0/5; with cues: 0/5) Awareness: Impaired Awareness Impairment: Intellectual impairment Problem Solving: Impaired Problem Solving Impairment: Verbal complex;Verbal basic Executive Function: Sequencing;Organizing Sequencing: Impaired Sequencing Impairment: Verbal complex (Clock drawing: 0/4) Organizing: Impaired Organizing Impairment: Verbal complex (Backward digit span: 0/3)       Comprehension  Auditory Comprehension Overall Auditory Comprehension: Appears within functional limits for tasks assessed Yes/No Questions: Within Functional Limits Commands: Within Functional Limits Conversation: Simple    Expression Expression Primary Mode of Expression: Verbal Verbal Expression Overall Verbal Expression: Appears within functional limits for tasks  assessed Initiation: No impairment Level of Generative/Spontaneous Verbalization: Music therapist Expression Dominant Hand: Right   Oral / Motor  Motor Speech Overall Motor Speech: Appears within functional limits for  tasks assessed Respiration: Within functional limits Phonation: Normal Resonance: Within functional limits Articulation: Within functional limitis Intelligibility: Intelligible Motor Planning: Witnin functional limits Motor Speech Errors: Not applicable   Ralph Brouwer I. Vear Clock, MS, CCC-SLP Acute Rehabilitation Services Office number [phone] Pager [phone]                     Scheryl Marten 06/18/2020, 2:08 PM

## 2020-06-18 NOTE — Plan of Care (Signed)

## 2020-06-19 DIAGNOSIS — I639 Cerebral infarction, unspecified: Secondary | ICD-10-CM | POA: Diagnosis not present

## 2020-06-19 LAB — GLUCOSE, CAPILLARY
Glucose-Capillary: 139 mg/dL — ABNORMAL HIGH (ref 70–99)
Glucose-Capillary: 128 mg/dL — ABNORMAL HIGH (ref 70–99)
Glucose-Capillary: 248 mg/dL — ABNORMAL HIGH (ref 70–99)
Glucose-Capillary: 163 mg/dL — ABNORMAL HIGH (ref 70–99)

## 2020-06-19 LAB — COMPREHENSIVE METABOLIC PANEL
ALT: 37 U/L (ref 0–44)
AST: 21 U/L (ref 15–41)
Albumin: 3.3 g/dL — ABNORMAL LOW (ref 3.5–5.0)
Alkaline Phosphatase: 84 U/L (ref 38–126)
Anion gap: 9 (ref 5–15)
BUN: 10 mg/dL (ref 6–20)
CO2: 27 mmol/L (ref 22–32)
Calcium: 8.8 mg/dL — ABNORMAL LOW (ref 8.9–10.3)
Chloride: 101 mmol/L (ref 98–111)
Creatinine, Ser: 0.88 mg/dL (ref 0.61–1.24)
GFR, Estimated: >60 mL/min (ref >60)
Glucose, Bld: 138 mg/dL — ABNORMAL HIGH (ref 70–99)
Potassium: 3.7 mmol/L (ref 3.5–5.1)
Sodium: 137 mmol/L (ref 135–145)
Total Bilirubin: 1.2 mg/dL (ref 0.3–1.2)
Total Protein: 6.2 g/dL — ABNORMAL LOW (ref 6.5–8.1)

## 2020-06-19 LAB — CBC
HCT: 49.7 % (ref 39.0–52.0)
Hemoglobin: 16.9 g/dL (ref 13.0–17.0)
MCH: 29.5 pg (ref 26.0–34.0)
MCHC: 34 g/dL (ref 30.0–36.0)
MCV: 86.9 fL (ref 80.0–100.0)
Platelets: 78 10*3/uL — ABNORMAL LOW (ref 150–400)
RBC: 5.72 MIL/uL (ref 4.22–5.81)
RDW: 12.9 % (ref 11.5–15.5)
WBC: 6.3 10*3/uL (ref 4.0–10.5)
nRBC: 0 % (ref 0.0–0.2)

## 2020-06-19 NOTE — Progress Notes (Addendum)
   Subjective: No acute events overnight. Pt is sitting at chair this morning. Notes left hand continues to feel heavy and numb, concerned this is feeling worse. Discussed that symptoms may improve with medical therapy and rehab. Patient is eager to start CIR. Discussed currently working on getting him insurance approval and placement for CIR. He understand and is agreeable. Pt c/o headache. Denies any chest pain, nausea, vomiting and dyspnea.   Objective:  Vital signs in last 24 hours: Vitals:   06/18/20 1520 06/18/20 2015 06/18/20 2343 06/19/20 0318  BP: (!) 195/86 (!) 166/83 (!) 182/65 (!) 181/92  Pulse: 70 72 64 68  Resp: 20 18 18 18   Temp: 98.1 F (36.7 C) 98.8 F (37.1 C) 98 F (36.7 C) 98.1 F (36.7 C)  TempSrc: Oral Oral Oral Oral  SpO2: 94% 96% 96% 97%   Physical Exam Constitutional: no acute distress Head: atraumatic ENT: external ears normal Eyes: EOMI Cardiovascular: regular rate and rhythm, normal heart sounds Pulmonary: effort normal, lungs clear to ascultation bilaterally Abdominal: flat, nontender, no rebound tenderness, bowel sounds normal Musculoskeletal: Range of motion good Skin: warm and dry Neurological: alert, Cranial nerves intact no aphasia Extraocular movements intact, saccadic eye movements Left upper extremity strength 3 out of 5, weakness demonstrated on small muscles of Left hand. Able to lift Left hand with a lot of effort but cannot keep it up for more than few seconds. Weak hand grip.    Left lower extremity strength 4 out of 5 Sensation intact throughout No dysmetria  Psychiatric: normal mood and affect  Assessment/Plan: Frank Luna is a 60 y.o. male with hx of stroke, TIA, hypertension, diabetes presenting for worsening left-sided weakness, found to have stroke on MRI.  Principal Problem:   Acute cerebral infarction [facility]) Active Problems:   Tobacco use disorder   Essential hypertension   Diabetes (HCC)   Stroke (HCC)  Acute stroke  of the right corona radiata CT head negative for acute bleed. MRI demonstrated 1 cm acute infarction in the corona radiata white matter on the right.  Also extensive chronic small vessel disease elsewhere.  CTA demonstrated moderate stenosis of right vertebral artery, mild posterior circulation atherosclerosis, bilateral carotid atherosclerosis but no hemodynamically significant stenosis.   Neurology following, appreciate further recommendations - Permissive hypertension - PT/OT-recommends CIR, 24 hours supervision assistance - ECHO-LVEF 60 to 65%,  bubble study negative - Telemetry - Continue home atorvastatin for secondary stroke prevention - Continue aspirin 81 mg and Plavix 75 mg for 3 weeks per neurology and then ASA alone.  High Fall risk Pt had 2 falls back to back Saturday evening when he was trying to sit on chair. He hit his head and Left elbow.  X ray left Elbow and CT head done which was normal. -Fall precautions.  Insulin-dependent diabetes diabetes A1c 8.3 at admission. BG today 138.  -On Lantus 12 units nightly  Hypertension Allowing Permissive hypertension.  Thrombocytopenia Chronic, Platelets today 78 decreased from yesterday 81. -Pathologist smear- pending.  Diet:  Thin  IVF:  None VTE:  SCD's Prior to Admission Living Arrangement:  Home Anticipated Discharge Location:  Home Barriers to Discharge: Medical workup Dispo: Anticipated discharge in approximately 1-2 day(s).   Tuesday, MD 06/19/2020, 5:30 AM   On Call pager [phone]

## 2020-06-19 NOTE — Consult Note (Signed)
Physical Medicine and Rehabilitation Consult Reason for Consult: Left-sided weakness and slurred speech Referring Physician: Dr. Oswaldo DoneVincent   HPI: Frank Luna is a 60 y.o. right-handed male with history of diabetes mellitus, hypertension, tobacco use, thrombocytopenia, prior stroke 2017 with minimal residual weakness maintained on aspirin and Plavix.  Per chart review patient lives with spouse.  1 level home 5 steps to entry.  Independent prior to admission.  Presented with acute onset of left-sided weakness and slurred speech.  Initially presented to [facility] after CT negative patient left AMA returning with persistent symptoms.  MRI showed a 1 cm acute infarction in the corona radiata white matter on the right.  Extensive chronic small vessel ischemic changes elsewhere throughout the brain as well as incidental findings of bilateral mastoid effusions.  CT angiogram of the head showed no emergent large vessel occlusion.  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  Admission chemistries unremarkable except glucose 126, alcohol negative, hemoglobin A1c 8.3, urine drug screen negative.  Echocardiogram with ejection fraction of 60 to 65% no wall motion abnormalities.  Presently maintained on aspirin 81 mg daily and Plavix 75 mg daily for CVA prophylaxis x3 weeks then aspirin alone.  Subcutaneous Lovenox for DVT prophylaxis.  Tolerating a regular consistency diet.  Due to patient's left-sided weakness and slurred speech recommendations of physical medicine rehab consult.   Review of Systems  Constitutional: Negative for chills and fever.  HENT: Negative for hearing loss.   Eyes: Negative for blurred vision and double vision.  Respiratory: Negative for cough and shortness of breath.   Cardiovascular: Negative for chest pain, palpitations and leg swelling.  Gastrointestinal: Positive for constipation. Negative for heartburn,  nausea and vomiting.  Genitourinary: Negative for dysuria, flank pain and hematuria.  Skin: Negative for rash.  Neurological: Positive for speech change and weakness.  All other systems reviewed and are negative.  Past Medical History:  Diagnosis Date  . Diabetes (HCC)   . Hypertension   . Stroke (HCC)   . Thrombocytopenia (HCC)    History reviewed. No pertinent surgical history. Family History  Problem Relation Age of Onset  . Stroke Paternal Great-grandfather    Social History:  reports that he has been smoking cigarettes. He has been smoking about 1.00 pack per day. He has never used smokeless tobacco. He reports that he does not drink alcohol and does not use drugs. Allergies:  Allergies  Allergen Reactions  . Penicillins     Unknown reaction   Medications Prior to Admission  Medication Sig Dispense Refill  . acetaminophen (TYLENOL) 500 MG tablet Take 1,000 mg by mouth every 6 (six) hours as needed for headache (pain).    Marland Kitchen. aspirin EC 325 MG EC tablet Take 1 tablet (325 mg total) by mouth daily. 90 tablet 3  . atorvastatin (LIPITOR) 40 MG tablet Take 1 tablet (40 mg total) by mouth daily at 6 PM. 90 tablet 3  . clopidogrel (PLAVIX) 75 MG tablet Take 1 tablet (75 mg total) by mouth daily. 30 tablet 1  . dapagliflozin propanediol (FARXIGA) 10 MG TABS tablet Take 10 mg by mouth daily.    Marland Kitchen. glipiZIDE (GLUCOTROL XL) 10 MG 24 hr tablet Take 10 mg by mouth 2 (two) times daily.    Marland Kitchen. LANTUS SOLOSTAR 100 UNIT/ML Solostar Pen Inject 15 Units into the skin in the morning and at bedtime.    Marland Kitchen. lisinopril (ZESTRIL) 40 MG tablet  Take 40 mg by mouth daily.    . metoprolol succinate (TOPROL-XL) 50 MG 24 hr tablet Take 1 tablet (50 mg total) by mouth daily. Take with or immediately following a meal. (Patient taking differently: Take 75 mg by mouth daily. Take with or immediately following a meal.) 90 tablet 3    Home: Home Living Family/patient expects to be discharged to:: Private  residence Living Arrangements: Spouse/significant other Available Help at Discharge: Family Type of Home: House Home Access: Stairs to enter Secretary/administrator of Steps: 5 Entrance Stairs-Rails: Can reach both Home Layout: One level Bathroom Shower/Tub: Health visitor: Handicapped height Home Equipment: Information systems manager  Lives With: Spouse  Functional History: Prior Function Level of Independence: Independent Comments: Pt was driving. Reports he was on disability. Functional Status:  Mobility: Bed Mobility Overal bed mobility: Needs Assistance Bed Mobility: Supine to Sit Supine to sit: Min assist General bed mobility comments: Pt using R bed rail to assist with trunk ascension initiation to sit up L EOB, swinging legs off bed but pt unable to push up from L elbow to complete trunk ascension without minA. Transfers Overall transfer level: Needs assistance Equipment used: 2 person hand held assist Transfers: Sit to/from [employer] Sit to Stand: Min assist,+2 physical assistance,+2 safety/equipment Stand pivot transfers: Min assist,+2 physical assistance,+2 safety/equipment General transfer comment: Cued pt for feet placement, with pt pushing up to stand, needing minAx2 for steadying due to trunk sway. MinAx2 with bil HHA to take side steps to L to transfer to recliner, needing verbal and visual cues for L foot placement and cues to weight shift and bring R to L foot. Ambulation/Gait Ambulation/Gait assistance: Min assist,+2 physical assistance,+2 safety/equipment Gait Distance (Feet): 3 Feet Assistive device: 2 person hand held assist Gait Pattern/deviations: Decreased step length - left,Decreased stance time - left,Decreased stride length,Narrow base of support General Gait Details: Several side steps to L with bil HHA minAx2, cuing pt via visual and verbal cues for L foot placement, intermittently bringing R foot too proximal resulting in very narrow  BOS and increased trunk sway. Gait velocity: reduced Gait velocity interpretation: <1.31 ft/sec, indicative of household ambulator    ADL: ADL Overall ADL's : Needs assistance/impaired Eating/Feeding: Set up,Sitting Eating/Feeding Details (indicate cue type and reason): in recliner Grooming: Moderate assistance,Sitting Grooming Details (indicate cue type and reason): in recliner Upper Body Bathing: Moderate assistance,Sitting Upper Body Bathing Details (indicate cue type and reason): in recliner Lower Body Bathing: Moderate assistance Lower Body Bathing Details (indicate cue type and reason): Min A +2 sit<>stand Upper Body Dressing : Maximal assistance,Sitting Upper Body Dressing Details (indicate cue type and reason): in recliner Lower Body Dressing: Maximal assistance Lower Body Dressing Details (indicate cue type and reason): Min A +2 sit<>stand Toilet Transfer: Minimal assistance,+2 for physical assistance,Stand-pivot Toilet Transfer Details (indicate cue type and reason): bed>recliner on his right Toileting- Clothing Manipulation and Hygiene: Maximal assistance Toileting - Clothing Manipulation Details (indicate cue type and reason): Min A +2 sit<>stand  Cognition: Cognition Overall Cognitive Status: Impaired/Different from baseline Arousal/Alertness: Awake/alert Orientation Level: Oriented to person,Oriented to place,Oriented to situation Attention: Focused,Sustained Focused Attention: Appears intact Sustained Attention: Impaired Sustained Attention Impairment: Verbal complex Memory: Impaired Memory Impairment: Retrieval deficit,Decreased recall of new information (Immediate: 4/5; delayed: 0/5; with cues: 0/5) Awareness: Impaired Awareness Impairment: Intellectual impairment Problem Solving: Impaired Problem Solving Impairment: Verbal complex,Verbal basic Executive Function: Sequencing,Organizing Sequencing: Impaired Sequencing Impairment: Verbal complex (Clock  drawing: 0/4) Organizing: Impaired Organizing Impairment: Verbal complex (  Backward digit span: 0/3) Cognition Arousal/Alertness: Awake/alert Behavior During Therapy: WFL for tasks assessed/performed Overall Cognitive Status: Impaired/Different from baseline Area of Impairment: Orientation,Memory,Following commands,Safety/judgement,Awareness,Problem solving Orientation Level: Disoriented to,Time (did not know month or year, states he uses his phone normally for that info) Memory: Decreased short-term memory Following Commands: Follows one step commands consistently,Follows one step commands with increased time Safety/Judgement: Decreased awareness of safety,Decreased awareness of deficits Awareness: Intellectual Problem Solving: Slow processing,Decreased initiation,Difficulty sequencing,Requires verbal cues,Requires tactile cues General Comments: Pt disoriented to time and forgetting how to perform familiar tasks, like unlock phone or navigate screen to find current date. Pt with slow processing and needing simple multi-modal cues to sequence tasks. STM deficits and poor safety and deficits awareness, forgetting instruction to ask for assistance and not to get up alone with pt believing he is safe enough to transfer back to bed alone, which is not the case. Pt able to demonstrate understanding of pushing nurse call button for help, but unsure whether he will actually remember to do so when he wants anything.  Blood pressure (!) 181/92, pulse 68, temperature 98.1 F (36.7 C), temperature source Oral, resp. rate 18, SpO2 97 %. Physical Exam Vitals and nursing note reviewed.  Constitutional:      Appearance: He is obese.  HENT:     Head: Normocephalic and atraumatic.  Eyes:     Extraocular Movements: Extraocular movements intact.     Conjunctiva/sclera: Conjunctivae normal.     Pupils: Pupils are equal, round, and reactive to light.  Cardiovascular:     Rate and Rhythm: Normal rate and  regular rhythm.     Pulses: Normal pulses.     Heart sounds: Normal heart sounds. No murmur heard.   Pulmonary:     Effort: Pulmonary effort is normal. No respiratory distress.     Breath sounds: Normal breath sounds. No stridor.  Abdominal:     General: Abdomen is flat. Bowel sounds are normal. There is no distension.     Palpations: Abdomen is soft.  Musculoskeletal:        General: No swelling or tenderness.  Skin:    General: Skin is warm and dry.  Neurological:     Mental Status: He is oriented to person, place, and time.     Cranial Nerves: No cranial nerve deficit or dysarthria.     Sensory: No sensory deficit.     Motor: No tremor or abnormal muscle tone.     Coordination: Coordination abnormal.     Gait: Gait abnormal.     Comments: Patient is alert in no acute distress.  Makes eye contact with examiner.  Oriented to person place and time.  Follows simple commands.  Fair awareness of deficits.  Motor strength is 5/5 in the right deltoid, bicep, tricep, grip, hip flexor, knee extensor, ankle dorsiflexor and plantar flexor To minus left deltoid and biceps 0 at the tricep trace finger flexors and extensors Left lower extremity 3 - hip flexor and 4 - knee extensor 3 - ankle dorsiflexor Sensation intact to light touch bilateral upper and lower limbs  Psychiatric:        Mood and Affect: Mood normal.        Behavior: Behavior normal.     Results for orders placed or performed during the hospital encounter of 06/17/20 (from the past 24 hour(s))  Glucose, capillary     Status: Abnormal   Collection Time: 06/18/20  6:15 AM  Result Value Ref Range   Glucose-Capillary 103 (  H) 70 - 99 mg/dL  Glucose, capillary     Status: Abnormal   Collection Time: 06/18/20  4:32 PM  Result Value Ref Range   Glucose-Capillary 129 (H) 70 - 99 mg/dL  Glucose, capillary     Status: Abnormal   Collection Time: 06/18/20  9:42 PM  Result Value Ref Range   Glucose-Capillary 174 (H) 70 - 99 mg/dL    Comment 1 Notify RN    Comment 2 Document in Chart   CBC     Status: Abnormal   Collection Time: 06/19/20  3:21 AM  Result Value Ref Range   WBC 6.3 4.0 - 10.5 K/uL   RBC 5.72 4.22 - 5.81 MIL/uL   Hemoglobin 16.9 13.0 - 17.0 g/dL   HCT 11.9 14.7 - 82.9 %   MCV 86.9 80.0 - 100.0 fL   MCH 29.5 26.0 - 34.0 pg   MCHC 34.0 30.0 - 36.0 g/dL   RDW 56.2 13.0 - 86.5 %   Platelets 78 (L) 150 - 400 K/uL   nRBC 0.0 0.0 - 0.2 %  Comprehensive metabolic panel     Status: Abnormal   Collection Time: 06/19/20  3:21 AM  Result Value Ref Range   Sodium 137 135 - 145 mmol/L   Potassium 3.7 3.5 - 5.1 mmol/L   Chloride 101 98 - 111 mmol/L   CO2 27 22 - 32 mmol/L   Glucose, Bld 138 (H) 70 - 99 mg/dL   BUN 10 6 - 20 mg/dL   Creatinine, Ser 7.84 0.61 - 1.24 mg/dL   Calcium 8.8 (L) 8.9 - 10.3 mg/dL   Total Protein 6.2 (L) 6.5 - 8.1 g/dL   Albumin 3.3 (L) 3.5 - 5.0 g/dL   AST 21 15 - 41 U/L   ALT 37 0 - 44 U/L   Alkaline Phosphatase 84 38 - 126 U/L   Total Bilirubin 1.2 0.3 - 1.2 mg/dL   GFR, Estimated >69 >62 mL/min   Anion gap 9 5 - 15   DG Elbow 2 Views Left  Result Date: 06/17/2020 CLINICAL DATA:  Fall, elbow stiffness. EXAM: LEFT ELBOW - 2 VIEW COMPARISON:  None. FINDINGS: There is no evidence of fracture, dislocation, or joint effusion. There is no evidence of significant arthropathy or other focal bone abnormality. Soft tissues are unremarkable. IMPRESSION: Negative. Electronically Signed   By: Bary Richard M.D.   On: 06/17/2020 19:52   CT HEAD WO CONTRAST  Result Date: 06/17/2020 CLINICAL DATA:  Follow-up stroke.  Fall from bed with head trauma. EXAM: CT HEAD WITHOUT CONTRAST TECHNIQUE: Contiguous axial images were obtained from the base of the skull through the vertex without intravenous contrast. COMPARISON:  Head CT dated 06/17/2020.  Brain MRI dated 06/17/2020. FINDINGS: Brain: Generalized parenchymal volume loss with commensurate dilatation of the ventricles and sulci. No evidence of  hydrocephalus. Chronic small vessel ischemic changes are seen within the bilateral periventricular and subcortical white matter regions. Small old lacunar infarcts are seen within the bilateral basal ganglia regions. No mass, hemorrhage, edema or other evidence of acute parenchymal abnormality. No extra-axial hemorrhage. Vascular: Chronic calcified atherosclerotic changes of the large vessels at the skull base. No unexpected hyperdense vessel. Skull: Normal. Negative for fracture or focal lesion. Sinuses/Orbits: No acute finding. Other: None. IMPRESSION: 1. No acute findings. No intracranial mass, hemorrhage or edema. No skull fracture. 2. Chronic small vessel ischemic changes in the white matter and basal ganglia regions. Electronically Signed   By: Anne Ng.D.  On: 06/17/2020 19:56   MR BRAIN WO CONTRAST  Result Date: 06/17/2020 CLINICAL DATA:  Left-sided arm and leg weakness. EXAM: MRI HEAD WITHOUT CONTRAST TECHNIQUE: Multiplanar, multiecho pulse sequences of the brain and surrounding structures were obtained without intravenous contrast. COMPARISON:  Negative acute CT evaluations for 202021 and earlier today. FINDINGS: Brain: Study suffers from considerable motion degradation. There is a 1 cm acute infarction within the white matter of the corona radiography on the right. No other acute infarction. Extensive chronic small-vessel ischemic changes affect the pons. No focal cerebellar infarction. Old small vessel infarctions are present affecting the thalami, basal ganglia and hemispheric white matter. No large vessel territory infarction. No mass, hemorrhage, hydrocephalus or extra-axial collection. Vascular: Major vessels at the base of the brain show flow. Skull and upper cervical spine: Negative Sinuses/Orbits: Clear/normal Other: Bilateral mastoid effusions. IMPRESSION: 1 cm acute infarction in the corona radiata white matter on the right. Extensive chronic small-vessel ischemic changes elsewhere  throughout brain as outlined above. Bilateral mastoid effusions. Electronically Signed   By: Paulina Fusi M.D.   On: 06/17/2020 16:12   ECHOCARDIOGRAM COMPLETE BUBBLE STUDY  Result Date: 06/18/2020    ECHOCARDIOGRAM REPORT   Patient Name:   ZAKHAI MEISINGER Date of Exam: 06/18/2020 Medical Rec #:  [id number]    Height:       69.0 in Accession #:    [id number]   Weight:       189.6 lb Date of Birth:  July 28, 1960    BSA:          2.020 m Patient Age:    59 years     BP:           167/79 mmHg Patient Gender: M            HR:           77 bpm. Exam Location:  Inpatient Procedure: 2D Echo, Cardiac Doppler, Color Doppler and Saline Contrast Bubble            Study Indications:    CVA  History:        Patient has prior history of Echocardiogram examinations, most                 recent 02/05/2016. Stroke; Risk Factors:Hypertension, Diabetes,                 Current Smoker and Obesity.  Sonographer:    Lavenia Atlas Referring Phys: [id number] [facility]  Sonographer Comments: Patient is morbidly obese and suboptimal apical window. Image acquisition challenging due to patient body habitus. IMPRESSIONS  1. Left ventricular ejection fraction, by estimation, is 60 to 65%. The left ventricle has normal function. The left ventricle has no regional wall motion abnormalities. There is mild concentric left ventricular hypertrophy. Left ventricular diastolic parameters were normal.  2. Right ventricular systolic function is normal. The right ventricular size is normal. Tricuspid regurgitation signal is inadequate for assessing PA pressure.  3. The mitral valve is normal in structure. No evidence of mitral valve regurgitation. No evidence of mitral stenosis.  4. The aortic valve is tricuspid. Aortic valve regurgitation is not visualized. Mild aortic valve sclerosis is present, with no evidence of aortic valve stenosis.  5. The inferior vena cava is normal in size with <50% respiratory variability, suggesting right atrial pressure  of 8 mmHg.  6. Agitated saline contrast bubble study was negative, with no evidence of any interatrial shunt. FINDINGS  Left Ventricle: Left ventricular ejection fraction, by estimation,  is 60 to 65%. The left ventricle has normal function. The left ventricle has no regional wall motion abnormalities. The left ventricular internal cavity size was normal in size. There is  mild concentric left ventricular hypertrophy. Left ventricular diastolic parameters were normal. Normal left ventricular filling pressure. Right Ventricle: The right ventricular size is normal. No increase in right ventricular wall thickness. Right ventricular systolic function is normal. Tricuspid regurgitation signal is inadequate for assessing PA pressure. Left Atrium: Left atrial size was normal in size. Right Atrium: Right atrial size was normal in size. Pericardium: There is no evidence of pericardial effusion. Mitral Valve: The mitral valve is normal in structure. No evidence of mitral valve regurgitation. No evidence of mitral valve stenosis. Tricuspid Valve: The tricuspid valve is normal in structure. Tricuspid valve regurgitation is not demonstrated. No evidence of tricuspid stenosis. Aortic Valve: The aortic valve is tricuspid. Aortic valve regurgitation is not visualized. Mild aortic valve sclerosis is present, with no evidence of aortic valve stenosis. Pulmonic Valve: The pulmonic valve was normal in structure. Pulmonic valve regurgitation is not visualized. No evidence of pulmonic stenosis. Aorta: The aortic root is normal in size and structure. Venous: The inferior vena cava is normal in size with less than 50% respiratory variability, suggesting right atrial pressure of 8 mmHg. IAS/Shunts: No atrial level shunt detected by color flow Doppler. Agitated saline contrast was given intravenously to evaluate for intracardiac shunting. Agitated saline contrast bubble study was negative, with no evidence of any interatrial shunt.  LEFT  VENTRICLE PLAX 2D LVIDd:         4.90 cm  Diastology LVIDs:         3.00 cm  LV e' medial:    7.62 cm/s LV PW:         1.40 cm  LV E/e' medial:  9.6 LV IVS:        1.40 cm  LV e' lateral:   8.59 cm/s LVOT diam:     2.10 cm  LV E/e' lateral: 8.5 LV SV:         69 LV SV Index:   34 LVOT Area:     3.46 cm  RIGHT VENTRICLE RV Basal diam:  3.50 cm RV S prime:     12.30 cm/s TAPSE (M-mode): 2.4 cm LEFT ATRIUM             Index       RIGHT ATRIUM           Index LA diam:        4.00 cm 1.98 cm/m  RA Area:     12.80 cm LA Vol (A2C):   48.6 ml 24.06 ml/m RA Volume:   28.70 ml  14.21 ml/m LA Vol (A4C):   34.8 ml 17.23 ml/m LA Biplane Vol: 41.4 ml 20.50 ml/m  AORTIC VALVE LVOT Vmax:   108.00 cm/s LVOT Vmean:  70.200 cm/s LVOT VTI:    0.199 m  AORTA Ao Root diam: 3.30 cm MITRAL VALVE MV Area (PHT): 3.99 cm    SHUNTS MV Decel Time: 190 msec    Systemic VTI:  0.20 m MV E velocity: 73.30 cm/s  Systemic Diam: 2.10 cm MV A velocity: 61.70 cm/s MV E/A ratio:  1.19 Armanda Magic MD Electronically signed by Armanda Magic MD Signature Date/Time: 06/18/2020/1:05:44 PM    Final    CT ANGIO HEAD NECK W WO CM W PERF  Result Date: 06/17/2020 CLINICAL DATA:  60 year old male with left side numbness and weakness  upon waking. 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 EXAM: CT ANGIOGRAPHY HEAD AND NECK TECHNIQUE: Multidetector CT imaging of the head and neck was performed using the standard protocol during bolus administration of intravenous contrast. Multiplanar CT image reconstructions and MIPs were obtained to evaluate the vascular anatomy. Carotid stenosis measurements (when applicable) are obtained utilizing NASCET criteria, using the distal internal carotid diameter as the denominator. CONTRAST:  OMNIPAQUE IOHEXOL 350 MG/ML SOLN COMPARISON:  CTA head and neck and CT brain perfusion 06/08/2019. CTA head and neck 02/04/2016. FINDINGS: CTA NECK Skeleton: Carious  dentition. Progressive left middle ear opacification since last year. Chronic mastoid effusions greater on the left. No acute osseous abnormality identified. Upper chest: Stable upper lungs, mild pulmonary scarring. No superior mediastinal lymphadenopathy. Other neck: Partially effaced pharynx similar to last year. Chronic adenoid soft tissue enlargement has not significantly changed. No discrete nasopharynx mass or hyperenhancement. Negative parapharyngeal and retropharyngeal spaces. No cervical lymphadenopathy identified. Aortic arch: Calcified aortic atherosclerosis. 3 vessel arch configuration. Right carotid system: Mild right CCA plaque without stenosis. Mild to moderate soft and calcified plaque at the right carotid bifurcation through ICA bulb without stenosis. Left carotid system: Minimal left CCA soft plaque without stenosis. Mild mostly calcified plaque at the left carotid bifurcation and ICA origin without stenosis. Vertebral arteries: Proximal subclavian arteries are patent with mild plaque and no significant stenosis. Both vertebral arteries are diminutive, more so the right. There is chronic atherosclerosis at the right vertebral artery origin with at least moderate stenosis there, stable. Left vertebral artery origin is within normal limits. Both diminutive vertebral arteries remain patent to the skull base without additional stenosis. CTA HEAD Posterior circulation: Diminutive right vertebral artery terminates in PICA. Left PICA origin is patent. Diminutive left V4 segment continues to the vertebrobasilar junction. 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 This appearance unchanged since 2017 and likely congenital. Both posterior communicating arteries are present, the left is larger. There is mild bilateral P1 irregularity and stenosis. PCA  branches are within normal limits. Anterior circulation: Both ICA siphons are patent. On the left mild to moderate calcified atherosclerosis results in mild supraclinoid stenosis. On the right similar mild to moderate calcified plaque and mild supraclinoid stenosis. Right side persistent trigeminal artery also with mild calcified plaque. Normal ophthalmic and posterior communicating artery origins. Patent carotid termini. Patent MCA and ACA origins. Mild new left ACA A1 segment stenosis on series 14, image 22. Diminutive anterior communicating artery. Mildly fenestrated proximal left A2 (normal variant). Bilateral ACA branches are stable and within normal limits. Left MCA M1 segment and bifurcation are patent without stenosis. Right MCA M1 segment and bifurcation are patent without stenosis. Bilateral MCA branches are stable and within normal limits. Venous sinuses: Early contrast timing.  Grossly patent. 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 Right vertebral artery terminates in PICA. Review of the MIP images confirms the above findings IMPRESSION: 1. Negative for emergent large vessel occlusion. 2. 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 3. Mild posterior circulation atherosclerosis otherwise, mild bilateral P1 stenoses. 4. Bilateral carotid atherosclerosis but no hemodynamically significant stenosis (mild supraclinoid ICA stenosis). 5. Progressed left middle ear opacification since last year with chronic left  mastoid effusion. 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 No discrete nasopharyngeal mass. Recommend ENT follow-up for left eustachian tube dysfunction. 6.  Aortic Atherosclerosis (ICD10-I70.0). Electronically Signed   By: Odessa Fleming M.D.   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 1. Does the need for close, 24 hr/day medical supervision in concert with the patient's rehab needs make it unreasonable for this patient to be served in a less intensive setting? Yes 2. Co-Morbidities requiring supervision/potential complications: Hypertension, diabetes, history of prior CVA 3. Due to bladder management, bowel management, safety, skin/wound care, disease management, medication administration, pain management and patient education, does the patient require 24 hr/day rehab nursing? Yes 4. Does the patient require coordinated care of a physician, rehab nurse, therapy disciplines of PT, OT, speech to address physical and functional deficits in the context of the above medical diagnosis(es)? Yes Addressing deficits in the following areas: balance, endurance, locomotion, strength, transferring, bowel/bladder control, bathing, dressing and psychosocial support 5. Can the patient actively participate in an intensive therapy program of at least 3 hrs of therapy per day at least 5 days per week? Yes 6. The potential for patient to make measurable gains while on inpatient rehab is good 7. Anticipated functional outcomes upon discharge from inpatient rehab are modified independent  with PT, supervision and min assist with OT, modified independent with SLP. 8. Estimated rehab length of stay to reach the above functional goals is: 17 to 20 days 9. Anticipated discharge destination: Home 10. Overall Rehab/Functional Prognosis: good  RECOMMENDATIONS: This patient's condition is appropriate for continued rehabilitative care in the following setting: CIR Patient has agreed to participate in recommended program. Yes Note that insurance prior authorization may be required for reimbursement for recommended care.  Comment:    Charlton Amor, PA-C 06/19/2020  "I have personally performed a face  to face diagnostic evaluation of this patient.  Additionally, I have reviewed and concur with the physician assistant's documentation above." Erick Colace M.D. [facility] Fellow Am [employer] of [employer] and Rehab Diplomate Am Board of Electrodiagnostic Med Fellow Am Board of Interventional Pain

## 2020-06-19 NOTE — Progress Notes (Signed)
Physical Therapy Treatment Patient Details Name: Frank Luna MRN: [id number] DOB: Jul 12, 1960 Today's Date: 06/19/2020    History of Present Illness Pt is a 60 y.o. male who presented 4/30 with L-sided weakness and then left AMA then returned. Pt has hx of several prior CVA and TIA events, most recently left ED in April 2021 AMA after having TIA. Pt with poor compliance with medication. During this hospitalization, pt has sustained 2 falls secondary to pt not following directions and safety precautions placed by staff. MRI revealed 1 cm acute infarct in the corona radiata white matter on the R. L elbow and head imaging negative for acute findings following 2nd fall. PMH: CVA, HTN, DM, and thrombosytopenia.    PT Comments    Pt and family motivated for pt to participate in gait training today. Pt ambulatory for x2 short hallway distances with significant physical assist needed for LLE progression and blocking during swing and stance phase of gait respectively, steadying, and supporting pt bodyweight. Pt requires significant cuing to wait for PT assist during gait training, with pt benefiting from step-by-step sequencing cues, as pt demonstrates impulsivity with mobility. PT continuing to recommend CIR post-acutely, remains an excellent candidate.    Follow Up Recommendations  CIR;Supervision/Assistance - 24 hour     Equipment Recommendations  Rolling walker with 5" wheels;3in1 (PT)    Recommendations for Other Services Rehab consult     Precautions / Restrictions Precautions Precautions: Fall Precaution Comments: L-sided paresis and incoordination Restrictions Weight Bearing Restrictions: No    Mobility  Bed Mobility Overal bed mobility: Needs Assistance             General bed mobility comments: up in chair upon PT arrival to room.    Transfers Overall transfer level: Needs assistance Equipment used: Rolling walker (2 wheeled);1 person hand held assist Transfers: Sit  to/from Stand Sit to Stand: Min assist;+2 safety/equipment         General transfer comment: Min assist for power up, steadying, and correct foot placement when rising especially with LLE. First stand attempt with RW, difficult secondary to L hand weakness and incoordination.  Ambulation/Gait Ambulation/Gait assistance: Mod assist;+2 safety/equipment Gait Distance (Feet): 25 Feet (x2, with seated rest break) Assistive device: 1 person hand held assist (R handrail in hallway) Gait Pattern/deviations: Decreased step length - left;Decreased stance time - left;Decreased stride length;Narrow base of support;Step-through pattern;Drifts right/left;Trunk flexed Gait velocity: reduced   General Gait Details: Mod assist for LLE lift and knee flexion during swing phase and L knee blocking during stance phase, steadying assist, truncal control via gait belt use, cues for upright posture and use of railing in hallway for improved stability.   Stairs             Wheelchair Mobility    Modified Rankin (Stroke Patients Only) Modified Rankin (Stroke Patients Only) Pre-Morbid Rankin Score: No symptoms Modified Rankin: Moderately severe disability     Balance Overall balance assessment: Needs assistance Sitting-balance support: No upper extremity supported;Feet supported Sitting balance-Leahy Scale: Fair     Standing balance support: Bilateral upper extremity supported;During functional activity Standing balance-Leahy Scale: Poor Standing balance comment: reliant on external assist                            Cognition Arousal/Alertness: Awake/alert Behavior During Therapy: WFL for tasks assessed/performed;Impulsive Overall Cognitive Status: Impaired/Different from baseline Area of Impairment: Memory;Following commands;Safety/judgement;Awareness;Problem solving  Memory: Decreased short-term memory Following Commands: Follows one step commands  consistently;Follows one step commands with increased time Safety/Judgement: Decreased awareness of safety;Decreased awareness of deficits Awareness: Intellectual Problem Solving: Decreased initiation;Difficulty sequencing;Requires verbal cues;Requires tactile cues General Comments: Pt demonstrates impulsivity intermittently during session, requiring cues to slow down and wait for PT assist. Pt lacks some insight into deficits, and requires step-by-step cuing for higher level tasks (gait). Pt oriented to hospital, recent falls, family members in the room.      Exercises      General Comments General comments (skin integrity, edema, etc.): vss      Pertinent Vitals/Pain Pain Assessment: Faces Faces Pain Scale: No hurt Pain Intervention(s): Monitored during session    Home Living                      Prior Function            PT Goals (current goals can now be found in the care plan section) Acute Rehab PT Goals Patient Stated Goal: to get "back to normal" PT Goal Formulation: With patient Time For Goal Achievement: 07/02/20 Potential to Achieve Goals: Good Progress towards PT goals: Progressing toward goals    Frequency    Min 4X/week      PT Plan Current plan remains appropriate    Co-evaluation              AM-PAC PT "6 Clicks" Mobility   Outcome Measure  Help needed turning from your back to your side while in a flat bed without using bedrails?: A Little Help needed moving from lying on your back to sitting on the side of a flat bed without using bedrails?: A Little Help needed moving to and from a bed to a chair (including a wheelchair)?: A Little Help needed standing up from a chair using your arms (e.g., wheelchair or bedside chair)?: A Little Help needed to walk in hospital room?: A Lot Help needed climbing 3-5 steps with a railing? : Total 6 Click Score: 15    End of Session Equipment Utilized During Treatment: Gait belt Activity  Tolerance: Patient tolerated treatment well Patient left: in chair;with call bell/phone within reach;with chair alarm set Nurse Communication: Mobility status PT Visit Diagnosis: Unsteadiness on feet (R26.81);Other abnormalities of gait and mobility (R26.89);Muscle weakness (generalized) (M62.81);Repeated falls (R29.6);Difficulty in walking, not elsewhere classified (R26.2);Apraxia (R48.2);Other symptoms and signs involving the nervous system (R29.898);Hemiplegia and hemiparesis Hemiplegia - Right/Left: Left Hemiplegia - caused by: Cerebral infarction     Time: 9675-9163 PT Time Calculation (min) (ACUTE ONLY): 20 min  Charges:  $Gait Training: 8-22 mins                     Marye Round, PT DPT Acute Rehabilitation Services Pager [phone]  Office [phone]   Harrington Jobe E Christain [place] 06/19/2020, 2:06 PM

## 2020-06-19 NOTE — Progress Notes (Signed)
Inpatient Rehab Admissions Coordinator:   Met with patient at bedside to review CIR consult, goals, and expectations of rehab stay.  Pt very motivated to improve his mobility.  We discussed that he would likely need some assist for ADLs, but be fairly independent (+/- AD) with mobility.  Pt's wife is home with him during the day and can assist with ADLs.  I explained insurance authorization process and pt is agreeable, so I will start that today.  We will follow for potential admission pending approval and bed availability.   Shann Medal, PT, DPT Admissions Coordinator [phone] 06/19/20  2:36 PM

## 2020-06-19 NOTE — Plan of Care (Signed)
  Problem: Education: Goal: Knowledge of General Education information will improve Description: Including pain rating scale, medication(s)/side effects and non-pharmacologic comfort measures Outcome: Progressing   Problem: Health Behavior/Discharge Planning: Goal: Ability to manage health-related needs will improve Outcome: Progressing   Problem: Clinical Measurements: Goal: Ability to maintain clinical measurements within normal limits will improve Outcome: Progressing Goal: Will remain free from infection Outcome: Progressing Goal: Diagnostic test results will improve Outcome: Progressing Goal: Respiratory complications will improve Outcome: Progressing Goal: Cardiovascular complication will be avoided Outcome: Progressing   Problem: Activity: Goal: Risk for activity intolerance will decrease Outcome: Progressing   Problem: Nutrition: Goal: Adequate nutrition will be maintained Outcome: Progressing   Problem: Coping: Goal: Level of anxiety will decrease Outcome: Progressing   Problem: Elimination: Goal: Will not experience complications related to bowel motility Outcome: Progressing Goal: Will not experience complications related to urinary retention Outcome: Progressing   Problem: Pain Managment: Goal: General experience of comfort will improve Outcome: Progressing   Problem: Safety: Goal: Ability to remain free from injury will improve Outcome: Progressing   Problem: Skin Integrity: Goal: Risk for impaired skin integrity will decrease Outcome: Progressing   Problem: Education: Goal: Knowledge of disease or condition will improve Outcome: Progressing Goal: Knowledge of secondary prevention will improve Outcome: Progressing Goal: Knowledge of patient specific risk factors addressed and post discharge goals established will improve Outcome: Progressing Goal: Individualized Educational Video(s) Outcome: Progressing   Problem: Coping: Goal: Will verbalize  positive feelings about self Outcome: Progressing Goal: Will identify appropriate support needs Outcome: Progressing   Problem: Health Behavior/Discharge Planning: Goal: Ability to manage health-related needs will improve Outcome: Progressing   Problem: Self-Care: Goal: Ability to participate in self-care as condition permits will improve Outcome: Progressing Goal: Verbalization of feelings and concerns over difficulty with self-care will improve Outcome: Progressing Goal: Ability to communicate needs accurately will improve Outcome: Progressing   Problem: Nutrition: Goal: Risk of aspiration will decrease Outcome: Progressing Goal: Dietary intake will improve Outcome: Progressing   Problem: Ischemic Stroke/TIA Tissue Perfusion: Goal: Complications of ischemic stroke/TIA will be minimized Outcome: Progressing   Problem: Safety: Goal: Non-violent Restraint(s) Outcome: Progressing   

## 2020-06-19 NOTE — Plan of Care (Signed)
  Problem: Education: Goal: Knowledge of General Education information will improve Description: Including pain rating scale, medication(s)/side effects and non-pharmacologic comfort measures Outcome: Progressing   Problem: Clinical Measurements: Goal: Ability to maintain clinical measurements within normal limits will improve Outcome: Progressing Goal: Will remain free from infection Outcome: Progressing Goal: Diagnostic test results will improve Outcome: Progressing Goal: Respiratory complications will improve Outcome: Progressing Goal: Cardiovascular complication will be avoided Outcome: Progressing   Problem: Safety: Goal: Ability to remain free from injury will improve Outcome: Progressing   Problem: Ischemic Stroke/TIA Tissue Perfusion: Goal: Complications of ischemic stroke/TIA will be minimized Outcome: Progressing

## 2020-06-20 LAB — CBC
HCT: 49.3 % (ref 39.0–52.0)
Hemoglobin: 16.7 g/dL (ref 13.0–17.0)
MCH: 29.7 pg (ref 26.0–34.0)
MCHC: 33.9 g/dL (ref 30.0–36.0)
MCV: 87.6 fL (ref 80.0–100.0)
Platelets: 82 10*3/uL — ABNORMAL LOW (ref 150–400)
RBC: 5.63 MIL/uL (ref 4.22–5.81)
RDW: 12.8 % (ref 11.5–15.5)
WBC: 6.1 10*3/uL (ref 4.0–10.5)
nRBC: 0 % (ref 0.0–0.2)

## 2020-06-20 LAB — GLUCOSE, CAPILLARY
Glucose-Capillary: 161 mg/dL — ABNORMAL HIGH (ref 70–99)
Glucose-Capillary: 132 mg/dL — ABNORMAL HIGH (ref 70–99)

## 2020-06-20 MED ORDER — ACETAMINOPHEN 650 MG RE SUPP: 650.0000 mg | Freq: Four times a day (QID) | RECTAL | Status: DC | PRN

## 2020-06-20 MED ORDER — ACETAMINOPHEN 325 MG PO TABS: 650.0000 mg | ORAL_TABLET | Freq: Four times a day (QID) | ORAL | Status: DC | PRN

## 2020-06-20 NOTE — Progress Notes (Signed)
Physical Therapy Treatment Patient Details Name: Frank Luna MRN: [id number] DOB: 07/31/1960 Today's Date: 06/20/2020    History of Present Illness Pt is a 60 y.o. male who presented 4/30 with L-sided weakness and then left AMA then returned. Pt has hx of several prior CVA and TIA events, most recently left ED in April 2021 AMA after having TIA. Pt with poor compliance with medication. During this hospitalization, pt has sustained 2 falls secondary to pt not following directions and safety precautions placed by staff. MRI revealed 1 cm acute infarct in the corona radiata white matter on the R. L elbow and head imaging negative for acute findings following 2nd fall. PMH: CVA, HTN, DM, and thrombosytopenia.    PT Comments    Pt eager to mobilize with therapies, PT and OT disciplines seeing pt together for increased therapeutic input in standing tasks. Pt tolerated x2 short bouts of gait today, with emphasis placed on sequencing/weight-shifting, midline posture, and L knee posture during swing/stance. Pt requires frequent cues during mobility for form and safety, but responds well to all cues and implements immediately. Pt continues to present with L inattention, requires cues for postural adjustment away from L and LUE/LE safe positioning. PT to continue to recommend CIR.    Follow Up Recommendations  CIR;Supervision/Assistance - 24 hour     Equipment Recommendations  Rolling walker with 5" wheels;3in1 (PT)    Recommendations for Other Services Rehab consult     Precautions / Restrictions Precautions Precautions: Fall Precaution Comments: L-sided paresis and incoordination Restrictions Weight Bearing Restrictions: No    Mobility  Bed Mobility Overal bed mobility: Needs Assistance             General bed mobility comments: up in chair upon arrival    Transfers Overall transfer level: Needs assistance Equipment used: 2 person hand held assist Transfers: Sit to/from Stand Sit  to Stand: Mod assist;+2 physical assistance         General transfer comment: mod +2 for rise, steady, and correcting postural bias to L. STS x3, from recliner x1 and room chair x2  Ambulation/Gait Ambulation/Gait assistance: Mod assist;+2 physical assistance;+2 safety/equipment Gait Distance (Feet): 15 Feet (+10) Assistive device: 2 person hand held assist Gait Pattern/deviations: Decreased step length - left;Decreased stance time - left;Decreased stride length;Narrow base of support;Step-through pattern;Drifts right/left;Trunk flexed Gait velocity: decr   General Gait Details: Mod +2 for steadying, progressing LLE during swing phase and guarding L knee during stance (at times goes into genu recurvatum so requires posterior knee unlocking to prevent hyperextension). Verbal cuing for midline posture, sequencing/weight-shifting. Seated rest after first bout of gait and 3 minutes of standing at sink for ADL tasks.   Stairs             Wheelchair Mobility    Modified Rankin (Stroke Patients Only) Modified Rankin (Stroke Patients Only) Pre-Morbid Rankin Score: No symptoms Modified Rankin: Moderately severe disability     Balance Overall balance assessment: Needs assistance Sitting-balance support: No upper extremity supported;Feet supported Sitting balance-Leahy Scale: Fair     Standing balance support: Bilateral upper extremity supported;During functional activity Standing balance-Leahy Scale: Poor Standing balance comment: reliant on external assist; standing tasks at sink with standing tolerance ~3 minutes, LUE propped in WB position for proprioceptive input                            Cognition Arousal/Alertness: Awake/alert Behavior During Therapy: WFL for tasks assessed/performed;Impulsive Overall  Cognitive Status: Impaired/Different from baseline Area of Impairment: Memory;Following commands;Safety/judgement;Awareness;Problem solving                      Memory: Decreased short-term memory Following Commands: Follows one step commands consistently Safety/Judgement: Decreased awareness of safety;Decreased awareness of deficits Awareness: Intellectual Problem Solving: Decreased initiation;Difficulty sequencing;Requires verbal cues;Requires tactile cues General Comments: Pt initiates mobility as soon as he is cued, at times impulsively. Pt inattentive to L, especially when participating in ADL or mobility tasks. Pt requires frequent cues to correct balance, but after 3-4 cues pt recognizes need to correct balance without PT/COTA input.      Exercises      General Comments General comments (skin integrity, edema, etc.): vss      Pertinent Vitals/Pain Pain Assessment: No/denies pain Faces Pain Scale: No hurt Pain Intervention(s): Limited activity within patient's tolerance    Home Living                      Prior Function            PT Goals (current goals can now be found in the care plan section) Acute Rehab PT Goals Patient Stated Goal: to get "back to normal" PT Goal Formulation: With patient Time For Goal Achievement: 07/02/20 Potential to Achieve Goals: Good Progress towards PT goals: Progressing toward goals    Frequency    Min 4X/week      PT Plan Current plan remains appropriate    Co-evaluation PT/OT/SLP Co-Evaluation/Treatment: Yes Reason for Co-Treatment: For patient/therapist safety;To address functional/ADL transfers PT goals addressed during session: Mobility/safety with mobility;Balance;Strengthening/ROM        AM-PAC PT "6 Clicks" Mobility   Outcome Measure  Help needed turning from your back to your side while in a flat bed without using bedrails?: A Little Help needed moving from lying on your back to sitting on the side of a flat bed without using bedrails?: A Little Help needed moving to and from a bed to a chair (including a wheelchair)?: A Little Help needed standing up  from a chair using your arms (e.g., wheelchair or bedside chair)?: A Lot Help needed to walk in hospital room?: A Lot Help needed climbing 3-5 steps with a railing? : Total 6 Click Score: 14    End of Session Equipment Utilized During Treatment: Gait belt Activity Tolerance: Patient tolerated treatment well Patient left: in chair;with call bell/phone within reach;with chair alarm set Nurse Communication: Mobility status PT Visit Diagnosis: Repeated falls (R29.6);Difficulty in walking, not elsewhere classified (R26.2);Hemiplegia and hemiparesis Hemiplegia - Right/Left: Left Hemiplegia - dominant/non-dominant: Non-dominant Hemiplegia - caused by: Cerebral infarction     Time: 1610-9604 PT Time Calculation (min) (ACUTE ONLY): 25 min  Charges:  $Gait Training: 8-22 mins                     Marye Round, PT DPT Acute Rehabilitation Services Pager [phone]  Office [phone]   Truddie Coco 06/20/2020, 4:23 PM

## 2020-06-20 NOTE — PMR Pre-admission (Addendum)
PMR Admission Coordinator Pre-Admission Assessment   Patient: Frank Luna is an 60 y.o., male MRN: [id number] DOB: 03/27/1960 Height:   Weight:                                                                                                                                                    Insurance Information HMO:     PPO:      PCP:      IPA:      80/20:      OTHER:  PRIMARY: Medicaid [employer]      Policy#: [id number]      Subscriber: pt CM Name: faxed      Phone#: n/a     Fax#: [phone]  Pre-Cert#: [id number] auth for CIR via faxed approval with updates due to fax above on 5/9      Employer:  Benefits:  Phone #: [phone]     Name:  Eff. Date: eligible as of 06/18/20     Deduct:       Out of Pocket Max:       Life Max:   CIR:       SNF:  Outpatient:      Co-Pay:  Home Health:       Co-Pay:  DME:      Co-Pay:  Providers:  SECONDARY:       Policy#:       Phone#:    Financial Counselor:       Phone#:    The "Data Collection Information Summary" for patients in Inpatient Rehabilitation Facilities with attached "Privacy Act Statement-Health Care Records" was provided and verbally reviewed with: Patient   Emergency Contact Information Contact Information       Name Relation Home Work Mobile    Luna,Frank Spouse [phone]   [phone]    Luna, Frank Daughter     [phone]         Current Medical History  Patient Admitting Diagnosis: R corona radiata CVA   History of Present Illness: Frank Luna is a 60 y.o. right-handed male with history of diabetes mellitus, hypertension, tobacco use, thrombocytopenia, prior stroke 2017 with minimal residual weakness maintained on aspirin and Plavix.  Presented with acute onset of left-sided weakness and slurred speech.  Initially presented to  Hospital on  after CT negative, patient left AMA, but returning on 06/17/20 with persistent symptoms.  MRI showed a 1 cm acute infarction in the corona radiata white matter on the  right.  Extensive chronic small vessel ischemic changes elsewhere throughout the brain as well as incidental findings of bilateral mastoid effusions.  CT angiogram of the head showed no emergent large vessel occlusion.  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  Admission chemistries unremarkable except glucose 126, alcohol negative, hemoglobin A1c 8.3, urine   drug screen negative.  Echocardiogram with ejection fraction of 60 to 65% no wall motion abnormalities.  Presently maintained on aspirin 81 mg daily and Plavix 75 mg daily for CVA prophylaxis x3 weeks then aspirin alone.  Subcutaneous Lovenox for DVT prophylaxis.  Tolerating a regular consistency diet.  Due to patient's left-sided weakness and slurred speech recommendations of physical medicine rehab consult.   Complete NIHSS TOTAL: 4 Glasgow Coma Scale Score: 15   Past Medical History      Past Medical History:  Diagnosis Date  . Diabetes (HCC)    . Hypertension    . Stroke (HCC)    . Thrombocytopenia (HCC)        Family History  family history includes Stroke in his paternal great-grandfather.   Prior Rehab/Hospitalizations:  Has the patient had prior rehab or hospitalizations prior to admission? Yes 2017 admit to [facility] for CVA   Has the patient had major surgery during 100 days prior to admission? No   Current Medications    Current Facility-Administered Medications:  .  acetaminophen (TYLENOL) tablet 650 mg, 650 mg, Oral, Q6H PRN **OR** acetaminophen (TYLENOL) suppository 650 mg, 650 mg, Rectal, Q6H PRN, Winters, Steven, MD .  amLODipine (NORVASC) tablet 5 mg, 5 mg, Oral, Daily, Winters, Steven, MD, 5 mg at 06/21/20 0946 .  aspirin EC tablet 81 mg, 81 mg, Oral, Daily, Chen, Joshua Y, MD, 81 mg at 06/21/20 0945 .  atorvastatin (LIPITOR) tablet 40 mg, 40 mg, Oral, q1800, Winters, Steven, MD, 40 mg at 06/20/20 1825 .  cholecalciferol (VITAMIN D3) tablet 1,000  Units, 1,000 Units, Oral, Daily, Chen, Joshua Y, MD, 1,000 Units at 06/21/20 0944 .  clopidogrel (PLAVIX) tablet 75 mg, 75 mg, Oral, Daily, Xu, Jindong, MD, 75 mg at 06/21/20 0944 .  dapagliflozin propanediol (FARXIGA) tablet 10 mg, 10 mg, Oral, Daily, Doda, Vandana, MD, 10 mg at 06/21/20 0944 .  enoxaparin (LOVENOX) injection 40 mg, 40 mg, Subcutaneous, Q24H, Xu, Jindong, MD, 40 mg at 06/20/20 2149 .  insulin glargine (LANTUS) injection 12 Units, 12 Units, Subcutaneous, QHS, Winters, Steven, MD, 12 Units at 06/20/20 2149 .  lisinopril (ZESTRIL) tablet 40 mg, 40 mg, Oral, Daily, Xu, Jindong, MD, 40 mg at 06/21/20 0944 .  metoprolol succinate (TOPROL-XL) 24 hr tablet 75 mg, 75 mg, Oral, Daily, Xu, Jindong, MD, 75 mg at 06/21/20 0944 .  nicotine (NICODERM CQ - dosed in mg/24 hours) patch 21 mg, 21 mg, Transdermal, Daily, Hoffman, Erik C, DO .  nicotine polacrilex (NICORETTE) gum 2 mg, 2 mg, Oral, PRN, Hoffman, Erik C, DO .  senna-docusate (Senokot-S) tablet 1 tablet, 1 tablet, Oral, QHS PRN, Winters, Steven, MD   Patients Current Diet:  Diet Order                  Diet Carb Modified             Diet Carb Modified Fluid consistency: Thin; Room service appropriate? Yes with Assist  Diet effective now                         Precautions / Restrictions Precautions Precautions: Fall Precaution Comments: L-sided paresis and incoordination Restrictions Weight Bearing Restrictions: No    Has the patient had 2 or more falls or a fall with injury in the past year?Yes, at least 2 this admission   Prior Activity Level Community (5-7x/wk): driving prior to admit, no DME used, retired/disabled   Prior Functional Level Prior   Function Level of Independence: Independent Comments: Pt was driving. Reports he was on disability.   Self Care: Did the patient need help bathing, dressing, using the toilet or eating?  Independent   Indoor Mobility: Did the patient need assistance with walking from  room to room (with or without device)? Independent   Stairs: Did the patient need assistance with internal or external stairs (with or without device)? Independent   Functional Cognition: Did the patient need help planning regular tasks such as shopping or remembering to take medications? Independent   Home Assistive Devices / Equipment Home Assistive Devices/Equipment: None Home Equipment: Shower seat   Prior Device Use: Indicate devices/aids used by the patient prior to current illness, exacerbation or injury? None of the above   Current Functional Level Cognition   Arousal/Alertness: Awake/alert Overall Cognitive Status: Impaired/Different from baseline Orientation Level: Oriented to person,Oriented to place,Oriented to situation Following Commands: Follows one step commands consistently,Follows multi-step commands with increased time Safety/Judgement: Decreased awareness of safety,Decreased awareness of deficits General Comments: Pt initiates mobility as soon as he is cued, at times impulsively. Pt inattentive to L, especially when participating in ADL or mobility tasks. Pt requires frequent cues to correct balance, but after 3-4 cues pt recognizes need to correct balance without PT/COTA input. Attention: Focused,Sustained Focused Attention: Appears intact Sustained Attention: Impaired Sustained Attention Impairment: Verbal complex Memory: Impaired Memory Impairment: Retrieval deficit,Decreased recall of new information (Immediate: 4/5; delayed: 0/5; with cues: 0/5) Awareness: Impaired Awareness Impairment: Intellectual impairment Problem Solving: Impaired Problem Solving Impairment: Verbal complex,Verbal basic Executive Function: Sequencing,Organizing Sequencing: Impaired Sequencing Impairment: Verbal complex (Clock drawing: 0/4) Organizing: Impaired Organizing Impairment: Verbal complex (Backward digit span: 0/3)    Extremity Assessment (includes Sensation/Coordination)    Upper Extremity Assessment: LUE deficits/detail LUE Deficits / Details: Has limited movement at shoulder (can raise arm ~20 degrees) and at elbow (can touch hand to chin but brings head down to meet hand as well); reports it feels the same as his RUE (but has decreased proprioception with testing) LUE Sensation: decreased proprioception LUE Coordination: decreased fine motor,decreased gross motor  Lower Extremity Assessment: LLE deficits/detail LLE Deficits / Details: MMT scores of 4+ to 5, but impaired coordination limiting AROM with tasks; delayed proprioception detected in ankle and likely in knee, but not tested at knee LLE Sensation: decreased proprioception LLE Coordination: decreased gross motor,decreased fine motor (dysdiadochokinesia noted and possible dysmetria)     ADLs   Overall ADL's : Needs assistance/impaired Eating/Feeding: Set up,Sitting Eating/Feeding Details (indicate cue type and reason): in recliner Grooming: Oral care,Standing,Moderate assistance Grooming Details (indicate cue type and reason): standing at sink with pt needing MOD A +2 for standing balance and cues for sequencing to use LUE as stabilizer Upper Body Bathing: Moderate assistance,Sitting Upper Body Bathing Details (indicate cue type and reason): in recliner Lower Body Bathing: Moderate assistance Lower Body Bathing Details (indicate cue type and reason): Min A +2 sit<>stand Upper Body Dressing : Moderate assistance,Sitting Upper Body Dressing Details (indicate cue type and reason): to don back side gown, cues needed to use RUE as hand over hand assist to thread LUE through sleeve of gown Lower Body Dressing: Maximal assistance Lower Body Dressing Details (indicate cue type and reason): Min A +2 sit<>stand Toilet Transfer: Maximal assistance,+2 for physical assistance,Ambulation Toilet Transfer Details (indicate cue type and reason): simulated via functional mobiltiy within pts room, MOD A +2 to ambulate  from recliner<>sink with pt needing assist at LLE to progress LLE during gait Toileting- Clothing   Manipulation and Hygiene: Maximal assistance Toileting - Clothing Manipulation Details (indicate cue type and reason): Min A +2 sit<>stand Functional mobility during ADLs: Maximal assistance,+2 for physical assistance General ADL Comments: pt continues to present with L sided hemiparesis, impaired balacne, and impaired strength and coordination     Mobility   Overal bed mobility: Needs Assistance Bed Mobility: Supine to Sit Supine to sit: Min assist General bed mobility comments: OOb in recliner and returned to recliner at end of session     Transfers   Overall transfer level: Needs assistance Equipment used: 2 person hand held assist Transfers: Sit to/from Stand Sit to Stand: Mod assist,+2 physical assistance Stand pivot transfers: Min assist,+2 physical assistance,+2 safety/equipment General transfer comment: mod +2 for rise, steady, and correcting postural bias to L. STS x3, from recliner x1 and room chair x2, cues needed to shift weight to L during ADLs and Wb thro LUE at sink     Ambulation / Gait / Stairs / Wheelchair Mobility   Ambulation/Gait Ambulation/Gait assistance: Mod assist,+2 physical assistance,+2 safety/equipment Gait Distance (Feet): 15 Feet (+10) Assistive device: 2 person hand held assist Gait Pattern/deviations: Decreased step length - left,Decreased stance time - left,Decreased stride length,Narrow base of support,Step-through pattern,Drifts right/left,Trunk flexed General Gait Details: Mod +2 for steadying, progressing LLE during swing phase and guarding L knee during stance (at times goes into genu recurvatum so requires posterior knee unlocking to prevent hyperextension). Verbal cuing for midline posture, sequencing/weight-shifting. Seated rest after first bout of gait and 3 minutes of standing at sink for ADL tasks. Gait velocity: decr Gait velocity interpretation:  <1.31 ft/sec, indicative of household ambulator     Posture / Balance Dynamic Sitting Balance Sitting balance - Comments: Static sitting EOB no LOB, supervision for safety. Balance Overall balance assessment: Needs assistance Sitting-balance support: No upper extremity supported,Feet supported Sitting balance-Leahy Scale: Fair Sitting balance - Comments: Static sitting EOB no LOB, supervision for safety. Standing balance support: Bilateral upper extremity supported,During functional activity Standing balance-Leahy Scale: Poor Standing balance comment: reliant on external assist; standing tasks at sink with standing tolerance ~3 minutes, LUE propped in WB position for proprioceptive input     Special needs/care consideration Skin abrasion to elbow from fall, Diabetic management yes and Behavioral consideration hx of leaving AMA        Previous Home Environment (from acute therapy documentation) Living Arrangements: Spouse/significant other  Lives With: Spouse Available Help at Discharge: Family Type of Home: House Home Layout: One level Home Access: Stairs to enter Entrance Stairs-Rails: Can reach both Entrance Stairs-Number of Steps: 5 Bathroom Shower/Tub: Walk-in shower Bathroom Toilet: Handicapped height Home Care Services: No   Discharge Living Setting Plans for Discharge Living Setting: Patient's home Type of Home at Discharge: House Discharge Home Layout: One level Discharge Home Access: Stairs to enter Entrance Stairs-Rails: Can reach both Entrance Stairs-Number of Steps: 7 to back door (primary entrance), 3 to front door Discharge Bathroom Shower/Tub: Walk-in shower Discharge Bathroom Toilet: Handicapped height Discharge Bathroom Accessibility: Yes How Accessible: Accessible via walker Does the patient have any problems obtaining your medications?: No   Social/Family/Support Systems Patient Roles: Spouse Anticipated Caregiver: spouse, Frank Warnell Anticipated  Caregiver's Contact Information: [phone] Ability/Limitations of Caregiver: supervision Caregiver Availability: 24/7 Discharge Plan Discussed with Primary Caregiver: Yes Is Caregiver In Agreement with Plan?: Yes Does Caregiver/Family have Issues with Lodging/Transportation while Pt is in Rehab?: No     Goals Patient/Family Goal for Rehab: PT mod I, OT supervision to min, SLP mod

## 2020-06-20 NOTE — Progress Notes (Signed)
Inpatient Rehab Admissions Coordinator:   Awaiting determination from insurance.   Estill Dooms, PT, DPT Admissions Coordinator [phone] 06/20/20  11:48 AM

## 2020-06-20 NOTE — Progress Notes (Signed)
Occupational Therapy Treatment Patient Details Name: Frank Luna MRN: [id number] DOB: 27-Oct-1960 Today's Date: 06/20/2020    History of present illness Pt is a 60 y.o. male who presented 4/30 with L-sided weakness and then left AMA then returned. Pt has hx of several prior CVA and TIA events, most recently left ED in April 2021 AMA after having TIA. Pt with poor compliance with medication. During this hospitalization, pt has sustained 2 falls secondary to pt not following directions and safety precautions placed by staff. MRI revealed 1 cm acute infarct in the corona radiata white matter on the R. L elbow and head imaging negative for acute findings following 2nd fall. PMH: CVA, HTN, DM, and thrombosytopenia.   OT comments  Pt seen in conjunction with PT to optimize pts activity tolerance. Pt continues to present with L hemiparesis, impaired balance and impaired strength and motor planning with pt needing MAX A +2 to ambulate from recliner<>sink. Pt able to stand at sink ~ 3 mins with LUE supported on sink for grooming tasks with MOD A +2 for standing balance. Education provided on using LUE as stabilizer during ADLs. Pt needed repeated cued for midline posture during standing grooming. Worked on hand over hand assist with RUE over LUE from sitting in chair with BUEs on sink wiping off sink R<>L. Pt would continue to benefit from skilled occupational therapy while admitted and after d/c to address the below listed limitations in order to improve overall functional mobility and facilitate independence with BADL participation. DC plan remains appropriate, will follow acutely per POC.    Follow Up Recommendations  CIR;Supervision/Assistance - 24 hour    Equipment Recommendations  Other (comment) (TBD at next venue of care)    Recommendations for Other Services      Precautions / Restrictions Precautions Precautions: Fall Precaution Comments: L-sided paresis and incoordination Restrictions Weight  Bearing Restrictions: No       Mobility Bed Mobility Overal bed mobility: Needs Assistance             General bed mobility comments: OOb in recliner and returned to recliner at end of session    Transfers Overall transfer level: Needs assistance Equipment used: 2 person hand held assist Transfers: Sit to/from Stand Sit to Stand: Mod assist;+2 physical assistance         General transfer comment: mod +2 for rise, steady, and correcting postural bias to L. STS x3, from recliner x1 and room chair x2, cues needed to shift weight to L during ADLs and Wb thro LUE at sink    Balance Overall balance assessment: Needs assistance Sitting-balance support: No upper extremity supported;Feet supported Sitting balance-Leahy Scale: Fair     Standing balance support: Bilateral upper extremity supported;During functional activity Standing balance-Leahy Scale: Poor Standing balance comment: reliant on external assist; standing tasks at sink with standing tolerance ~3 minutes, LUE propped in WB position for proprioceptive input                           ADL either performed or assessed with clinical judgement   ADL Overall ADL's : Needs assistance/impaired     Grooming: Oral care;Standing;Moderate assistance Grooming Details (indicate cue type and reason): standing at sink with pt needing MOD A +2 for standing balance and cues for sequencing to use LUE as stabilizer         Upper Body Dressing : Moderate assistance;Sitting Upper Body Dressing Details (indicate cue type and reason): to  don back side gown, cues needed to use RUE as hand over hand assist to thread LUE through sleeve of gown     Toilet Transfer: Maximal assistance;+2 for physical assistance;Ambulation Toilet Transfer Details (indicate cue type and reason): simulated via functional mobiltiy within pts room, MOD A +2 to ambulate from recliner<>sink with pt needing assist at LLE to progress LLE during gait          Functional mobility during ADLs: Maximal assistance;+2 for physical assistance General ADL Comments: pt continues to present with L sided hemiparesis, impaired balacne, and impaired strength and coordination     Vision       Perception     Praxis      Cognition Arousal/Alertness: Awake/alert Behavior During Therapy: WFL for tasks assessed/performed;Impulsive Overall Cognitive Status: Impaired/Different from baseline Area of Impairment: Memory;Following commands;Safety/judgement;Awareness;Problem solving                     Memory: Decreased short-term memory (repeated cues for LUE placement) Following Commands: Follows one step commands consistently;Follows multi-step commands with increased time Safety/Judgement: Decreased awareness of safety;Decreased awareness of deficits Awareness: Intellectual Problem Solving: Decreased initiation;Difficulty sequencing;Requires verbal cues;Requires tactile cues General Comments: Pt initiates mobility as soon as he is cued, at times impulsively. Pt inattentive to L, especially when participating in ADL or mobility tasks. Pt requires frequent cues to correct balance, but after 3-4 cues pt recognizes need to correct balance without PT/COTA input.        Exercises Other Exercises Other Exercises: encouraged self ROM of shoulder flexion to ~ 90*, elbow flexion/ extension, wrist flexion/extension, and digit composite flexion/ extension. pt with 2+/5 elbow flexion/ extension, 2+/5 composite digit flexion/ extension, 2+/5 scapular elevation/ depression, 2+5/ wrist flexion/extension Other Exercises: handover  hand horizontal shoulder ADD/ ABD with wash cloth on sink   Shoulder Instructions       General Comments VSS    Pertinent Vitals/ Pain       Pain Assessment: No/denies pain Faces Pain Scale: No hurt Pain Intervention(s): Limited activity within patient's tolerance  Home Living                                           Prior Functioning/Environment              Frequency  Min 2X/week        Progress Toward Goals  OT Goals(current goals can now be found in the care plan section)  Progress towards OT goals: Progressing toward goals  Acute Rehab OT Goals Patient Stated Goal: to get "back to normal" OT Goal Formulation: With patient Time For Goal Achievement: 07/02/20 Potential to Achieve Goals: Good  Plan Discharge plan remains appropriate;Frequency remains appropriate    Co-evaluation      Reason for Co-Treatment: For patient/therapist safety;To address functional/ADL transfers PT goals addressed during session: Mobility/safety with mobility;Balance;Strengthening/ROM OT goals addressed during session: ADL's and self-care;Strengthening/ROM      AM-PAC OT "6 Clicks" Daily Activity     Outcome Measure   Help from another person eating meals?: A Little Help from another person taking care of personal grooming?: A Lot Help from another person toileting, which includes using toliet, bedpan, or urinal?: A Lot Help from another person bathing (including washing, rinsing, drying)?: A Lot Help from another person to put on and taking off regular upper body clothing?: A Lot Help  from another person to put on and taking off regular lower body clothing?: Total 6 Click Score: 12    End of Session Equipment Utilized During Treatment: Gait belt  OT Visit Diagnosis: Unsteadiness on feet (R26.81);Other abnormalities of gait and mobility (R26.89);Muscle weakness (generalized) (M62.81);Other symptoms and signs involving cognitive function;History of falling (Z91.81);Repeated falls (R29.6);Hemiplegia and hemiparesis Hemiplegia - Right/Left: Left Hemiplegia - dominant/non-dominant: Non-Dominant Hemiplegia - caused by: Cerebral infarction   Activity Tolerance Patient tolerated treatment well   Patient Left in chair;with call bell/phone within reach;with chair alarm set   Nurse  Communication Mobility status        Time: 9678-9381 OT Time Calculation (min): 28 min  Charges: OT General Charges $OT Visit: 1 Visit OT Treatments $Self Care/Home Management : 8-22 mins  Lenor Derrick., COTA/L Acute Rehabilitation Services [phone]    Barron Schmid 06/20/2020, 4:45 PM

## 2020-06-20 NOTE — Plan of Care (Signed)
  Problem: Education: Goal: Knowledge of General Education information will improve Description: Including pain rating scale, medication(s)/side effects and non-pharmacologic comfort measures Outcome: Progressing   Problem: Clinical Measurements: Goal: Ability to maintain clinical measurements within normal limits will improve Outcome: Progressing Goal: Will remain free from infection Outcome: Progressing Goal: Diagnostic test results will improve Outcome: Progressing Goal: Respiratory complications will improve Outcome: Progressing Goal: Cardiovascular complication will be avoided Outcome: Progressing   Problem: Ischemic Stroke/TIA Tissue Perfusion: Goal: Complications of ischemic stroke/TIA will be minimized Outcome: Progressing   

## 2020-06-20 NOTE — Progress Notes (Addendum)
   Subjective: No acute events overnight.  Patient seen and evaluated at bedside this AM. He states that he is doing okay, but still has difficulty with gripping his fingers. He additionally endorses that he continues to have numbness over the left hand. He is able to lift up his arm, but it feels very heavy. Additionally endorses a headache this morning.   Objective:  Vital signs in last 24 hours: Vitals:   06/19/20 2318 06/20/20 0328 06/20/20 0725 06/20/20 1216  BP: (!) 161/87 (!) 164/94 (!) 163/63 (!) 158/64  Pulse:   (!) 56 (!) 51  Resp: 17 18 16 18   Temp: 98 F (36.7 C) 98 F (36.7 C) 98.2 F (36.8 C) 97.7 F (36.5 C)  TempSrc: Oral Oral Oral Oral  SpO2: 95% 99% 95% 93%     Physical Exam Constitutional: no acute distress Head: atraumatic ENT: external ears normal Eyes: EOMI Cardiovascular: regular rate and rhythm, normal heart sounds Pulmonary: effort normal, lungs clear to ascultation bilaterally Abdominal: flat, nontender, no rebound tenderness, bowel sounds normal Musculoskeletal: Range of motion good Skin: warm and dry Neurological: alert, Cranial nerves intact no aphasia Extraocular movements intact, saccadic eye movements Left upper extremity strength 3 out of 5 Able to lift Left hand little bit with effort. Weak hand grip.    Left lower extremity strength 4 out of 5 Sensation intact throughout No dysmetria  Psychiatric: normal mood and affect  Assessment/Plan: Frank Luna is a 60 y.o. male with hx of stroke, TIA, hypertension, diabetes presenting for worsening left-sided weakness, found to have stroke on MRI.  Principal Problem:   Acute cerebral infarction [facility]) Active Problems:   Tobacco use disorder   Essential hypertension   Diabetes (HCC)   Stroke (HCC)  Acute stroke of the right corona radiata CT head negative for acute bleed. MRI demonstrated 1 cm acute infarction in the corona radiata white matter on the right.  Also extensive chronic small  vessel disease elsewhere.  CTA demonstrated moderate stenosis of right vertebral artery, mild posterior circulation atherosclerosis, bilateral carotid atherosclerosis but no hemodynamically significant stenosis. ECHO-LVEF 60 to 65%,  bubble study negative. Now pending insurance approval and bed availability at CIR . Pt continues to have weakness and numbness in left UE.  Neurology following, appreciate further recommendations - Permissive hypertension - PT/OT-recommends CIR, Waiting for insurance approval.  - Telemetry - Continue home atorvastatin for secondary stroke prevention - Continue aspirin 81 mg and Plavix 75 mg for 3 weeks per neurology and then ASA alone.  High Fall risk Pt had 2 falls back to back Saturday evening when he was trying to sit on chair. He hit his head and Left elbow.  X ray left Elbow and CT head done which was normal. No more falls since then. -Fall precautions.  Type 2 diabetes mellitus on Insulin A1c 8.3 at admission. Last BG 161.  -On Lantus 12 units nightly -Consider GLP-1 agonist at discharge.   Hypertension Lisinopril 40 mg daily Metoprolol succinate 75 mg daily  Diet:  Carb modified Thin  IVF:  None VTE:  Lovenox Prior to Admission Living Arrangement:  Home Anticipated Discharge Location:  CIR Barriers to Discharge: Insurance approval and bed availability to CIR Dispo: Anticipated discharge in approximately 1-2 day(s).   Wednesday, MD 06/20/2020, 1:13 PM   On Call pager [phone]

## 2020-06-21 ENCOUNTER — Encounter (HOSPITAL_COMMUNITY): Payer: Self-pay | Admitting: Physical Medicine & Rehabilitation

## 2020-06-21 ENCOUNTER — Inpatient Hospital Stay (HOSPITAL_COMMUNITY)
Admission: RE | Admit: 2020-06-21 | Discharge: 2020-07-04 | DRG: 057 | Disposition: A | Discharge status: Home Health | Payer: Medicaid Other | Source: Intra-hospital | Attending: Physical Medicine & Rehabilitation | Admitting: Physical Medicine & Rehabilitation

## 2020-06-21 DIAGNOSIS — I69354 Hemiplegia and hemiparesis following cerebral infarction affecting left non-dominant side: Secondary | ICD-10-CM | POA: Diagnosis present

## 2020-06-21 DIAGNOSIS — F17210 Nicotine dependence, cigarettes, uncomplicated: Secondary | ICD-10-CM | POA: Diagnosis present

## 2020-06-21 DIAGNOSIS — Z794 Long term (current) use of insulin: Secondary | ICD-10-CM

## 2020-06-21 DIAGNOSIS — Z823 Family history of stroke: Secondary | ICD-10-CM

## 2020-06-21 DIAGNOSIS — Z8673 Personal history of transient ischemic attack (TIA), and cerebral infarction without residual deficits: Secondary | ICD-10-CM

## 2020-06-21 DIAGNOSIS — I69319 Unspecified symptoms and signs involving cognitive functions following cerebral infarction: Secondary | ICD-10-CM

## 2020-06-21 DIAGNOSIS — E8809 Other disorders of plasma-protein metabolism, not elsewhere classified: Secondary | ICD-10-CM | POA: Diagnosis present

## 2020-06-21 DIAGNOSIS — I10 Essential (primary) hypertension: Secondary | ICD-10-CM | POA: Diagnosis present

## 2020-06-21 DIAGNOSIS — Z9119 Patient's noncompliance with other medical treatment and regimen: Secondary | ICD-10-CM | POA: Diagnosis not present

## 2020-06-21 DIAGNOSIS — E785 Hyperlipidemia, unspecified: Secondary | ICD-10-CM | POA: Diagnosis present

## 2020-06-21 DIAGNOSIS — Z7902 Long term (current) use of antithrombotics/antiplatelets: Secondary | ICD-10-CM | POA: Diagnosis not present

## 2020-06-21 DIAGNOSIS — Z880 Allergy status to penicillin: Secondary | ICD-10-CM

## 2020-06-21 DIAGNOSIS — Z7982 Long term (current) use of aspirin: Secondary | ICD-10-CM | POA: Diagnosis not present

## 2020-06-21 DIAGNOSIS — I69322 Dysarthria following cerebral infarction: Secondary | ICD-10-CM | POA: Diagnosis not present

## 2020-06-21 DIAGNOSIS — I63031 Cerebral infarction due to thrombosis of right carotid artery: Secondary | ICD-10-CM | POA: Diagnosis not present

## 2020-06-21 DIAGNOSIS — Z23 Encounter for immunization: Secondary | ICD-10-CM | POA: Diagnosis not present

## 2020-06-21 DIAGNOSIS — Z79899 Other long term (current) drug therapy: Secondary | ICD-10-CM

## 2020-06-21 DIAGNOSIS — G8320 Monoplegia of upper limb affecting unspecified side: Secondary | ICD-10-CM

## 2020-06-21 DIAGNOSIS — E1159 Type 2 diabetes mellitus with other circulatory complications: Secondary | ICD-10-CM

## 2020-06-21 DIAGNOSIS — Z91199 Patient's noncompliance with other medical treatment and regimen due to unspecified reason: Secondary | ICD-10-CM

## 2020-06-21 DIAGNOSIS — Z6831 Body mass index (BMI) 31.0-31.9, adult: Secondary | ICD-10-CM

## 2020-06-21 DIAGNOSIS — I639 Cerebral infarction, unspecified: Secondary | ICD-10-CM | POA: Diagnosis present

## 2020-06-21 DIAGNOSIS — E46 Unspecified protein-calorie malnutrition: Secondary | ICD-10-CM | POA: Diagnosis present

## 2020-06-21 DIAGNOSIS — R7309 Other abnormal glucose: Secondary | ICD-10-CM

## 2020-06-21 DIAGNOSIS — E1165 Type 2 diabetes mellitus with hyperglycemia: Secondary | ICD-10-CM | POA: Diagnosis present

## 2020-06-21 DIAGNOSIS — I69328 Other speech and language deficits following cerebral infarction: Secondary | ICD-10-CM

## 2020-06-21 DIAGNOSIS — Z7984 Long term (current) use of oral hypoglycemic drugs: Secondary | ICD-10-CM | POA: Diagnosis not present

## 2020-06-21 DIAGNOSIS — I679 Cerebrovascular disease, unspecified: Secondary | ICD-10-CM | POA: Diagnosis present

## 2020-06-21 DIAGNOSIS — D696 Thrombocytopenia, unspecified: Secondary | ICD-10-CM

## 2020-06-21 LAB — GLUCOSE, CAPILLARY
Glucose-Capillary: 156 mg/dL — ABNORMAL HIGH (ref 70–99)
Glucose-Capillary: 195 mg/dL — ABNORMAL HIGH (ref 70–99)
Glucose-Capillary: 148 mg/dL — ABNORMAL HIGH (ref 70–99)
Glucose-Capillary: 176 mg/dL — ABNORMAL HIGH (ref 70–99)

## 2020-06-21 MED ORDER — NICOTINE 21 MG/24HR TD PT24: 21.0000 mg | MEDICATED_PATCH | Freq: Every day | TRANSDERMAL | 0 refills | Status: DC

## 2020-06-21 MED ORDER — VITAMIN D 25 MCG (1000 UNIT) PO TABS
1000.0000 [IU] | ORAL_TABLET | Freq: Every day | ORAL | Status: DC
  Administered 2020-06-22 – 2020-07-04 (×13): 1000 [IU] via ORAL
  Filled 2020-06-21 (×13): qty 1

## 2020-06-21 MED ORDER — CLOPIDOGREL BISULFATE 75 MG PO TABS
75.0000 mg | ORAL_TABLET | Freq: Every day | ORAL | Status: DC
  Administered 2020-06-22 – 2020-07-04 (×13): 75 mg via ORAL
  Filled 2020-06-21 (×13): qty 1

## 2020-06-21 MED ORDER — LIVING WELL WITH DIABETES BOOK
Freq: Once | Status: AC
  Filled 2020-06-21: qty 1

## 2020-06-21 MED ORDER — EXERCISE FOR HEART AND HEALTH BOOK
Freq: Once | Status: AC
  Filled 2020-06-21: qty 1

## 2020-06-21 MED ORDER — INSULIN GLARGINE 100 UNIT/ML ~~LOC~~ SOLN
12.0000 [IU] | Freq: Every day | SUBCUTANEOUS | Status: DC
  Administered 2020-06-21 – 2020-07-03 (×13): 12 [IU] via SUBCUTANEOUS
  Filled 2020-06-21 (×14): qty 0.12

## 2020-06-21 MED ORDER — ASPIRIN 81 MG PO TBEC: 81.0000 mg | DELAYED_RELEASE_TABLET | Freq: Every day | ORAL | 0 refills | Status: DC

## 2020-06-21 MED ORDER — ASPIRIN EC 81 MG PO TBEC
81.0000 mg | DELAYED_RELEASE_TABLET | Freq: Every day | ORAL | Status: DC
  Administered 2020-06-22 – 2020-07-04 (×13): 81 mg via ORAL
  Filled 2020-06-21 (×13): qty 1

## 2020-06-21 MED ORDER — NICOTINE POLACRILEX 2 MG MT GUM
2.0000 mg | CHEWING_GUM | OROMUCOSAL | Status: DC | PRN
  Administered 2020-06-21: 2 mg via ORAL
  Filled 2020-06-21 (×2): qty 1

## 2020-06-21 MED ORDER — BLOOD PRESSURE CONTROL BOOK
Freq: Once | Status: AC
  Filled 2020-06-21: qty 1

## 2020-06-21 MED ORDER — ENOXAPARIN SODIUM 40 MG/0.4ML IJ SOSY
40.0000 mg | PREFILLED_SYRINGE | INTRAMUSCULAR | Status: DC
  Administered 2020-06-21 – 2020-07-03 (×13): 40 mg via SUBCUTANEOUS
  Filled 2020-06-21 (×13): qty 0.4

## 2020-06-21 MED ORDER — LISINOPRIL 20 MG PO TABS
40.0000 mg | ORAL_TABLET | Freq: Every day | ORAL | Status: DC
  Administered 2020-06-22 – 2020-07-04 (×13): 40 mg via ORAL
  Filled 2020-06-21 (×14): qty 2

## 2020-06-21 MED ORDER — AMLODIPINE BESYLATE 5 MG PO TABS
5.0000 mg | ORAL_TABLET | Freq: Every day | ORAL | Status: DC
  Administered 2020-06-21: 5 mg via ORAL
  Filled 2020-06-21: qty 1

## 2020-06-21 MED ORDER — NICOTINE POLACRILEX 2 MG MT GUM
2.0000 mg | CHEWING_GUM | OROMUCOSAL | Status: DC | PRN
  Filled 2020-06-21: qty 1

## 2020-06-21 MED ORDER — DAPAGLIFLOZIN PROPANEDIOL 10 MG PO TABS
10.0000 mg | ORAL_TABLET | Freq: Every day | ORAL | Status: DC
  Administered 2020-06-22 – 2020-07-04 (×13): 10 mg via ORAL
  Filled 2020-06-21 (×13): qty 1

## 2020-06-21 MED ORDER — ACETAMINOPHEN 325 MG PO TABS
650.0000 mg | ORAL_TABLET | Freq: Four times a day (QID) | ORAL | Status: DC | PRN
  Administered 2020-06-22: 650 mg via ORAL
  Filled 2020-06-21: qty 2

## 2020-06-21 MED ORDER — INSULIN GLARGINE 100 UNIT/ML ~~LOC~~ SOLN: 12.0000 [IU] | Freq: Every day | SUBCUTANEOUS | 11 refills | Status: DC

## 2020-06-21 MED ORDER — NICOTINE 21 MG/24HR TD PT24
21.0000 mg | MEDICATED_PATCH | Freq: Every day | TRANSDERMAL | Status: DC
  Administered 2020-06-21: 21 mg via TRANSDERMAL
  Filled 2020-06-21: qty 1

## 2020-06-21 MED ORDER — METOPROLOL SUCCINATE ER 50 MG PO TB24
75.0000 mg | ORAL_TABLET | Freq: Every day | ORAL | Status: DC
  Administered 2020-06-22 – 2020-07-04 (×13): 75 mg via ORAL
  Filled 2020-06-21 (×13): qty 1

## 2020-06-21 MED ORDER — METOPROLOL SUCCINATE ER 25 MG PO TB24: 75.0000 mg | ORAL_TABLET | Freq: Every day | ORAL | 0 refills | Status: DC

## 2020-06-21 MED ORDER — ENOXAPARIN SODIUM 40 MG/0.4ML IJ SOSY: 40.0000 mg | PREFILLED_SYRINGE | INTRAMUSCULAR | Status: DC

## 2020-06-21 MED ORDER — SENNOSIDES-DOCUSATE SODIUM 8.6-50 MG PO TABS: 1.0000 | ORAL_TABLET | Freq: Every evening | ORAL | Status: DC | PRN

## 2020-06-21 MED ORDER — ATORVASTATIN CALCIUM 40 MG PO TABS
40.0000 mg | ORAL_TABLET | Freq: Every day | ORAL | Status: DC
  Administered 2020-06-21 – 2020-07-03 (×13): 40 mg via ORAL
  Filled 2020-06-21 (×13): qty 1

## 2020-06-21 MED ORDER — AMLODIPINE BESYLATE 5 MG PO TABS
5.0000 mg | ORAL_TABLET | Freq: Every day | ORAL | Status: DC
  Administered 2020-06-22 – 2020-07-04 (×13): 5 mg via ORAL
  Filled 2020-06-21 (×13): qty 1

## 2020-06-21 MED ORDER — CLOPIDOGREL BISULFATE 75 MG PO TABS: 75.0000 mg | ORAL_TABLET | Freq: Every day | ORAL | 0 refills | Status: DC

## 2020-06-21 MED ORDER — NICOTINE 21 MG/24HR TD PT24
21.0000 mg | MEDICATED_PATCH | Freq: Every day | TRANSDERMAL | Status: DC
  Administered 2020-06-23 – 2020-07-04 (×12): 21 mg via TRANSDERMAL
  Filled 2020-06-21 (×12): qty 1

## 2020-06-21 MED ORDER — PNEUMOCOCCAL VAC POLYVALENT 25 MCG/0.5ML IJ INJ
0.5000 mL | INJECTION | INTRAMUSCULAR | Status: AC
  Administered 2020-06-22: 0.5 mL via INTRAMUSCULAR
  Filled 2020-06-21: qty 0.5

## 2020-06-21 MED ORDER — NICOTINE POLACRILEX 2 MG MT GUM: 2.0000 mg | CHEWING_GUM | OROMUCOSAL | 0 refills | Status: DC | PRN

## 2020-06-21 MED ORDER — INSULIN ASPART 100 UNIT/ML IJ SOLN
0.0000 [IU] | Freq: Three times a day (TID) | INTRAMUSCULAR | Status: DC
  Administered 2020-06-21: 2 [IU] via SUBCUTANEOUS
  Administered 2020-06-22: 3 [IU] via SUBCUTANEOUS
  Administered 2020-06-22 (×2): 2 [IU] via SUBCUTANEOUS
  Administered 2020-06-23: 5 [IU] via SUBCUTANEOUS
  Administered 2020-06-23: 3 [IU] via SUBCUTANEOUS
  Administered 2020-06-23: 2 [IU] via SUBCUTANEOUS
  Administered 2020-06-24: 3 [IU] via SUBCUTANEOUS
  Administered 2020-06-24 – 2020-06-25 (×3): 2 [IU] via SUBCUTANEOUS
  Administered 2020-06-25: 3 [IU] via SUBCUTANEOUS
  Administered 2020-06-25: 2 [IU] via SUBCUTANEOUS
  Administered 2020-06-26 (×2): 3 [IU] via SUBCUTANEOUS
  Administered 2020-06-26 – 2020-06-27 (×3): 2 [IU] via SUBCUTANEOUS
  Administered 2020-06-27: 3 [IU] via SUBCUTANEOUS
  Administered 2020-06-28 – 2020-06-30 (×6): 2 [IU] via SUBCUTANEOUS
  Administered 2020-07-01 (×2): 3 [IU] via SUBCUTANEOUS
  Administered 2020-07-01 – 2020-07-02 (×2): 2 [IU] via SUBCUTANEOUS
  Administered 2020-07-02: 5 [IU] via SUBCUTANEOUS
  Administered 2020-07-02: 2 [IU] via SUBCUTANEOUS
  Administered 2020-07-03: 3 [IU] via SUBCUTANEOUS
  Administered 2020-07-03 (×2): 2 [IU] via SUBCUTANEOUS

## 2020-06-21 MED ORDER — ACETAMINOPHEN 650 MG RE SUPP: 650.0000 mg | Freq: Four times a day (QID) | RECTAL | Status: DC | PRN

## 2020-06-21 MED ORDER — DAPAGLIFLOZIN PROPANEDIOL 10 MG PO TABS
10.0000 mg | ORAL_TABLET | Freq: Every day | ORAL | Status: DC
  Administered 2020-06-21: 10 mg via ORAL
  Filled 2020-06-21: qty 1

## 2020-06-21 MED ORDER — AMLODIPINE BESYLATE 5 MG PO TABS: 5.0000 mg | ORAL_TABLET | Freq: Every day | ORAL | 0 refills | Status: DC

## 2020-06-21 NOTE — Progress Notes (Signed)
Patient ID: Frank Luna, male   DOB: [birth year], 60 y.o.   MRN: [id number] Met with the patient to introduce self and the role of the nurse CM and initiate education on secondary risks including HTN, DM (A1C 8.3) and HLD , smoking.  DAPT x 3 weeks then ASA solo. Patient noted he had been eating primarily fried foods and processed foods with salt and carbs and decreased his activity level since the past stroke as he had lost his job.  Reviewed Vit D supplement and decreased albumin. Patient given handouts on information reviewed including DASH diet, cooking with less salt, smoking cessation tips and high protein food choices. Continue to follow along to discharge to address educational needs and collaborate with the SW to facilitate prep for discharge. [name]

## 2020-06-21 NOTE — Progress Notes (Signed)
Inpatient Rehabilitation Medication Review by a Pharmacist  A complete drug regimen review was completed for this patient to identify any potential clinically significant medication issues.  Clinically significant medication issues were identified:  no  Check AMION for pharmacist assigned to patient if future medication questions/issues arise during this admission.  Pharmacist comments: Reordered nicotine gum prn and nicotine patch to start 5/6  Time spent performing this drug regimen review (minutes):  10 minutes   Elwin Sleight 06/21/2020 1:25 PM

## 2020-06-21 NOTE — H&P (Signed)
Physical Medicine and Rehabilitation Admission H&P    Chief Complaint  Patient presents with  . Cerebrovascular Accident  : HPI: Frank Luna is a 60 year old right-handed male with history of diabetes mellitus, hypertension, tobacco abuse, thrombocytopenia, prior stroke 2017 with minimal residual weakness maintained on aspirin and Plavix.  History taken from chart review and patient.  Patient lives with spouse.  1 level home 5 steps to entry.  Independent prior to admission.  He presented on 06/17/2020 with acute left hemiparesis and dysarthria.  Initially presented to [facility] after CT unremarkable for acute intracranial process.  Patient left AMA returning with persistent symptoms.  MRI showed a 1 cm acute infarction in the right corona radiata white matter.  Extensive chronic small vessel ischemic changes elsewhere throughout the brain as well as incidental findings of bilateral mastoid effusions.  CT angiogram of the head showed no emergent large vessel occlusion.  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  Admission chemistries unremarkable except glucose 126, alcohol negative, hemoglobin A1c 8.3 urine drug screen negative.  Echocardiogram ejection fraction 60-65%.  No wall motion abnormalities.  Presently maintained on aspirin 81 mg daily and Plavix 75 mg daily for CVA prophylaxis x3 weeks then aspirin alone.  Subcutaneous Lovenox for DVT prophylaxis.  Tolerating a regular consistency diet.  Due to patient's left hemiparesis and slurred speech he was admitted for a comprehensive rehab program.  Please see preadmission assessment from earlier today as well.  Review of Systems  Constitutional: Negative for chills and fever.  HENT: Negative for hearing loss.   Eyes: Negative for blurred vision and double vision.  Respiratory: Negative for cough and shortness of breath.   Cardiovascular: Negative for chest pain and  leg swelling.  Gastrointestinal: Positive for constipation. Negative for heartburn and nausea.  Genitourinary: Negative for dysuria, flank pain and hematuria.  Skin: Negative for rash.  Neurological: Positive for speech change, focal weakness and weakness.  All other systems reviewed and are negative.  Past Medical History:  Diagnosis Date  . Diabetes (HCC)   . Hypertension   . Stroke (HCC)   . Thrombocytopenia (HCC)    History reviewed. No pertinent surgical history. Family History  Problem Relation Age of Onset  . Stroke Paternal Great-grandfather    Social History:  reports that he has been smoking cigarettes. He has been smoking about 1.00 pack per day. He has never used smokeless tobacco. He reports that he does not drink alcohol and does not use drugs. Allergies:  Allergies  Allergen Reactions  . Penicillins     Unknown reaction   Medications Prior to Admission  Medication Sig Dispense Refill  . acetaminophen (TYLENOL) 500 MG tablet Take 1,000 mg by mouth every 6 (six) hours as needed for headache (pain).    Marland Kitchen aspirin EC 325 MG EC tablet Take 1 tablet (325 mg total) by mouth daily. 90 tablet 3  . atorvastatin (LIPITOR) 40 MG tablet Take 1 tablet (40 mg total) by mouth daily at 6 PM. 90 tablet 3  . clopidogrel (PLAVIX) 75 MG tablet Take 1 tablet (75 mg total) by mouth daily. 30 tablet 1  . dapagliflozin propanediol (FARXIGA) 10 MG TABS tablet Take 10 mg by mouth daily.    Marland Kitchen glipiZIDE (GLUCOTROL XL) 10 MG 24 hr tablet Take 10 mg by mouth 2 (two) times daily.    Marland Kitchen LANTUS SOLOSTAR 100 UNIT/ML Solostar Pen Inject 15 Units into the  skin in the morning and at bedtime.    Marland Kitchen lisinopril (ZESTRIL) 40 MG tablet Take 40 mg by mouth daily.    . metoprolol succinate (TOPROL-XL) 50 MG 24 hr tablet Take 1 tablet (50 mg total) by mouth daily. Take with or immediately following a meal. (Patient taking differently: Take 75 mg by mouth daily. Take with or immediately following a meal.) 90 tablet  3    Drug Regimen Review Drug regimen was reviewed and remains appropriate with no significant issues identified  Home: Home Living Family/patient expects to be discharged to:: Private residence Living Arrangements: Spouse/significant other Available Help at Discharge: Family Type of Home: House Home Access: Stairs to enter Secretary/administrator of Steps: 5 Entrance Stairs-Rails: Can reach both Home Layout: One level Bathroom Shower/Tub: Health visitor: Handicapped height Home Equipment: Information systems manager  Lives With: Spouse   Functional History: Prior Function Level of Independence: Independent Comments: Pt was driving. Reports he was on disability.  Functional Status:  Mobility: Bed Mobility Overal bed mobility: Needs Assistance Bed Mobility: Supine to Sit Supine to sit: Min assist General bed mobility comments: OOb in recliner and returned to recliner at end of session Transfers Overall transfer level: Needs assistance Equipment used: 2 person hand held assist Transfers: Sit to/from Stand Sit to Stand: Mod assist,+2 physical assistance Stand pivot transfers: Min assist,+2 physical assistance,+2 safety/equipment General transfer comment: mod +2 for rise, steady, and correcting postural bias to L. STS x3, from recliner x1 and room chair x2, cues needed to shift weight to L during ADLs and Wb thro LUE at sink Ambulation/Gait Ambulation/Gait assistance: Mod assist,+2 physical assistance,+2 safety/equipment Gait Distance (Feet): 15 Feet (+10) Assistive device: 2 person hand held assist Gait Pattern/deviations: Decreased step length - left,Decreased stance time - left,Decreased stride length,Narrow base of support,Step-through pattern,Drifts right/left,Trunk flexed General Gait Details: Mod +2 for steadying, progressing LLE during swing phase and guarding L knee during stance (at times goes into genu recurvatum so requires posterior knee unlocking to prevent  hyperextension). Verbal cuing for midline posture, sequencing/weight-shifting. Seated rest after first bout of gait and 3 minutes of standing at sink for ADL tasks. Gait velocity: decr Gait velocity interpretation: <1.31 ft/sec, indicative of household ambulator    ADL: ADL Overall ADL's : Needs assistance/impaired Eating/Feeding: Set up,Sitting Eating/Feeding Details (indicate cue type and reason): in recliner Grooming: Oral care,Standing,Moderate assistance Grooming Details (indicate cue type and reason): standing at sink with pt needing MOD A +2 for standing balance and cues for sequencing to use LUE as stabilizer Upper Body Bathing: Moderate assistance,Sitting Upper Body Bathing Details (indicate cue type and reason): in recliner Lower Body Bathing: Moderate assistance Lower Body Bathing Details (indicate cue type and reason): Min A +2 sit<>stand Upper Body Dressing : Moderate assistance,Sitting Upper Body Dressing Details (indicate cue type and reason): to don back side gown, cues needed to use RUE as hand over hand assist to thread LUE through sleeve of gown Lower Body Dressing: Maximal assistance Lower Body Dressing Details (indicate cue type and reason): Min A +2 sit<>stand Toilet Transfer: Maximal assistance,+2 for physical assistance,Ambulation Toilet Transfer Details (indicate cue type and reason): simulated via functional mobiltiy within pts room, MOD A +2 to ambulate from recliner<>sink with pt needing assist at LLE to progress LLE during gait Toileting- Clothing Manipulation and Hygiene: Maximal assistance Toileting - Clothing Manipulation Details (indicate cue type and reason): Min A +2 sit<>stand Functional mobility during ADLs: Maximal assistance,+2 for physical assistance General ADL Comments: pt continues  to present with L sided hemiparesis, impaired balacne, and impaired strength and coordination  Cognition: Cognition Overall Cognitive Status: Impaired/Different from  baseline Arousal/Alertness: Awake/alert Orientation Level: Oriented to person,Oriented to place,Oriented to situation Attention: Focused,Sustained Focused Attention: Appears intact Sustained Attention: Impaired Sustained Attention Impairment: Verbal complex Memory: Impaired Memory Impairment: Retrieval deficit,Decreased recall of new information (Immediate: 4/5; delayed: 0/5; with cues: 0/5) Awareness: Impaired Awareness Impairment: Intellectual impairment Problem Solving: Impaired Problem Solving Impairment: Verbal complex,Verbal basic Executive Function: Sequencing,Organizing Sequencing: Impaired Sequencing Impairment: Verbal complex (Clock drawing: 0/4) Organizing: Impaired Organizing Impairment: Verbal complex (Backward digit span: 0/3) Cognition Arousal/Alertness: Awake/alert Behavior During Therapy: WFL for tasks assessed/performed,Impulsive Overall Cognitive Status: Impaired/Different from baseline Area of Impairment: Memory,Following commands,Safety/judgement,Awareness,Problem solving Orientation Level: Disoriented to,Time (did not know month or year, states he uses his phone normally for that info) Memory: Decreased short-term memory (repeated cues for LUE placement) Following Commands: Follows one step commands consistently,Follows multi-step commands with increased time Safety/Judgement: Decreased awareness of safety,Decreased awareness of deficits Awareness: Intellectual Problem Solving: Decreased initiation,Difficulty sequencing,Requires verbal cues,Requires tactile cues General Comments: Pt initiates mobility as soon as he is cued, at times impulsively. Pt inattentive to L, especially when participating in ADL or mobility tasks. Pt requires frequent cues to correct balance, but after 3-4 cues pt recognizes need to correct balance without PT/COTA input.  Physical Exam: Blood pressure (!) 170/87, pulse 60, temperature 98 F (36.7 C), temperature source Oral, resp. rate  17, SpO2 98 %. Physical Exam Vitals reviewed.  Constitutional:      General: He is not in acute distress.    Appearance: Normal appearance.  HENT:     Head: Normocephalic and atraumatic.     Right Ear: External ear normal.     Left Ear: External ear normal.     Nose: Nose normal.  Eyes:     General:        Right eye: No discharge.        Left eye: No discharge.     Extraocular Movements: Extraocular movements intact.  Cardiovascular:     Rate and Rhythm: Normal rate and regular rhythm.  Pulmonary:     Effort: Pulmonary effort is normal. No respiratory distress.     Breath sounds: No stridor.  Abdominal:     General: Bowel sounds are normal.     Comments: + Distended  Musculoskeletal:     Cervical back: Normal range of motion and neck supple.     Comments: No edema or tenderness in extremities  Skin:    General: Skin is warm and dry.  Neurological:     Mental Status: He is alert.     Comments: Alert and oriented to person place Makes eye contact with examiner.   Follows simple commands.   Fair insight and awareness. Motor: RUE/RLE: 5/5 proximal distal LLE: 5/5 proximal distal LUE: 0/5 proximal distal  Psychiatric:        Mood and Affect: Mood normal.        Behavior: Behavior normal.     Results for orders placed or performed during the hospital encounter of 06/17/20 (from the past 48 hour(s))  Glucose, capillary     Status: Abnormal   Collection Time: 06/19/20 12:24 PM  Result Value Ref Range   Glucose-Capillary 139 (H) 70 - 99 mg/dL    Comment: Glucose reference range applies only to samples taken after fasting for at least 8 hours.   Comment 1 Notify RN    Comment 2 Document in Chart  Glucose, capillary     Status: Abnormal   Collection Time: 06/19/20  4:39 PM  Result Value Ref Range   Glucose-Capillary 128 (H) 70 - 99 mg/dL    Comment: Glucose reference range applies only to samples taken after fasting for at least 8 hours.   Comment 1 Notify RN     Comment 2 Document in Chart   Glucose, capillary     Status: Abnormal   Collection Time: 06/19/20  7:33 PM  Result Value Ref Range   Glucose-Capillary 248 (H) 70 - 99 mg/dL    Comment: Glucose reference range applies only to samples taken after fasting for at least 8 hours.  Glucose, capillary     Status: Abnormal   Collection Time: 06/19/20  9:42 PM  Result Value Ref Range   Glucose-Capillary 163 (H) 70 - 99 mg/dL    Comment: Glucose reference range applies only to samples taken after fasting for at least 8 hours.  CBC     Status: Abnormal   Collection Time: 06/20/20  4:58 AM  Result Value Ref Range   WBC 6.1 4.0 - 10.5 K/uL   RBC 5.63 4.22 - 5.81 MIL/uL   Hemoglobin 16.7 13.0 - 17.0 g/dL   HCT 40.949.3 81.139.0 - 91.452.0 %   MCV 87.6 80.0 - 100.0 fL   MCH 29.7 26.0 - 34.0 pg   MCHC 33.9 30.0 - 36.0 g/dL   RDW 78.212.8 95.611.5 - 21.315.5 %   Platelets 82 (L) 150 - 400 K/uL    Comment: Immature Platelet Fraction may be clinically indicated, consider ordering this additional test [id number] CONSISTENT WITH PREVIOUS RESULT REPEATED TO VERIFY    nRBC 0.0 0.0 - 0.2 %    Comment: Performed at [facility], [address] [place], [place] [id number]  Glucose, capillary     Status: Abnormal   Collection Time: 06/20/20  6:41 AM  Result Value Ref Range   Glucose-Capillary 161 (H) 70 - 99 mg/dL    Comment: Glucose reference range applies only to samples taken after fasting for at least 8 hours.  Glucose, capillary     Status: Abnormal   Collection Time: 06/20/20  9:49 PM  Result Value Ref Range   Glucose-Capillary 132 (H) 70 - 99 mg/dL    Comment: Glucose reference range applies only to samples taken after fasting for at least 8 hours.   Comment 1 Notify RN    Comment 2 Document in Chart   Glucose, capillary     Status: Abnormal   Collection Time: 06/21/20  6:11 AM  Result Value Ref Range   Glucose-Capillary 156 (H) 70 - 99 mg/dL    Comment: Glucose reference range applies only to samples taken  after fasting for at least 8 hours.   Comment 1 Notify RN    Comment 2 Document in Chart    No results found.     Medical Problem List and Plan: 1.  Left-sided hemiparesis and slurred speech secondary to right corona radiata infarction likely secondary to small vessel disease as well as history of prior stroke 2017 with minimal residual weakness  -patient may shower  -ELOS/Goals: Supervision/min a/12-16 days.  Admit to CIR 2.  Antithrombotics: -DVT/anticoagulation: Lovenox  -antiplatelet therapy: Aspirin 81 mg daily and Plavix 75 mg daily for 3 weeks total then aspirin alone 3. Pain Management: Tylenol as needed 4. Mood: Provide emotional support  -antipsychotic agents: N/A 5. Neuropsych: This patient is capable of making decisions on his own behalf.  6. Skin/Wound Care: Routine skin checks 7. Fluids/Electrolytes/Nutrition: Routine in and outs.    CMP ordered for tomorrow 8.  Hypertension.  Lisinopril 40 mg daily, Toprol-XL 75 mg daily.    Monitor with increased mobility. 9.  Hyperlipidemia: Lipitor 10.  Diabetes mellitus with hyperglycemia.  Hemoglobin A1c 8.3.  SSI.  Presently on Lantus insulin 12 units nightly.  Patient also on Glucotrol 10 mg twice daily as well as farxiga 10 mg daily prior to admission.  Resume as needed  Monitor his increase mobility 11.  History of tobacco use.  Provide counseling 12.  History of thrombocytopenia.    Platelets 82 on 5/3, CBC ordered for tomorrow  Charlton Amor, PA-C 06/21/2020   I have personally performed a face to face diagnostic evaluation, including, but not limited to relevant history and physical exam findings, of this patient and developed relevant assessment and plan.  Additionally, I have reviewed and concur with the physician assistant's documentation above.  Maryla Morrow, MD, ABPMR

## 2020-06-21 NOTE — Progress Notes (Signed)
Inpatient Rehab Admissions Coordinator:    I have insurance approval and a bed available for pt to admit to CIR today. Awaiting for final confirmation from IMTS team.  Will let pt/family and TOC team know.   Estill Dooms, PT, DPT Admissions Coordinator [phone] 06/21/20  10:07 AM

## 2020-06-21 NOTE — Discharge Summary (Signed)
Name: Frank Luna MRN: [id number] DOB: 1960/12/14 60 y.o. PCP: [name], Yessica Greasy  Date of Admission: 06/17/2020 12:46 PM Date of Discharge:  06/21/2020 Attending Physician: Gust RungHoffman, Erik C, DO   Discharge Diagnosis: Acute ischemic strokeof the right corona radiata Type 2 DM HTN H/o Stroke H/O TIA Nicotine dependence  Discharge Medications: Allergies as of 06/21/2020      Reactions   Penicillins    Unknown reaction      Medication List    STOP taking these medications   glipiZIDE 10 MG 24 hr tablet Commonly known as: GLUCOTROL XL   Lantus SoloStar 100 UNIT/ML Solostar Pen Generic drug: insulin glargine Replaced by: insulin glargine 100 UNIT/ML injection     TAKE these medications   acetaminophen 500 MG tablet Commonly known as: TYLENOL Take 1,000 mg by mouth every 6 (six) hours as needed for headache (pain).   amLODipine 5 MG tablet Commonly known as: NORVASC Take 1 tablet (5 mg total) by mouth daily. Start taking on: Jun 22, 2020   aspirin 81 MG EC tablet Take 1 tablet (81 mg total) by mouth daily. Swallow whole. What changed:   medication strength  how much to take  additional instructions   atorvastatin 40 MG tablet Commonly known as: LIPITOR Take 1 tablet (40 mg total) by mouth daily at 6 PM.   clopidogrel 75 MG tablet Commonly known as: PLAVIX Take 1 tablet (75 mg total) by mouth daily for 17 days.   Farxiga 10 MG Tabs tablet Generic drug: dapagliflozin propanediol Take 10 mg by mouth daily.   insulin glargine 100 UNIT/ML injection Commonly known as: LANTUS Inject 0.12 mLs (12 Units total) into the skin at bedtime. Replaces: Lantus SoloStar 100 UNIT/ML Solostar Pen   lisinopril 40 MG tablet Commonly known as: ZESTRIL Take 40 mg by mouth daily.   metoprolol succinate 25 MG 24 hr tablet Commonly known as: TOPROL-XL Take 3 tablets (75 mg total) by mouth daily. Take with or immediately following a meal. What changed:   medication  strength  how much to take   nicotine 21 mg/24hr patch Commonly known as: NICODERM CQ - dosed in mg/24 hours Place 1 patch (21 mg total) onto the skin daily.   nicotine polacrilex 2 MG gum Commonly known as: NICORETTE Take 1 each (2 mg total) by mouth as needed for smoking cessation.       Disposition and follow-up:   [name].  At the hospital follow up visit please address:  1.  Follow up: -- Follow up with PCP for COPD evaluation, PFTs, Smoking cessation and Vitamin D deficiency F/U. -- [facility]- Schedule an appointment as soon as possible in 4 weeks.   2.  Labs / imaging needed at time of follow-up: Vitamin D level in 3 months, PFT's for COPD evaluation.   3.  Pending labs/ test needing follow-up: No  Follow-up Appointments:  Follow-up Information    [facility]. Schedule an appointment as soon as possible for a visit in 4 week(s).   Specialty: Neurology Contact information: [address] [place] [id number] [phone]              Hospital Course by problem list: Acute strokeof the right corona radiata Pt presented with Left sided weakness. CT head negative for acute bleed. Neurology consulted. tPA was not given as outside window. MRI demonstrated 1 cm acute  infarction in the corona radiata white matter on the right. Also extensive chronic small vessel disease elsewhere.CTA demonstrated moderate stenosis of right vertebral artery, mild posterior circulation atherosclerosis, bilateral carotid atherosclerosis but no hemodynamically significant stenosis. ECHO revealed LVEF 60 to 65%,  bubble study negative. Pt was started on DAPT (Aspirin 81 mg and Plavix 75 mg) recommended for Permissive hypertension. Pt continued on Home Atorvastatin for secondary stroke prevention. PT/OT recommended admission to CIR.   Neurology recommended aspirin 81 mg and Plavix 75 mg for 3 weeks and then ASA alone.  High Fall risk  Pt had 2 falls back to back on 06/17/20 evening when he was trying to sit on chair. He hit his head and left elbow. X ray left Elbow and CT head done revealed no fracture or acute hemorrhage. Pt was put on Fall precautions. No more falls reported.   Type 2 diabetes mellitus on Insulin A1c 8.3 at admission. Pt was on farxiga 10 mg daily, Glucotrol XL 10 mg 24 hr tablet BID and Lantus 15 mg BID prior to admission. Pt was put on Lantus 12 units nightly during this admission but later on Farxiga 10 mg daily was added.   Hypertension Pt BP was elevated at admission at 178/98 mmHg. Permissive Hypertension was maintained initially as per Neurology. Pt was later started on Home Lisinopril 40 mg daily and Metoprolol succinate 75 mg daily. Amlodipine 5 mg daily was added due to consistent high BP.   Vitamin D deficiency Vitamin D level was low at 14.5. Pt was started on vitamin D 1000 units daily. Recommend Rechecking level in about 3 months.  Smoking Patient counseled on quiting. Willing to quit smoking.  Has tried to quit before.  Has nicotine patches at home. Pt was started on Nicotine patches and gum before discharge to rehab facility.  No formal diagnosis of COPD but sometimes feel short of breath.  No wheezing on exam. Will need COPD evaluation outpatient with PFT's.  Subjective on day of discharge: Pt still c/o weakness in Left hand. Pt c/o nicotine craving. States he wants to go out and smoke. Pt was offered Nicotine patch and gum. He agreed with the plan of staying and going to rehab to improve his strength.  Discharge Exam:   BP (!) 170/87 (BP Location: Right Arm)   Pulse 60   Temp 98 F (36.7 C) (Oral)   Resp 17   SpO2 98%  Discharge exam: Physical Exam Constitutional:      General: He is not in acute distress.    Appearance: Normal appearance. He is not ill-appearing,  toxic-appearing or diaphoretic.  HENT:     Head: Normocephalic and atraumatic.  Cardiovascular:     Rate and Rhythm: Normal rate and regular rhythm.  Pulmonary:     Effort: Pulmonary effort is normal.     Breath sounds: Normal breath sounds.  Abdominal:     Palpations: Abdomen is soft.  Skin:    General: Skin is warm and dry.  Neurological:     General: No focal deficit present.     Mental Status: He is alert and oriented to person, place, and time.     Cranial Nerves: No cranial nerve deficit.     Sensory: No sensory deficit.     Motor: Weakness present.  Psychiatric:        Mood and Affect: Mood normal.        Behavior: Behavior normal.   Neuro - alert, Cranial nerves intact no aphasia Extraocular  movements intact, saccadic eye movements Left upper extremity strength 3 out of 5. Able to lift Left hand with effort. Weak hand grip.    Left lower extremity strength 4 out of 5 Sensation intact throughout No dysmetria  Pertinent Labs, Studies, and Procedures:  DG Elbow 2 Views Left  Result Date: 06/17/2020 CLINICAL DATA:  Fall, elbow stiffness. EXAM: LEFT ELBOW - 2 VIEW COMPARISON:  None. FINDINGS: There is no evidence of fracture, dislocation, or joint effusion. There is no evidence of significant arthropathy or other focal bone abnormality. Soft tissues are unremarkable. IMPRESSION: Negative. Electronically Signed   By: Bary Richard M.D.   On: 06/17/2020 19:52   CT HEAD WO CONTRAST  Result Date: 06/17/2020 CLINICAL DATA:  Follow-up stroke.  Fall from bed with head trauma. EXAM: CT HEAD WITHOUT CONTRAST TECHNIQUE: Contiguous axial images were obtained from the base of the skull through the vertex without intravenous contrast. COMPARISON:  Head CT dated 06/17/2020.  Brain MRI dated 06/17/2020. FINDINGS: Brain: Generalized parenchymal volume loss with commensurate dilatation of the ventricles and sulci. No evidence of hydrocephalus. Chronic small vessel ischemic changes are seen  within the bilateral periventricular and subcortical white matter regions. Small old lacunar infarcts are seen within the bilateral basal ganglia regions. No mass, hemorrhage, edema or other evidence of acute parenchymal abnormality. No extra-axial hemorrhage. Vascular: Chronic calcified atherosclerotic changes of the large vessels at the skull base. No unexpected hyperdense vessel. Skull: Normal. Negative for fracture or focal lesion. Sinuses/Orbits: No acute finding. Other: None. IMPRESSION: 1. No acute findings. No intracranial mass, hemorrhage or edema. No skull fracture. 2. Chronic small vessel ischemic changes in the white matter and basal ganglia regions. Electronically Signed   By: Bary Richard M.D.   On: 06/17/2020 19:56   CT HEAD WO CONTRAST  Result Date: 06/17/2020 CLINICAL DATA:  Left-sided arm and leg weakness. EXAM: CT HEAD WITHOUT CONTRAST TECHNIQUE: Contiguous axial images were obtained from the base of the skull through the vertex without intravenous contrast. COMPARISON:  February 04, 2016 FINDINGS: Brain: There is mild cerebral atrophy with widening of the extra-axial spaces and ventricular dilatation. There are areas of decreased attenuation within the white matter tracts of the supratentorial brain, consistent with microvascular disease changes. A chronic left basal ganglia lacunar infarct is seen. Vascular: No hyperdense vessel or unexpected calcification. Skull: Normal. Negative for fracture or focal lesion. Sinuses/Orbits: There is mild right maxillary sinus mucosal thickening. Other: None. IMPRESSION: 1. Generalized cerebral atrophy. 2. No acute intracranial abnormality. Electronically Signed   By: Aram Candela M.D.   On: 06/17/2020 04:29   MR BRAIN WO CONTRAST  Result Date: 06/17/2020 CLINICAL DATA:  Left-sided arm and leg weakness. EXAM: MRI HEAD WITHOUT CONTRAST TECHNIQUE: Multiplanar, multiecho pulse sequences of the brain and surrounding structures were obtained without  intravenous contrast. COMPARISON:  Negative acute CT evaluations for 202021 and earlier today. FINDINGS: Brain: Study suffers from considerable motion degradation. There is a 1 cm acute infarction within the white matter of the corona radiography on the right. No other acute infarction. Extensive chronic small-vessel ischemic changes affect the pons. No focal cerebellar infarction. Old small vessel infarctions are present affecting the thalami, basal ganglia and hemispheric white matter. No large vessel territory infarction. No mass, hemorrhage, hydrocephalus or extra-axial collection. Vascular: Major vessels at the base of the brain show flow. Skull and upper cervical spine: Negative Sinuses/Orbits: Clear/normal Other: Bilateral mastoid effusions. IMPRESSION: 1 cm acute infarction in the corona radiata white matter on the  right. Extensive chronic small-vessel ischemic changes elsewhere throughout brain as outlined above. Bilateral mastoid effusions. Electronically Signed   By: Paulina Fusi M.D.   On: 06/17/2020 16:12   ECHOCARDIOGRAM COMPLETE BUBBLE STUDY  Result Date: 06/18/2020    ECHOCARDIOGRAM REPORT   Patient Name:   NILAN IDDINGS Date of Exam: 06/18/2020 Medical Rec #:  [id number]    Height:       69.0 in Accession #:    [id number]   Weight:       189.6 lb Date of Birth:  02/28/1960    BSA:          2.020 m Patient Age:    59 years     BP:           167/79 mmHg Patient Gender: M            HR:           77 bpm. Exam Location:  Inpatient Procedure: 2D Echo, Cardiac Doppler, Color Doppler and Saline Contrast Bubble            Study Indications:    CVA  History:        Patient has prior history of Echocardiogram examinations, most                 recent 02/05/2016. Stroke; Risk Factors:Hypertension, Diabetes,                 Current Smoker and Obesity.  Sonographer:    Lavenia Atlas Referring Phys: [id number] [facility]  Sonographer Comments: Patient is morbidly obese and suboptimal apical window. Image  acquisition challenging due to patient body habitus. IMPRESSIONS  1. Left ventricular ejection fraction, by estimation, is 60 to 65%. The left ventricle has normal function. The left ventricle has no regional wall motion abnormalities. There is mild concentric left ventricular hypertrophy. Left ventricular diastolic parameters were normal.  2. Right ventricular systolic function is normal. The right ventricular size is normal. Tricuspid regurgitation signal is inadequate for assessing PA pressure.  3. The mitral valve is normal in structure. No evidence of mitral valve regurgitation. No evidence of mitral stenosis.  4. The aortic valve is tricuspid. Aortic valve regurgitation is not visualized. Mild aortic valve sclerosis is present, with no evidence of aortic valve stenosis.  5. The inferior vena cava is normal in size with <50% respiratory variability, suggesting right atrial pressure of 8 mmHg.  6. Agitated saline contrast bubble study was negative, with no evidence of any interatrial shunt. FINDINGS  Left Ventricle: Left ventricular ejection fraction, by estimation, is 60 to 65%. The left ventricle has normal function. The left ventricle has no regional wall motion abnormalities. The left ventricular internal cavity size was normal in size. There is  mild concentric left ventricular hypertrophy. Left ventricular diastolic parameters were normal. Normal left ventricular filling pressure. Right Ventricle: The right ventricular size is normal. No increase in right ventricular wall thickness. Right ventricular systolic function is normal. Tricuspid regurgitation signal is inadequate for assessing PA pressure. Left Atrium: Left atrial size was normal in size. Right Atrium: Right atrial size was normal in size. Pericardium: There is no evidence of pericardial effusion. Mitral Valve: The mitral valve is normal in structure. No evidence of mitral valve regurgitation. No evidence of mitral valve stenosis. Tricuspid  Valve: The tricuspid valve is normal in structure. Tricuspid valve regurgitation is not demonstrated. No evidence of tricuspid stenosis. Aortic Valve: The aortic valve is tricuspid. Aortic valve regurgitation is not visualized.  Mild aortic valve sclerosis is present, with no evidence of aortic valve stenosis. Pulmonic Valve: The pulmonic valve was normal in structure. Pulmonic valve regurgitation is not visualized. No evidence of pulmonic stenosis. Aorta: The aortic root is normal in size and structure. Venous: The inferior vena cava is normal in size with less than 50% respiratory variability, suggesting right atrial pressure of 8 mmHg. IAS/Shunts: No atrial level shunt detected by color flow Doppler. Agitated saline contrast was given intravenously to evaluate for intracardiac shunting. Agitated saline contrast bubble study was negative, with no evidence of any interatrial shunt.  LEFT VENTRICLE PLAX 2D LVIDd:         4.90 cm  Diastology LVIDs:         3.00 cm  LV e' medial:    7.62 cm/s LV PW:         1.40 cm  LV E/e' medial:  9.6 LV IVS:        1.40 cm  LV e' lateral:   8.59 cm/s LVOT diam:     2.10 cm  LV E/e' lateral: 8.5 LV SV:         69 LV SV Index:   34 LVOT Area:     3.46 cm  RIGHT VENTRICLE RV Basal diam:  3.50 cm RV S prime:     12.30 cm/s TAPSE (M-mode): 2.4 cm LEFT ATRIUM             Index       RIGHT ATRIUM           Index LA diam:        4.00 cm 1.98 cm/m  RA Area:     12.80 cm LA Vol (A2C):   48.6 ml 24.06 ml/m RA Volume:   28.70 ml  14.21 ml/m LA Vol (A4C):   34.8 ml 17.23 ml/m LA Biplane Vol: 41.4 ml 20.50 ml/m  AORTIC VALVE LVOT Vmax:   108.00 cm/s LVOT Vmean:  70.200 cm/s LVOT VTI:    0.199 m  AORTA Ao Root diam: 3.30 cm MITRAL VALVE MV Area (PHT): 3.99 cm    SHUNTS MV Decel Time: 190 msec    Systemic VTI:  0.20 m MV E velocity: 73.30 cm/s  Systemic Diam: 2.10 cm MV A velocity: 61.70 cm/s MV E/A ratio:  1.19 Armanda Magic MD Electronically signed by Armanda Magic MD Signature Date/Time:  06/18/2020/1:05:44 PM    Final    CT ANGIO HEAD NECK W WO CM W PERF  Result Date: 06/17/2020 CLINICAL DATA:  60 year old male with left side numbness and weakness upon waking. 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 EXAM: CT ANGIOGRAPHY HEAD AND NECK TECHNIQUE: Multidetector CT imaging of the head and neck was performed using the standard protocol during bolus administration of intravenous contrast. Multiplanar CT image reconstructions and MIPs were obtained to evaluate the vascular anatomy. Carotid stenosis measurements (when applicable) are obtained utilizing NASCET criteria, using the distal internal carotid diameter as the denominator. CONTRAST:  OMNIPAQUE IOHEXOL 350 MG/ML SOLN COMPARISON:  CTA head and neck and CT brain perfusion 06/08/2019. CTA head and neck 02/04/2016. FINDINGS: CTA NECK Skeleton: Carious dentition. Progressive left middle ear opacification since last year. Chronic mastoid effusions greater on the left. No acute osseous abnormality identified. Upper chest: Stable upper lungs, mild pulmonary scarring. No superior mediastinal lymphadenopathy. Other neck: Partially effaced pharynx similar to last year. Chronic adenoid soft tissue enlargement has not significantly changed. No discrete nasopharynx mass  or hyperenhancement. Negative parapharyngeal and retropharyngeal spaces. No cervical lymphadenopathy identified. Aortic arch: Calcified aortic atherosclerosis. 3 vessel arch configuration. Right carotid system: Mild right CCA plaque without stenosis. Mild to moderate soft and calcified plaque at the right carotid bifurcation through ICA bulb without stenosis. Left carotid system: Minimal left CCA soft plaque without stenosis. Mild mostly calcified plaque at the left carotid bifurcation and ICA origin without stenosis. Vertebral arteries: Proximal subclavian arteries are patent with mild plaque and no significant stenosis. Both  vertebral arteries are diminutive, more so the right. There is chronic atherosclerosis at the right vertebral artery origin with at least moderate stenosis there, stable. Left vertebral artery origin is within normal limits. Both diminutive vertebral arteries remain patent to the skull base without additional stenosis. CTA HEAD Posterior circulation: Diminutive right vertebral artery terminates in PICA. Left PICA origin is patent. Diminutive left V4 segment continues to the vertebrobasilar junction. 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 This appearance unchanged since 2017 and likely congenital. Both posterior communicating arteries are present, the left is larger. There is mild bilateral P1 irregularity and stenosis. PCA branches are within normal limits. Anterior circulation: Both ICA siphons are patent. On the left mild to moderate calcified atherosclerosis results in mild supraclinoid stenosis. On the right similar mild to moderate calcified plaque and mild supraclinoid stenosis. Right side persistent trigeminal artery also with mild calcified plaque. Normal ophthalmic and posterior communicating artery origins. Patent carotid termini. Patent MCA and ACA origins. Mild new left ACA A1 segment stenosis on series 14, image 22. Diminutive anterior communicating artery. Mildly fenestrated proximal left A2 (normal variant). Bilateral ACA branches are stable and within normal limits. Left MCA M1 segment and bifurcation are patent without stenosis. Right MCA M1 segment and bifurcation are patent without stenosis. Bilateral MCA branches are stable and within normal limits. Venous sinuses: Early contrast timing.  Grossly patent. 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 Right vertebral artery terminates in PICA. Review  of the MIP images confirms the above findings IMPRESSION: 1. Negative for emergent large vessel occlusion. 2. 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 3. Mild posterior circulation atherosclerosis otherwise, mild bilateral P1 stenoses. 4. Bilateral carotid atherosclerosis but no hemodynamically significant stenosis (mild supraclinoid ICA stenosis). 5. Progressed left middle ear opacification since last year with chronic left mastoid effusion. 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 No discrete nasopharyngeal mass. Recommend ENT follow-up for left eustachian tube dysfunction. 6.  Aortic Atherosclerosis (ICD10-I70.0). Electronically Signed   By: Odessa Fleming M.D.   On: 06/17/2020 05:59    Discharge Instructions: Discharge Instructions    Ambulatory referral to Neurology   Complete by: As directed    Follow up with stroke clinic NP (Jessica Vanschaick or Darrol Angel, if both not available, consider Manson Allan, or A[facility]) at [facility] in about 4 weeks. Thanks.   Call MD for:  extreme fatigue   Complete by: As directed    Call MD for:  persistant dizziness or light-headedness   Complete by: As directed    Call MD for:  temperature >100.4   Complete by: As directed    Diet Carb Modified   Complete by: As directed    Increase activity  slowly   Complete by: As directed       Signed: Karsten Ro, MD 06/21/2020, 11:01 AM   Pager: [phone]

## 2020-06-21 NOTE — TOC Transition Note (Signed)
Transition of Care [facility]) - CM/SW Discharge Note   Patient Details  Name: Frank Luna MRN: [id number] Date of Birth: 05-15-1960  [facility] CM/SW Contact:  Kermit Balo, RN Phone Number: 06/21/2020, 11:17 AM   Clinical Narrative:    Patient is discharging to CIR today. CM signing off.   Final next level of care: IP Rehab Facility Barriers to Discharge: No Barriers Identified   Patient Goals and CMS Choice        Discharge Placement                       Discharge Plan and Services                                     Social Determinants of Health (SDOH) Interventions     Readmission Risk Interventions No flowsheet data found.

## 2020-06-21 NOTE — H&P (Addendum)
Physical Medicine and Rehabilitation Admission H&P    Chief Complaint  Patient presents with  . Cerebrovascular Accident  : HPI: Frank Luna is a 60 year old right-handed male with history of diabetes mellitus, hypertension, tobacco abuse, thrombocytopenia, prior stroke 2017 with minimal residual weakness maintained on aspirin and Plavix.  History taken from chart review and patient.  Patient lives with spouse.  1 level home 5 steps to entry.  Independent prior to admission.  He presented on 06/17/2020 with acute left hemiparesis and dysarthria.  Initially presented to [facility] after CT unremarkable for acute intracranial process.  Patient left AMA returning with persistent symptoms.  MRI showed a 1 cm acute infarction in the right corona radiata white matter.  Extensive chronic small vessel ischemic changes elsewhere throughout the brain as well as incidental findings of bilateral mastoid effusions.  CT angiogram of the head showed no emergent large vessel occlusion.  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  Admission chemistries unremarkable except glucose 126, alcohol negative, hemoglobin A1c 8.3 urine drug screen negative.  Echocardiogram ejection fraction 60-65%.  No wall motion abnormalities.  Presently maintained on aspirin 81 mg daily and Plavix 75 mg daily for CVA prophylaxis x3 weeks then aspirin alone.  Subcutaneous Lovenox for DVT prophylaxis.  Tolerating a regular consistency diet.  Due to patient's left monoparesis and slurred speech he was admitted for a comprehensive rehab program.  Please see preadmission assessment from earlier today as well.  Review of Systems  Constitutional: Negative for chills and fever.  HENT: Negative for hearing loss.   Eyes: Negative for blurred vision and double vision.  Respiratory: Negative for cough and shortness of breath.   Cardiovascular: Negative for chest pain and  leg swelling.  Gastrointestinal: Positive for constipation. Negative for heartburn and nausea.  Genitourinary: Negative for dysuria, flank pain and hematuria.  Skin: Negative for rash.  Neurological: Positive for speech change, focal weakness and weakness.  All other systems reviewed and are negative.  Past Medical History:  Diagnosis Date  . Diabetes (HCC)   . Hypertension   . Stroke (HCC)   . Thrombocytopenia (HCC)    History reviewed. No pertinent surgical history. Family History  Problem Relation Age of Onset  . Stroke Paternal Great-grandfather    Social History:  reports that he has been smoking cigarettes. He has been smoking about 1.00 pack per day. He has never used smokeless tobacco. He reports that he does not drink alcohol and does not use drugs. Allergies:  Allergies  Allergen Reactions  . Penicillins     Unknown reaction   Medications Prior to Admission  Medication Sig Dispense Refill  . acetaminophen (TYLENOL) 500 MG tablet Take 1,000 mg by mouth every 6 (six) hours as needed for headache (pain).    Marland Kitchen aspirin EC 325 MG EC tablet Take 1 tablet (325 mg total) by mouth daily. 90 tablet 3  . atorvastatin (LIPITOR) 40 MG tablet Take 1 tablet (40 mg total) by mouth daily at 6 PM. 90 tablet 3  . clopidogrel (PLAVIX) 75 MG tablet Take 1 tablet (75 mg total) by mouth daily. 30 tablet 1  . dapagliflozin propanediol (FARXIGA) 10 MG TABS tablet Take 10 mg by mouth daily.    Marland Kitchen glipiZIDE (GLUCOTROL XL) 10 MG 24 hr tablet Take 10 mg by mouth 2 (two) times daily.    Marland Kitchen LANTUS SOLOSTAR 100 UNIT/ML Solostar Pen Inject 15 Units into the  skin in the morning and at bedtime.    Marland Kitchen lisinopril (ZESTRIL) 40 MG tablet Take 40 mg by mouth daily.    . metoprolol succinate (TOPROL-XL) 50 MG 24 hr tablet Take 1 tablet (50 mg total) by mouth daily. Take with or immediately following a meal. (Patient taking differently: Take 75 mg by mouth daily. Take with or immediately following a meal.) 90 tablet  3    Drug Regimen Review Drug regimen was reviewed and remains appropriate with no significant issues identified  Home: Home Living Family/patient expects to be discharged to:: Private residence Living Arrangements: Spouse/significant other Available Help at Discharge: Family Type of Home: House Home Access: Stairs to enter Secretary/administrator of Steps: 5 Entrance Stairs-Rails: Can reach both Home Layout: One level Bathroom Shower/Tub: Health visitor: Handicapped height Home Equipment: Information systems manager  Lives With: Spouse   Functional History: Prior Function Level of Independence: Independent Comments: Pt was driving. Reports he was on disability.  Functional Status:  Mobility: Bed Mobility Overal bed mobility: Needs Assistance Bed Mobility: Supine to Sit Supine to sit: Min assist General bed mobility comments: OOb in recliner and returned to recliner at end of session Transfers Overall transfer level: Needs assistance Equipment used: 2 person hand held assist Transfers: Sit to/from Stand Sit to Stand: Mod assist,+2 physical assistance Stand pivot transfers: Min assist,+2 physical assistance,+2 safety/equipment General transfer comment: mod +2 for rise, steady, and correcting postural bias to L. STS x3, from recliner x1 and room chair x2, cues needed to shift weight to L during ADLs and Wb thro LUE at sink Ambulation/Gait Ambulation/Gait assistance: Mod assist,+2 physical assistance,+2 safety/equipment Gait Distance (Feet): 15 Feet (+10) Assistive device: 2 person hand held assist Gait Pattern/deviations: Decreased step length - left,Decreased stance time - left,Decreased stride length,Narrow base of support,Step-through pattern,Drifts right/left,Trunk flexed General Gait Details: Mod +2 for steadying, progressing LLE during swing phase and guarding L knee during stance (at times goes into genu recurvatum so requires posterior knee unlocking to prevent  hyperextension). Verbal cuing for midline posture, sequencing/weight-shifting. Seated rest after first bout of gait and 3 minutes of standing at sink for ADL tasks. Gait velocity: decr Gait velocity interpretation: <1.31 ft/sec, indicative of household ambulator    ADL: ADL Overall ADL's : Needs assistance/impaired Eating/Feeding: Set up,Sitting Eating/Feeding Details (indicate cue type and reason): in recliner Grooming: Oral care,Standing,Moderate assistance Grooming Details (indicate cue type and reason): standing at sink with pt needing MOD A +2 for standing balance and cues for sequencing to use LUE as stabilizer Upper Body Bathing: Moderate assistance,Sitting Upper Body Bathing Details (indicate cue type and reason): in recliner Lower Body Bathing: Moderate assistance Lower Body Bathing Details (indicate cue type and reason): Min A +2 sit<>stand Upper Body Dressing : Moderate assistance,Sitting Upper Body Dressing Details (indicate cue type and reason): to don back side gown, cues needed to use RUE as hand over hand assist to thread LUE through sleeve of gown Lower Body Dressing: Maximal assistance Lower Body Dressing Details (indicate cue type and reason): Min A +2 sit<>stand Toilet Transfer: Maximal assistance,+2 for physical assistance,Ambulation Toilet Transfer Details (indicate cue type and reason): simulated via functional mobiltiy within pts room, MOD A +2 to ambulate from recliner<>sink with pt needing assist at LLE to progress LLE during gait Toileting- Clothing Manipulation and Hygiene: Maximal assistance Toileting - Clothing Manipulation Details (indicate cue type and reason): Min A +2 sit<>stand Functional mobility during ADLs: Maximal assistance,+2 for physical assistance General ADL Comments: pt continues  to present with L sided hemiparesis, impaired balacne, and impaired strength and coordination  Cognition: Cognition Overall Cognitive Status: Impaired/Different from  baseline Arousal/Alertness: Awake/alert Orientation Level: Oriented to person,Oriented to place,Oriented to situation Attention: Focused,Sustained Focused Attention: Appears intact Sustained Attention: Impaired Sustained Attention Impairment: Verbal complex Memory: Impaired Memory Impairment: Retrieval deficit,Decreased recall of new information (Immediate: 4/5; delayed: 0/5; with cues: 0/5) Awareness: Impaired Awareness Impairment: Intellectual impairment Problem Solving: Impaired Problem Solving Impairment: Verbal complex,Verbal basic Executive Function: Sequencing,Organizing Sequencing: Impaired Sequencing Impairment: Verbal complex (Clock drawing: 0/4) Organizing: Impaired Organizing Impairment: Verbal complex (Backward digit span: 0/3) Cognition Arousal/Alertness: Awake/alert Behavior During Therapy: WFL for tasks assessed/performed,Impulsive Overall Cognitive Status: Impaired/Different from baseline Area of Impairment: Memory,Following commands,Safety/judgement,Awareness,Problem solving Orientation Level: Disoriented to,Time (did not know month or year, states he uses his phone normally for that info) Memory: Decreased short-term memory (repeated cues for LUE placement) Following Commands: Follows one step commands consistently,Follows multi-step commands with increased time Safety/Judgement: Decreased awareness of safety,Decreased awareness of deficits Awareness: Intellectual Problem Solving: Decreased initiation,Difficulty sequencing,Requires verbal cues,Requires tactile cues General Comments: Pt initiates mobility as soon as he is cued, at times impulsively. Pt inattentive to L, especially when participating in ADL or mobility tasks. Pt requires frequent cues to correct balance, but after 3-4 cues pt recognizes need to correct balance without PT/COTA input.  Physical Exam: Blood pressure (!) 170/87, pulse 60, temperature 98 F (36.7 C), temperature source Oral, resp. rate  17, SpO2 98 %. Physical Exam Vitals reviewed.  Constitutional:      General: He is not in acute distress.    Appearance: Normal appearance.  HENT:     Head: Normocephalic and atraumatic.     Right Ear: External ear normal.     Left Ear: External ear normal.     Nose: Nose normal.  Eyes:     General:        Right eye: No discharge.        Left eye: No discharge.     Extraocular Movements: Extraocular movements intact.  Cardiovascular:     Rate and Rhythm: Normal rate and regular rhythm.  Pulmonary:     Effort: Pulmonary effort is normal. No respiratory distress.     Breath sounds: No stridor.  Abdominal:     General: Bowel sounds are normal.     Comments: + Distended  Musculoskeletal:     Cervical back: Normal range of motion and neck supple.     Comments: No edema or tenderness in extremities  Skin:    General: Skin is warm and dry.  Neurological:     Mental Status: He is alert.     Comments: Alert and oriented to person place Makes eye contact with examiner.   Follows simple commands.   Fair insight and awareness. Motor: RUE/RLE: 5/5 proximal distal LLE: 5/5 proximal distal LUE: 0/5 proximal distal  Psychiatric:        Mood and Affect: Mood normal.        Behavior: Behavior normal.     Results for orders placed or performed during the hospital encounter of 06/17/20 (from the past 48 hour(s))  Glucose, capillary     Status: Abnormal   Collection Time: 06/19/20 12:24 PM  Result Value Ref Range   Glucose-Capillary 139 (H) 70 - 99 mg/dL    Comment: Glucose reference range applies only to samples taken after fasting for at least 8 hours.   Comment 1 Notify RN    Comment 2 Document in Chart  Glucose, capillary     Status: Abnormal   Collection Time: 06/19/20  4:39 PM  Result Value Ref Range   Glucose-Capillary 128 (H) 70 - 99 mg/dL    Comment: Glucose reference range applies only to samples taken after fasting for at least 8 hours.   Comment 1 Notify RN     Comment 2 Document in Chart   Glucose, capillary     Status: Abnormal   Collection Time: 06/19/20  7:33 PM  Result Value Ref Range   Glucose-Capillary 248 (H) 70 - 99 mg/dL    Comment: Glucose reference range applies only to samples taken after fasting for at least 8 hours.  Glucose, capillary     Status: Abnormal   Collection Time: 06/19/20  9:42 PM  Result Value Ref Range   Glucose-Capillary 163 (H) 70 - 99 mg/dL    Comment: Glucose reference range applies only to samples taken after fasting for at least 8 hours.  CBC     Status: Abnormal   Collection Time: 06/20/20  4:58 AM  Result Value Ref Range   WBC 6.1 4.0 - 10.5 K/uL   RBC 5.63 4.22 - 5.81 MIL/uL   Hemoglobin 16.7 13.0 - 17.0 g/dL   HCT 01.049.3 93.239.0 - 35.552.0 %   MCV 87.6 80.0 - 100.0 fL   MCH 29.7 26.0 - 34.0 pg   MCHC 33.9 30.0 - 36.0 g/dL   RDW 73.212.8 20.211.5 - 54.215.5 %   Platelets 82 (L) 150 - 400 K/uL    Comment: Immature Platelet Fraction may be clinically indicated, consider ordering this additional test [id number] CONSISTENT WITH PREVIOUS RESULT REPEATED TO VERIFY    nRBC 0.0 0.0 - 0.2 %    Comment: Performed at [facility], [address] [place], [place] [address]  Glucose, capillary     Status: Abnormal   Collection Time: 06/20/20  6:41 AM  Result Value Ref Range   Glucose-Capillary 161 (H) 70 - 99 mg/dL    Comment: Glucose reference range applies only to samples taken after fasting for at least 8 hours.  Glucose, capillary     Status: Abnormal   Collection Time: 06/20/20  9:49 PM  Result Value Ref Range   Glucose-Capillary 132 (H) 70 - 99 mg/dL    Comment: Glucose reference range applies only to samples taken after fasting for at least 8 hours.   Comment 1 Notify RN    Comment 2 Document in Chart   Glucose, capillary     Status: Abnormal   Collection Time: 06/21/20  6:11 AM  Result Value Ref Range   Glucose-Capillary 156 (H) 70 - 99 mg/dL    Comment: Glucose reference range applies only to samples taken  after fasting for at least 8 hours.   Comment 1 Notify RN    Comment 2 Document in Chart    No results found.     Medical Problem List and Plan: 1.  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  Admit to CIR 2.  Antithrombotics: -DVT/anticoagulation: Lovenox  -antiplatelet therapy: Aspirin 81 mg daily and Plavix 75 mg daily for 3 weeks total then aspirin alone 3. Pain Management: Tylenol as needed 4. Mood: Provide emotional support  -antipsychotic agents: N/A 5. Neuropsych: This patient is capable of making decisions on his  own behalf. 6. Skin/Wound Care: Routine skin checks 7. Fluids/Electrolytes/Nutrition: Routine in and outs.    CMP ordered for tomorrow 8.  Hypertension.  Lisinopril 40 mg daily, Toprol-XL 75 mg daily.    Monitor with increased mobility. 9.  Hyperlipidemia: Lipitor 10.  Diabetes mellitus with hyperglycemia.  Hemoglobin A1c 8.3.  SSI.  Presently on Lantus insulin 12 units nightly.  Patient also on Glucotrol 10 mg twice daily as well as farxiga 10 mg daily prior to admission.  Resume as needed  Monitor his increase mobility 11.  History of tobacco use.  Provide counseling 12.  History of thrombocytopenia.    Platelets 82 on 5/3, CBC ordered for tomorrow  Charlton Amor, PA-C 06/21/2020   I have personally performed a face to face diagnostic evaluation, including, but not limited to relevant history and physical exam findings, of this patient and developed relevant assessment and plan.  Additionally, I have reviewed and concur with the physician assistant's documentation above.  Maryla Morrow, MD, ABPMR  The patient's status has not changed. Any changes from the pre-admission screening or documentation from the acute chart are noted  above.   Maryla Morrow, MD, ABPMR

## 2020-06-21 NOTE — Progress Notes (Signed)
PMR Admission Coordinator Pre-Admission Assessment   Patient: Frank Luna is an 60 y.o., male MRN: [id number] DOB: [birth year] Height:   Weight:                                                                                                                                                    Insurance Information HMO:     PPO:      PCP:      IPA:      80/20:      OTHER:  PRIMARY: Medicaid [employer]      Policy#: [id number]      Subscriber: pt CM Name: faxed      Phone#: n/a     Fax#: [phone]  Pre-Cert#: [id number] auth for CIR via faxed approval with updates due to fax above on 5/9      Employer:  Benefits:  Phone #: [phone]     Name:  Eff. Date: eligible as of 06/18/20     Deduct:       Out of Pocket Max:       Life Max:   CIR:       SNF:  Outpatient:      Co-Pay:  Home Health:       Co-Pay:  DME:      Co-Pay:  Providers:  SECONDARY:       Policy#:       Phone#:    Artist:       Phone#:    The Data processing manager" for patients in Inpatient Rehabilitation Facilities with attached "Privacy Act Statement-Health Care Records" was provided and verbally reviewed with: Patient   Emergency Contact Information Contact Information       Name Relation Home Work [place] Spouse [phone]   [phone]    Olivia Mackie Daughter     [phone]         Current Medical History  Patient Admitting Diagnosis: R corona radiata CVA   History of Present Illness: Frank Luna is a 60 y.o. right-handed male with history of diabetes mellitus, hypertension, tobacco use, thrombocytopenia, prior stroke 2017 with minimal residual weakness maintained on aspirin and Plavix.  Presented with acute onset of left-sided weakness and slurred speech.  Initially presented to [facility] on  after CT negative, patient left AMA, but returning on 06/17/20 with persistent symptoms.  MRI showed a 1 cm acute infarction in the corona radiata white matter on the  right.  Extensive chronic small vessel ischemic changes elsewhere throughout the brain as well as incidental findings of bilateral mastoid effusions.  CT angiogram of the head showed no emergent large vessel occlusion.  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  Admission chemistries unremarkable except glucose 126, alcohol negative, hemoglobin A1c 8.3, urine  drug screen negative.  Echocardiogram with ejection fraction of 60 to 65% no wall motion abnormalities.  Presently maintained on aspirin 81 mg daily and Plavix 75 mg daily for CVA prophylaxis x3 weeks then aspirin alone.  Subcutaneous Lovenox for DVT prophylaxis.  Tolerating a regular consistency diet.  Due to patient's left-sided weakness and slurred speech recommendations of physical medicine rehab consult.   Complete NIHSS TOTAL: 4 Glasgow Coma Scale Score: 15   Past Medical History      Past Medical History:  Diagnosis Date  . Diabetes (HCC)    . Hypertension    . Stroke (HCC)    . Thrombocytopenia (HCC)        Family History  family history includes Stroke in his paternal great-grandfather.   Prior Rehab/Hospitalizations:  Has the patient had prior rehab or hospitalizations prior to admission? Yes [facility]   Has the patient had major surgery during 100 days prior to admission? No   Current Medications    Current Facility-Administered Medications:  .  acetaminophen (TYLENOL) tablet 650 mg, 650 mg, Oral, Q6H PRN **OR** acetaminophen (TYLENOL) suppository 650 mg, 650 mg, Rectal, Q6H PRN, Dolan AmenWinters, Steven, MD .  amLODipine (NORVASC) tablet 5 mg, 5 mg, Oral, Daily, Dolan AmenWinters, Steven, MD, 5 mg at 06/21/20 0946 .  aspirin EC tablet 81 mg, 81 mg, Oral, Daily, Remo Lippshen, Joshua Y, MD, 81 mg at 06/21/20 0945 .  atorvastatin (LIPITOR) tablet 40 mg, 40 mg, Oral, q1800, Dolan AmenWinters, Steven, MD, 40 mg at 06/20/20 1825 .  cholecalciferol (VITAMIN D3) tablet 1,000  Units, 1,000 Units, Oral, Daily, Remo Lippshen, Joshua Y, MD, 1,000 Units at 06/21/20 0944 .  clopidogrel (PLAVIX) tablet 75 mg, 75 mg, Oral, Daily, Marvel PlanXu, Jindong, MD, 75 mg at 06/21/20 0944 .  dapagliflozin propanediol (FARXIGA) tablet 10 mg, 10 mg, Oral, Daily, Doda, Vandana, MD, 10 mg at 06/21/20 0944 .  enoxaparin (LOVENOX) injection 40 mg, 40 mg, Subcutaneous, Q24H, Marvel PlanXu, Jindong, MD, 40 mg at 06/20/20 2149 .  insulin glargine (LANTUS) injection 12 Units, 12 Units, Subcutaneous, QHS, Dolan AmenWinters, Steven, MD, 12 Units at 06/20/20 2149 .  lisinopril (ZESTRIL) tablet 40 mg, 40 mg, Oral, Daily, Marvel PlanXu, Jindong, MD, 40 mg at 06/21/20 0944 .  metoprolol succinate (TOPROL-XL) 24 hr tablet 75 mg, 75 mg, Oral, Daily, Marvel PlanXu, Jindong, MD, 75 mg at 06/21/20 0944 .  nicotine (NICODERM CQ - dosed in mg/24 hours) patch 21 mg, 21 mg, Transdermal, Daily, Hoffman, Erik C, DO .  nicotine polacrilex (NICORETTE) gum 2 mg, 2 mg, Oral, PRN, Gust RungHoffman, Erik C, DO .  senna-docusate (Senokot-S) tablet 1 tablet, 1 tablet, Oral, QHS PRN, Dolan AmenWinters, Steven, MD   Patients Current Diet:  Diet Order                  Diet Carb Modified             Diet Carb Modified Fluid consistency: Thin; Room service appropriate? Yes with Assist  Diet effective now                         Precautions / Restrictions Precautions Precautions: Fall Precaution Comments: L-sided paresis and incoordination Restrictions Weight Bearing Restrictions: No    Has the patient had 2 or more falls or a fall with injury in the past year?Yes, at least 2 this admission   Prior Activity Level Community (5-7x/wk): driving prior to admit, no DME used, retired/disabled   Prior Functional Level Prior  Function Level of Independence: Independent Comments: Pt was driving. Reports he was on disability.   Self Care: Did the patient need help bathing, dressing, using the toilet or eating?  Independent   Indoor Mobility: Did the patient need assistance with walking from  room to room (with or without device)? Independent   Stairs: Did the patient need assistance with internal or external stairs (with or without device)? Independent   Functional Cognition: Did the patient need help planning regular tasks such as shopping or remembering to take medications? Independent   Home Assistive Devices / Equipment Home Assistive Devices/Equipment: None Home Equipment: Shower seat   Prior Device Use: Indicate devices/aids used by the patient prior to current illness, exacerbation or injury? None of the above   Current Functional Level Cognition   Arousal/Alertness: Awake/alert Overall Cognitive Status: Impaired/Different from baseline Orientation Level: Oriented to person,Oriented to place,Oriented to situation Following Commands: Follows one step commands consistently,Follows multi-step commands with increased time Safety/Judgement: Decreased awareness of safety,Decreased awareness of deficits General Comments: Pt initiates mobility as soon as he is cued, at times impulsively. Pt inattentive to L, especially when participating in ADL or mobility tasks. Pt requires frequent cues to correct balance, but after 3-4 cues pt recognizes need to correct balance without PT/COTA input. Attention: Focused,Sustained Focused Attention: Appears intact Sustained Attention: Impaired Sustained Attention Impairment: Verbal complex Memory: Impaired Memory Impairment: Retrieval deficit,Decreased recall of new information (Immediate: 4/5; delayed: 0/5; with cues: 0/5) Awareness: Impaired Awareness Impairment: Intellectual impairment Problem Solving: Impaired Problem Solving Impairment: Verbal complex,Verbal basic Executive Function: Sequencing,Organizing Sequencing: Impaired Sequencing Impairment: Verbal complex (Clock drawing: 0/4) Organizing: Impaired Organizing Impairment: Verbal complex (Backward digit span: 0/3)    Extremity Assessment (includes Sensation/Coordination)    Upper Extremity Assessment: LUE deficits/detail LUE Deficits / Details: Has limited movement at shoulder (can raise arm ~20 degrees) and at elbow (can touch hand to chin but brings head down to meet hand as well); reports it feels the same as his RUE (but has decreased proprioception with testing) LUE Sensation: decreased proprioception LUE Coordination: decreased fine motor,decreased gross motor  Lower Extremity Assessment: LLE deficits/detail LLE Deficits / Details: MMT scores of 4+ to 5, but impaired coordination limiting AROM with tasks; delayed proprioception detected in ankle and likely in knee, but not tested at knee LLE Sensation: decreased proprioception LLE Coordination: decreased gross motor,decreased fine motor (dysdiadochokinesia noted and possible dysmetria)     ADLs   Overall ADL's : Needs assistance/impaired Eating/Feeding: Set up,Sitting Eating/Feeding Details (indicate cue type and reason): in recliner Grooming: Oral care,Standing,Moderate assistance Grooming Details (indicate cue type and reason): standing at sink with pt needing MOD A +2 for standing balance and cues for sequencing to use LUE as stabilizer Upper Body Bathing: Moderate assistance,Sitting Upper Body Bathing Details (indicate cue type and reason): in recliner Lower Body Bathing: Moderate assistance Lower Body Bathing Details (indicate cue type and reason): Min A +2 sit<>stand Upper Body Dressing : Moderate assistance,Sitting Upper Body Dressing Details (indicate cue type and reason): to don back side gown, cues needed to use RUE as hand over hand assist to thread LUE through sleeve of gown Lower Body Dressing: Maximal assistance Lower Body Dressing Details (indicate cue type and reason): Min A +2 sit<>stand Toilet Transfer: Maximal assistance,+2 for physical assistance,Ambulation Toilet Transfer Details (indicate cue type and reason): simulated via functional mobiltiy within pts room, MOD A +2 to ambulate  from recliner<>sink with pt needing assist at LLE to progress LLE during gait Toileting- Clothing  Manipulation and Hygiene: Maximal assistance Toileting - Clothing Manipulation Details (indicate cue type and reason): Min A +2 sit<>stand Functional mobility during ADLs: Maximal assistance,+2 for physical assistance General ADL Comments: pt continues to present with L sided hemiparesis, impaired balacne, and impaired strength and coordination     Mobility   Overal bed mobility: Needs Assistance Bed Mobility: Supine to Sit Supine to sit: Min assist General bed mobility comments: OOb in recliner and returned to recliner at end of session     Transfers   Overall transfer level: Needs assistance Equipment used: 2 person hand held assist Transfers: Sit to/from Stand Sit to Stand: Mod assist,+2 physical assistance Stand pivot transfers: Min assist,+2 physical assistance,+2 safety/equipment General transfer comment: mod +2 for rise, steady, and correcting postural bias to L. STS x3, from recliner x1 and room chair x2, cues needed to shift weight to L during ADLs and Wb thro LUE at sink     Ambulation / Gait / Stairs / Wheelchair Mobility   Ambulation/Gait Ambulation/Gait assistance: Mod assist,+2 physical assistance,+2 safety/equipment Gait Distance (Feet): 15 Feet (+10) Assistive device: 2 person hand held assist Gait Pattern/deviations: Decreased step length - left,Decreased stance time - left,Decreased stride length,Narrow base of support,Step-through pattern,Drifts right/left,Trunk flexed General Gait Details: Mod +2 for steadying, progressing LLE during swing phase and guarding L knee during stance (at times goes into genu recurvatum so requires posterior knee unlocking to prevent hyperextension). Verbal cuing for midline posture, sequencing/weight-shifting. Seated rest after first bout of gait and 3 minutes of standing at sink for ADL tasks. Gait velocity: decr Gait velocity interpretation:  <1.31 ft/sec, indicative of household ambulator     Posture / Balance Dynamic Sitting Balance Sitting balance - Comments: Static sitting EOB no LOB, supervision for safety. Balance Overall balance assessment: Needs assistance Sitting-balance support: No upper extremity supported,Feet supported Sitting balance-Leahy Scale: Fair Sitting balance - Comments: Static sitting EOB no LOB, supervision for safety. Standing balance support: Bilateral upper extremity supported,During functional activity Standing balance-Leahy Scale: Poor Standing balance comment: reliant on external assist; standing tasks at sink with standing tolerance ~3 minutes, LUE propped in WB position for proprioceptive input     Special needs/care consideration Skin abrasion to elbow from fall, Diabetic management yes and Behavioral consideration hx of leaving AMA        Previous Home Environment (from acute therapy documentation) Living Arrangements: Spouse/significant other  Lives With: Spouse Available Help at Discharge: Family Type of Home: House Home Layout: One level Home Access: Stairs to enter Entrance Stairs-Rails: Can reach both Entrance Stairs-Number of Steps: 5 Bathroom Shower/Tub: Health visitor: Handicapped height Home Care Services: No   Discharge Living Setting Plans for Discharge Living Setting: Patient's home Type of Home at Discharge: House Discharge Home Layout: One level Discharge Home Access: Stairs to enter Entrance Stairs-Rails: Can reach both Entrance Stairs-Number of Steps: 7 to back door (primary entrance), 3 to front door Discharge Bathroom Shower/Tub: Walk-in shower Discharge Bathroom Toilet: Handicapped height Discharge Bathroom Accessibility: Yes How Accessible: Accessible via walker Does the patient have any problems obtaining your medications?: No   Social/Family/Support Systems Patient Roles: Spouse Anticipated Caregiver: spouse, Rachael Zapanta Anticipated  Caregiver's Contact Information: [phone] Ability/Limitations of Caregiver: supervision Caregiver Availability: 24/7 Discharge Plan Discussed with Primary Caregiver: Yes Is Caregiver In Agreement with Plan?: Yes Does Caregiver/Family have Issues with Lodging/Transportation while Pt is in Rehab?: No     Goals Patient/Family Goal for Rehab: PT mod I, OT supervision to min, SLP mod  I Expected length of stay: 14-17 days Pt/Family Agrees to Admission and willing to participate: Yes Program Orientation Provided & Reviewed with Pt/Caregiver Including Roles  & Responsibilities: Yes  Barriers to Discharge: Insurance for SNF coverage   Decrease burden of Care through IP rehab admission: n/a   Possible need for SNF placement upon discharge:no    Patient Condition: This patient's condition remains as documented in the consult dated 06/19/2020, in which the Rehabilitation Physician determined and documented that the patient's condition is appropriate for intensive rehabilitative care in an inpatient rehabilitation facility. Will admit to inpatient rehab today.   Preadmission Screen Completed By:  Stephania Fragmin, PT, DPT 06/21/2020 11:41 AM ______________________________________________________________________   Discussed status with Dr. Allena Katz on 06/21/20 at 11:41 AM  and received approval for admission today.    Admission Coordinator:  Stephania Fragmin, PT, DPT time 11:41 AM Dorna Bloom 06/21/20

## 2020-06-21 NOTE — Progress Notes (Signed)
Pt transferred via w/c to be admitted to  4W-21 from 3W with pt belongings.

## 2020-06-21 NOTE — IPOC Note (Signed)
Individualized overall Plan of Care [facility]) Patient Details Name: Frank Luna MRN: [id number] DOB: Jun 17, 1960  Admitting Diagnosis: Acute cerebral infarction [facility])  Hospital Problems: Principal Problem:   Acute cerebral infarction [facility]) Active Problems:   Small vessel disease, cerebrovascular   Hypoalbuminemia due to protein-calorie malnutrition (HCC)   Uncontrolled type 2 diabetes mellitus with hyperglycemia (HCC)      Functional Problem List: Nursing Endurance,Safety,Medication Management  PT Balance,Behavior,Endurance,Motor,Safety,Sensory,Skin Integrity  OT Balance,Cognition,Endurance,Motor,Safety,Sensory  SLP    TR         Basic ADL's: OT Grooming,Bathing,Dressing,Toileting     Advanced  ADL's: OT       Transfers: PT Bed to Chair,Bed Mobility,Car  OT Toilet,Tub/Shower     Locomotion: PT Ambulation,Stairs     Additional Impairments: OT Fuctional Use of Upper Extremity  SLP        TR      Anticipated Outcomes Item Anticipated Outcome  Self Feeding independent  Swallowing      Basic self-care  supervision  Toileting  supervision   Bathroom Transfers supervision  Bowel/Bladder  n/a  Transfers  supervision  Locomotion  supervision  Communication     Cognition     Pain  n/a  Safety/Judgment  maintain safety with cues/reminders   Therapy Plan: PT Intensity: Minimum of 1-2 x/day ,45 to 90 minutes PT Frequency: 5 out of 7 days PT Duration Estimated Length of Stay: 2 weeks OT Intensity: Minimum of 1-2 x/day, 45 to 90 minutes OT Frequency: 5 out of 7 days OT Duration/Estimated Length of Stay: 14-15 days      Team Interventions: Nursing Interventions Patient/Family Education,Discharge Planning,Medication Management,Disease Management/Prevention  PT interventions Ambulation/gait training,Balance/vestibular training,Cognitive remediation/compensation,Community reintegration,Discharge planning,Disease Surveyor, minerals stimulation,Functional mobility training,Neuromuscular re-education,Pain management,Patient/family education,Skin care/wound management,Psychosocial support,Stair training,Splinting/orthotics,Therapeutic Activities,Therapeutic Exercise,UE/LE Strength taining/ROM,UE/LE Psychiatrist propulsion/positioning,Visual/perceptual remediation/compensation  OT Interventions Balance/vestibular training,Cognitive remediation/compensation,Discharge planning,Functional mobility training,DME/adaptive equipment instruction,Neuromuscular re-education,Pain management,Patient/family education,Psychosocial support,Therapeutic Activities,Self Care/advanced ADL retraining,Therapeutic Exercise,UE/LE Strength taining/ROM,UE/LE Coordination activities,Visual/perceptual remediation/compensation  SLP Interventions    TR Interventions    SW/CM Interventions Discharge Planning,Psychosocial Support,Patient/Family Education   Barriers to Discharge MD  Medical stability, Weight and Behavior  Nursing Decreased caregiver support,Home environment access/layout 1 level home with spouse, 5 step entry  PT Decreased caregiver support,Home environment access/layout,Insurance for SNF coverage,Behavior    OT      SLP      SW Insurance for SNF coverage,Medication compliance     Team Discharge Planning: Destination: PT-Home ,OT- Home , SLP-  Projected Follow-up: PT-Home health PT, OT-  Outpatient OT, SLP-  Projected Equipment Needs: PT-To be determined, OT- To be determined, SLP-  Equipment Details: PT-pt owns no DME, OT-  Patient/family involved in discharge planning: PT- Patient,  OT-Patient, SLP-   MD ELOS: 12-15 days. Medical Rehab Prognosis:  Good Assessment:  60 year old right-handed male with history of diabetes mellitus, hypertension, tobacco abuse, thrombocytopenia, prior stroke 2017 with minimal residual weakness maintained on aspirin and Plavix.  Patient lives with  spouse.  1 level home 5 steps to entry.  Independent prior to admission.  He presented on 06/17/2020 with acute left hemiparesis and dysarthria.  Initially presented to [facility] after CT unremarkable for acute intracranial process.  Patient left AMA returning with persistent symptoms.  MRI showed a 1 cm acute infarction in the right corona radiata white matter.  Extensive chronic small vessel ischemic changes elsewhere throughout the brain as well as incidental findings of bilateral mastoid effusions.  CT angiogram of the head showed no  emergent large vessel occlusion.  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  Admission chemistries unremarkable except glucose 126, alcohol negative, hemoglobin A1c 8.3 urine drug screen negative.  Echocardiogram ejection fraction 60-65%.  No wall motion abnormalities.  Presently maintained on aspirin 81 mg daily and Plavix 75 mg daily for CVA prophylaxis x3 weeks then aspirin alone.  Tolerating a regular consistency diet.  Due to patient's left monoparesis and slurred speech he was admitted for a comprehensive rehab program.  Will set goals for Supervision with PT/PT.  Due to the current state of emergency, patients may not be receiving their 3-hours of Medicare-mandated therapy.  See Team Conference Notes for weekly updates to the plan of care

## 2020-06-21 NOTE — Progress Notes (Signed)
Physical Medicine and Rehabilitation Consult Reason for Consult: Left-sided weakness and slurred speech Referring Physician: Dr. Oswaldo Done     HPI: Frank Luna is a 60 y.o. right-handed male with history of diabetes mellitus, hypertension, tobacco use, thrombocytopenia, prior stroke 2017 with minimal residual weakness maintained on aspirin and Plavix.  Per chart review patient lives with spouse.  1 level home 5 steps to entry.  Independent prior to admission.  Presented with acute onset of left-sided weakness and slurred speech.  Initially presented to [facility] after CT negative patient left AMA returning with persistent symptoms.  MRI showed a 1 cm acute infarction in the corona radiata white matter on the right.  Extensive chronic small vessel ischemic changes elsewhere throughout the brain as well as incidental findings of bilateral mastoid effusions.  CT angiogram of the head showed no emergent large vessel occlusion.  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  Admission chemistries unremarkable except glucose 126, alcohol negative, hemoglobin A1c 8.3, urine drug screen negative.  Echocardiogram with ejection fraction of 60 to 65% no wall motion abnormalities.  Presently maintained on aspirin 81 mg daily and Plavix 75 mg daily for CVA prophylaxis x3 weeks then aspirin alone.  Subcutaneous Lovenox for DVT prophylaxis.  Tolerating a regular consistency diet.  Due to patient's left-sided weakness and slurred speech recommendations of physical medicine rehab consult.     Review of Systems  Constitutional: Negative for chills and fever.  HENT: Negative for hearing loss.   Eyes: Negative for blurred vision and double vision.  Respiratory: Negative for cough and shortness of breath.   Cardiovascular: Negative for chest pain, palpitations and leg swelling.  Gastrointestinal: Positive for constipation. Negative for  heartburn, nausea and vomiting.  Genitourinary: Negative for dysuria, flank pain and hematuria.  Skin: Negative for rash.  Neurological: Positive for speech change and weakness.  All other systems reviewed and are negative.       Past Medical History:  Diagnosis Date  . Diabetes (HCC)    . Hypertension    . Stroke (HCC)    . Thrombocytopenia (HCC)      History reviewed. No pertinent surgical history.      Family History  Problem Relation Age of Onset  . Stroke Paternal Great-grandfather      Social History:  reports that he has been smoking cigarettes. He has been smoking about 1.00 pack per day. He has never used smokeless tobacco. He reports that he does not drink alcohol and does not use drugs. Allergies:       Allergies  Allergen Reactions  . Penicillins        Unknown reaction          Medications Prior to Admission  Medication Sig Dispense Refill  . acetaminophen (TYLENOL) 500 MG tablet Take 1,000 mg by mouth every 6 (six) hours as needed for headache (pain).      Marland Kitchen aspirin EC 325 MG EC tablet Take 1 tablet (325 mg total) by mouth daily. 90 tablet 3  . atorvastatin (LIPITOR) 40 MG tablet Take 1 tablet (40 mg total) by mouth daily at 6 PM. 90 tablet 3  . clopidogrel (PLAVIX) 75 MG tablet Take 1 tablet (75 mg total) by mouth daily. 30 tablet 1  . dapagliflozin propanediol (FARXIGA) 10 MG TABS tablet Take 10 mg by mouth daily.      Marland Kitchen glipiZIDE (GLUCOTROL XL) 10  MG 24 hr tablet Take 10 mg by mouth 2 (two) times daily.      Marland Kitchen LANTUS SOLOSTAR 100 UNIT/ML Solostar Pen Inject 15 Units into the skin in the morning and at bedtime.      Marland Kitchen lisinopril (ZESTRIL) 40 MG tablet Take 40 mg by mouth daily.      . metoprolol succinate (TOPROL-XL) 50 MG 24 hr tablet Take 1 tablet (50 mg total) by mouth daily. Take with or immediately following a meal. (Patient taking differently: Take 75 mg by mouth daily. Take with or immediately following a meal.) 90 tablet 3      Home: Home  Living Family/patient expects to be discharged to:: Private residence Living Arrangements: Spouse/significant other Available Help at Discharge: Family Type of Home: House Home Access: Stairs to enter Secretary/administrator of Steps: 5 Entrance Stairs-Rails: Can reach both Home Layout: One level Bathroom Shower/Tub: Health visitor: Handicapped height Home Equipment: Information systems manager  Lives With: Spouse  Functional History: Prior Function Level of Independence: Independent Comments: Pt was driving. Reports he was on disability. Functional Status:  Mobility: Bed Mobility Overal bed mobility: Needs Assistance Bed Mobility: Supine to Sit Supine to sit: Min assist General bed mobility comments: Pt using R bed rail to assist with trunk ascension initiation to sit up L EOB, swinging legs off bed but pt unable to push up from L elbow to complete trunk ascension without minA. Transfers Overall transfer level: Needs assistance Equipment used: 2 person hand held assist Transfers: Sit to/from [employer] Sit to Stand: Min assist,+2 physical assistance,+2 safety/equipment Stand pivot transfers: Min assist,+2 physical assistance,+2 safety/equipment General transfer comment: Cued pt for feet placement, with pt pushing up to stand, needing minAx2 for steadying due to trunk sway. MinAx2 with bil HHA to take side steps to L to transfer to recliner, needing verbal and visual cues for L foot placement and cues to weight shift and bring R to L foot. Ambulation/Gait Ambulation/Gait assistance: Min assist,+2 physical assistance,+2 safety/equipment Gait Distance (Feet): 3 Feet Assistive device: 2 person hand held assist Gait Pattern/deviations: Decreased step length - left,Decreased stance time - left,Decreased stride length,Narrow base of support General Gait Details: Several side steps to L with bil HHA minAx2, cuing pt via visual and verbal cues for L foot placement,  intermittently bringing R foot too proximal resulting in very narrow BOS and increased trunk sway. Gait velocity: reduced Gait velocity interpretation: <1.31 ft/sec, indicative of household ambulator   ADL: ADL Overall ADL's : Needs assistance/impaired Eating/Feeding: Set up,Sitting Eating/Feeding Details (indicate cue type and reason): in recliner Grooming: Moderate assistance,Sitting Grooming Details (indicate cue type and reason): in recliner Upper Body Bathing: Moderate assistance,Sitting Upper Body Bathing Details (indicate cue type and reason): in recliner Lower Body Bathing: Moderate assistance Lower Body Bathing Details (indicate cue type and reason): Min A +2 sit<>stand Upper Body Dressing : Maximal assistance,Sitting Upper Body Dressing Details (indicate cue type and reason): in recliner Lower Body Dressing: Maximal assistance Lower Body Dressing Details (indicate cue type and reason): Min A +2 sit<>stand Toilet Transfer: Minimal assistance,+2 for physical assistance,Stand-pivot Toilet Transfer Details (indicate cue type and reason): bed>recliner on his right Toileting- Clothing Manipulation and Hygiene: Maximal assistance Toileting - Clothing Manipulation Details (indicate cue type and reason): Min A +2 sit<>stand   Cognition: Cognition Overall Cognitive Status: Impaired/Different from baseline Arousal/Alertness: Awake/alert Orientation Level: Oriented to person,Oriented to place,Oriented to situation Attention: Focused,Sustained Focused Attention: Appears intact Sustained Attention: Impaired Sustained Attention Impairment: Verbal  complex Memory: Impaired Memory Impairment: Retrieval deficit,Decreased recall of new information (Immediate: 4/5; delayed: 0/5; with cues: 0/5) Awareness: Impaired Awareness Impairment: Intellectual impairment Problem Solving: Impaired Problem Solving Impairment: Verbal complex,Verbal basic Executive Function:  Sequencing,Organizing Sequencing: Impaired Sequencing Impairment: Verbal complex (Clock drawing: 0/4) Organizing: Impaired Organizing Impairment: Verbal complex (Backward digit span: 0/3) Cognition Arousal/Alertness: Awake/alert Behavior During Therapy: WFL for tasks assessed/performed Overall Cognitive Status: Impaired/Different from baseline Area of Impairment: Orientation,Memory,Following commands,Safety/judgement,Awareness,Problem solving Orientation Level: Disoriented to,Time (did not know month or year, states he uses his phone normally for that info) Memory: Decreased short-term memory Following Commands: Follows one step commands consistently,Follows one step commands with increased time Safety/Judgement: Decreased awareness of safety,Decreased awareness of deficits Awareness: Intellectual Problem Solving: Slow processing,Decreased initiation,Difficulty sequencing,Requires verbal cues,Requires tactile cues General Comments: Pt disoriented to time and forgetting how to perform familiar tasks, like unlock phone or navigate screen to find current date. Pt with slow processing and needing simple multi-modal cues to sequence tasks. STM deficits and poor safety and deficits awareness, forgetting instruction to ask for assistance and not to get up alone with pt believing he is safe enough to transfer back to bed alone, which is not the case. Pt able to demonstrate understanding of pushing nurse call button for help, but unsure whether he will actually remember to do so when he wants anything.   Blood pressure (!) 181/92, pulse 68, temperature 98.1 F (36.7 C), temperature source Oral, resp. rate 18, SpO2 97 %. Physical Exam Vitals and nursing note reviewed.  Constitutional:      Appearance: He is obese.  HENT:     Head: Normocephalic and atraumatic.  Eyes:     Extraocular Movements: Extraocular movements intact.     Conjunctiva/sclera: Conjunctivae normal.     Pupils: Pupils are equal,  round, and reactive to light.  Cardiovascular:     Rate and Rhythm: Normal rate and regular rhythm.     Pulses: Normal pulses.     Heart sounds: Normal heart sounds. No murmur heard.    Pulmonary:     Effort: Pulmonary effort is normal. No respiratory distress.     Breath sounds: Normal breath sounds. No stridor.  Abdominal:     General: Abdomen is flat. Bowel sounds are normal. There is no distension.     Palpations: Abdomen is soft.  Musculoskeletal:        General: No swelling or tenderness.  Skin:    General: Skin is warm and dry.  Neurological:     Mental Status: He is oriented to person, place, and time.     Cranial Nerves: No cranial nerve deficit or dysarthria.     Sensory: No sensory deficit.     Motor: No tremor or abnormal muscle tone.     Coordination: Coordination abnormal.     Gait: Gait abnormal.     Comments: Patient is alert in no acute distress.  Makes eye contact with examiner.  Oriented to person place and time.  Follows simple commands.  Fair awareness of deficits.  Motor strength is 5/5 in the right deltoid, bicep, tricep, grip, hip flexor, knee extensor, ankle dorsiflexor and plantar flexor To minus left deltoid and biceps 0 at the tricep trace finger flexors and extensors Left lower extremity 3 - hip flexor and 4 - knee extensor 3 - ankle dorsiflexor Sensation intact to light touch bilateral upper and lower limbs  Psychiatric:        Mood and Affect: Mood normal.  Behavior: Behavior normal.        Lab Results Last 24 Hours       Results for orders placed or performed during the hospital encounter of 06/17/20 (from the past 24 hour(s))  Glucose, capillary     Status: Abnormal    Collection Time: 06/18/20  6:15 AM  Result Value Ref Range    Glucose-Capillary 103 (H) 70 - 99 mg/dL  Glucose, capillary     Status: Abnormal    Collection Time: 06/18/20  4:32 PM  Result Value Ref Range    Glucose-Capillary 129 (H) 70 - 99 mg/dL  Glucose, capillary      Status: Abnormal    Collection Time: 06/18/20  9:42 PM  Result Value Ref Range    Glucose-Capillary 174 (H) 70 - 99 mg/dL    Comment 1 Notify RN      Comment 2 Document in Chart    CBC     Status: Abnormal    Collection Time: 06/19/20  3:21 AM  Result Value Ref Range    WBC 6.3 4.0 - 10.5 K/uL    RBC 5.72 4.22 - 5.81 MIL/uL    Hemoglobin 16.9 13.0 - 17.0 g/dL    HCT 85.6 31.4 - 97.0 %    MCV 86.9 80.0 - 100.0 fL    MCH 29.5 26.0 - 34.0 pg    MCHC 34.0 30.0 - 36.0 g/dL    RDW 26.3 78.5 - 88.5 %    Platelets 78 (L) 150 - 400 K/uL    nRBC 0.0 0.0 - 0.2 %  Comprehensive metabolic panel     Status: Abnormal    Collection Time: 06/19/20  3:21 AM  Result Value Ref Range    Sodium 137 135 - 145 mmol/L    Potassium 3.7 3.5 - 5.1 mmol/L    Chloride 101 98 - 111 mmol/L    CO2 27 22 - 32 mmol/L    Glucose, Bld 138 (H) 70 - 99 mg/dL    BUN 10 6 - 20 mg/dL    Creatinine, Ser 0.27 0.61 - 1.24 mg/dL    Calcium 8.8 (L) 8.9 - 10.3 mg/dL    Total Protein 6.2 (L) 6.5 - 8.1 g/dL    Albumin 3.3 (L) 3.5 - 5.0 g/dL    AST 21 15 - 41 U/L    ALT 37 0 - 44 U/L    Alkaline Phosphatase 84 38 - 126 U/L    Total Bilirubin 1.2 0.3 - 1.2 mg/dL    GFR, Estimated >74 >12 mL/min    Anion gap 9 5 - 15       Imaging Results (Last 48 hours)  DG Elbow 2 Views Left   Result Date: 06/17/2020 CLINICAL DATA:  Fall, elbow stiffness. EXAM: LEFT ELBOW - 2 VIEW COMPARISON:  None. FINDINGS: There is no evidence of fracture, dislocation, or joint effusion. There is no evidence of significant arthropathy or other focal bone abnormality. Soft tissues are unremarkable. IMPRESSION: Negative. Electronically Signed   By: Bary Richard M.D.   On: 06/17/2020 19:52    CT HEAD WO CONTRAST   Result Date: 06/17/2020 CLINICAL DATA:  Follow-up stroke.  Fall from bed with head trauma. EXAM: CT HEAD WITHOUT CONTRAST TECHNIQUE: Contiguous axial images were obtained from the base of the skull through the vertex without intravenous  contrast. COMPARISON:  Head CT dated 06/17/2020.  Brain MRI dated 06/17/2020. FINDINGS: Brain: Generalized parenchymal volume loss with commensurate dilatation of the ventricles and sulci. No  evidence of hydrocephalus. Chronic small vessel ischemic changes are seen within the bilateral periventricular and subcortical white matter regions. Small old lacunar infarcts are seen within the bilateral basal ganglia regions. No mass, hemorrhage, edema or other evidence of acute parenchymal abnormality. No extra-axial hemorrhage. Vascular: Chronic calcified atherosclerotic changes of the large vessels at the skull base. No unexpected hyperdense vessel. Skull: Normal. Negative for fracture or focal lesion. Sinuses/Orbits: No acute finding. Other: None. IMPRESSION: 1. No acute findings. No intracranial mass, hemorrhage or edema. No skull fracture. 2. Chronic small vessel ischemic changes in the white matter and basal ganglia regions. Electronically Signed   By: Bary Richard M.D.   On: 06/17/2020 19:56    MR BRAIN WO CONTRAST   Result Date: 06/17/2020 CLINICAL DATA:  Left-sided arm and leg weakness. EXAM: MRI HEAD WITHOUT CONTRAST TECHNIQUE: Multiplanar, multiecho pulse sequences of the brain and surrounding structures were obtained without intravenous contrast. COMPARISON:  Negative acute CT evaluations for 202021 and earlier today. FINDINGS: Brain: Study suffers from considerable motion degradation. There is a 1 cm acute infarction within the white matter of the corona radiography on the right. No other acute infarction. Extensive chronic small-vessel ischemic changes affect the pons. No focal cerebellar infarction. Old small vessel infarctions are present affecting the thalami, basal ganglia and hemispheric white matter. No large vessel territory infarction. No mass, hemorrhage, hydrocephalus or extra-axial collection. Vascular: Major vessels at the base of the brain show flow. Skull and upper cervical spine: Negative  Sinuses/Orbits: Clear/normal Other: Bilateral mastoid effusions. IMPRESSION: 1 cm acute infarction in the corona radiata white matter on the right. Extensive chronic small-vessel ischemic changes elsewhere throughout brain as outlined above. Bilateral mastoid effusions. Electronically Signed   By: Paulina Fusi M.D.   On: 06/17/2020 16:12    ECHOCARDIOGRAM COMPLETE BUBBLE STUDY   Result Date: 06/18/2020    ECHOCARDIOGRAM REPORT   Patient Name:   Frank Luna Date of Exam: 06/18/2020 Medical Rec #:  [id number]    Height:       69.0 in Accession #:    [id number]   Weight:       189.6 lb Date of Birth:  [birth year]    BSA:          2.020 m Patient Age:    59 years     BP:           167/79 mmHg Patient Gender: M            HR:           77 bpm. Exam Location:  Inpatient Procedure: 2D Echo, Cardiac Doppler, Color Doppler and Saline Contrast Bubble            Study Indications:    CVA  History:        Patient has prior history of Echocardiogram examinations, most                 recent 02/05/2016. Stroke; Risk Factors:Hypertension, Diabetes,                 Current Smoker and Obesity.  Sonographer:    Lavenia Atlas Referring Phys: [id number] [facility]  Sonographer Comments: Patient is morbidly obese and suboptimal apical window. Image acquisition challenging due to patient body habitus. IMPRESSIONS  1. Left ventricular ejection fraction, by estimation, is 60 to 65%. The left ventricle has normal function. The left ventricle has no regional wall motion abnormalities. There is mild concentric left ventricular hypertrophy. Left ventricular diastolic parameters  were normal.  2. Right ventricular systolic function is normal. The right ventricular size is normal. Tricuspid regurgitation signal is inadequate for assessing PA pressure.  3. The mitral valve is normal in structure. No evidence of mitral valve regurgitation. No evidence of mitral stenosis.  4. The aortic valve is tricuspid. Aortic valve regurgitation is not  visualized. Mild aortic valve sclerosis is present, with no evidence of aortic valve stenosis.  5. The inferior vena cava is normal in size with <50% respiratory variability, suggesting right atrial pressure of 8 mmHg.  6. Agitated saline contrast bubble study was negative, with no evidence of any interatrial shunt. FINDINGS  Left Ventricle: Left ventricular ejection fraction, by estimation, is 60 to 65%. The left ventricle has normal function. The left ventricle has no regional wall motion abnormalities. The left ventricular internal cavity size was normal in size. There is  mild concentric left ventricular hypertrophy. Left ventricular diastolic parameters were normal. Normal left ventricular filling pressure. Right Ventricle: The right ventricular size is normal. No increase in right ventricular wall thickness. Right ventricular systolic function is normal. Tricuspid regurgitation signal is inadequate for assessing PA pressure. Left Atrium: Left atrial size was normal in size. Right Atrium: Right atrial size was normal in size. Pericardium: There is no evidence of pericardial effusion. Mitral Valve: The mitral valve is normal in structure. No evidence of mitral valve regurgitation. No evidence of mitral valve stenosis. Tricuspid Valve: The tricuspid valve is normal in structure. Tricuspid valve regurgitation is not demonstrated. No evidence of tricuspid stenosis. Aortic Valve: The aortic valve is tricuspid. Aortic valve regurgitation is not visualized. Mild aortic valve sclerosis is present, with no evidence of aortic valve stenosis. Pulmonic Valve: The pulmonic valve was normal in structure. Pulmonic valve regurgitation is not visualized. No evidence of pulmonic stenosis. Aorta: The aortic root is normal in size and structure. Venous: The inferior vena cava is normal in size with less than 50% respiratory variability, suggesting right atrial pressure of 8 mmHg. IAS/Shunts: No atrial level shunt detected by color  flow Doppler. Agitated saline contrast was given intravenously to evaluate for intracardiac shunting. Agitated saline contrast bubble study was negative, with no evidence of any interatrial shunt.  LEFT VENTRICLE PLAX 2D LVIDd:         4.90 cm  Diastology LVIDs:         3.00 cm  LV e' medial:    7.62 cm/s LV PW:         1.40 cm  LV E/e' medial:  9.6 LV IVS:        1.40 cm  LV e' lateral:   8.59 cm/s LVOT diam:     2.10 cm  LV E/e' lateral: 8.5 LV SV:         69 LV SV Index:   34 LVOT Area:     3.46 cm  RIGHT VENTRICLE RV Basal diam:  3.50 cm RV S prime:     12.30 cm/s TAPSE (M-mode): 2.4 cm LEFT ATRIUM             Index       RIGHT ATRIUM           Index LA diam:        4.00 cm 1.98 cm/m  RA Area:     12.80 cm LA Vol (A2C):   48.6 ml 24.06 ml/m RA Volume:   28.70 ml  14.21 ml/m LA Vol (A4C):   34.8 ml 17.23 ml/m LA Biplane Vol: 41.4  ml 20.50 ml/m  AORTIC VALVE LVOT Vmax:   108.00 cm/s LVOT Vmean:  70.200 cm/s LVOT VTI:    0.199 m  AORTA Ao Root diam: 3.30 cm MITRAL VALVE MV Area (PHT): 3.99 cm    SHUNTS MV Decel Time: 190 msec    Systemic VTI:  0.20 m MV E velocity: 73.30 cm/s  Systemic Diam: 2.10 cm MV A velocity: 61.70 cm/s MV E/A ratio:  1.19 Armanda Magic MD Electronically signed by Armanda Magic MD Signature Date/Time: 06/18/2020/1:05:44 PM    Final     CT ANGIO HEAD NECK W WO CM W PERF   Result Date: 06/17/2020 CLINICAL DATA:  60 year old male with left side numbness and weakness upon waking. 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 EXAM: CT ANGIOGRAPHY HEAD AND NECK TECHNIQUE: Multidetector CT imaging of the head and neck was performed using the standard protocol during bolus administration of intravenous contrast. Multiplanar CT image reconstructions and MIPs were obtained to evaluate the vascular anatomy. Carotid stenosis measurements (when applicable) are obtained utilizing NASCET criteria, using the distal internal carotid diameter as the  denominator. CONTRAST:  OMNIPAQUE IOHEXOL 350 MG/ML SOLN COMPARISON:  CTA head and neck and CT brain perfusion 06/08/2019. CTA head and neck 02/04/2016. FINDINGS: CTA NECK Skeleton: Carious dentition. Progressive left middle ear opacification since last year. Chronic mastoid effusions greater on the left. No acute osseous abnormality identified. Upper chest: Stable upper lungs, mild pulmonary scarring. No superior mediastinal lymphadenopathy. Other neck: Partially effaced pharynx similar to last year. Chronic adenoid soft tissue enlargement has not significantly changed. No discrete nasopharynx mass or hyperenhancement. Negative parapharyngeal and retropharyngeal spaces. No cervical lymphadenopathy identified. Aortic arch: Calcified aortic atherosclerosis. 3 vessel arch configuration. Right carotid system: Mild right CCA plaque without stenosis. Mild to moderate soft and calcified plaque at the right carotid bifurcation through ICA bulb without stenosis. Left carotid system: Minimal left CCA soft plaque without stenosis. Mild mostly calcified plaque at the left carotid bifurcation and ICA origin without stenosis. Vertebral arteries: Proximal subclavian arteries are patent with mild plaque and no significant stenosis. Both vertebral arteries are diminutive, more so the right. There is chronic atherosclerosis at the right vertebral artery origin with at least moderate stenosis there, stable. Left vertebral artery origin is within normal limits. Both diminutive vertebral arteries remain patent to the skull base without additional stenosis. CTA HEAD Posterior circulation: Diminutive right vertebral artery terminates in PICA. Left PICA origin is patent. Diminutive left V4 segment continues to the vertebrobasilar junction. 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 This appearance unchanged since 2017 and likely congenital. Both posterior communicating arteries are present, the left is larger. There is mild bilateral P1 irregularity and stenosis. PCA branches are within normal limits. Anterior circulation: Both ICA siphons are patent. On the left mild to moderate calcified atherosclerosis results in mild supraclinoid stenosis. On the right similar mild to moderate calcified plaque and mild supraclinoid stenosis. Right side persistent trigeminal artery also with mild calcified plaque. Normal ophthalmic and posterior communicating artery origins. Patent carotid termini. Patent MCA and ACA origins. Mild new left ACA A1 segment stenosis on series 14, image 22. Diminutive anterior communicating artery. Mildly fenestrated proximal left A2 (normal variant). Bilateral ACA branches are stable and within normal limits. Left MCA M1 segment and bifurcation are patent without stenosis.  Right MCA M1 segment and bifurcation are patent without stenosis. Bilateral MCA branches are stable and within normal limits. Venous sinuses: Early contrast timing.  Grossly patent. 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 Right vertebral artery terminates in PICA. Review of the MIP images confirms the above findings IMPRESSION: 1. Negative for emergent large vessel occlusion. 2. 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 3. Mild posterior circulation atherosclerosis otherwise, mild bilateral P1 stenoses. 4. Bilateral carotid atherosclerosis but no hemodynamically significant stenosis (mild supraclinoid ICA stenosis). 5. Progressed left middle ear opacification since last year with chronic left mastoid effusion. 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 No discrete nasopharyngeal  mass. Recommend ENT follow-up for left eustachian tube dysfunction. 6.  Aortic Atherosclerosis (ICD10-I70.0). Electronically Signed   By: Odessa Fleming M.D.   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 1. Does the need for close, 24 hr/day medical supervision in concert with the patient's rehab needs make it unreasonable for this patient to be served in a less intensive setting? Yes 2. Co-Morbidities requiring supervision/potential complications: Hypertension, diabetes, history of prior CVA 3. Due to bladder management, bowel management, safety, skin/wound care, disease management, medication administration, pain management and patient education, does the patient require 24 hr/day rehab nursing? Yes 4. Does the patient require coordinated care of a physician, rehab nurse, therapy disciplines of PT, OT, speech to address physical and functional deficits in the context of the above medical diagnosis(es)? Yes Addressing deficits in the following areas: balance, endurance, locomotion, strength, transferring, bowel/bladder control, bathing, dressing and psychosocial support 5. Can the patient actively participate in an intensive therapy program of at least 3 hrs of therapy per day at least 5 days per week? Yes 6. The potential for patient to make measurable gains while on inpatient rehab is good 7. Anticipated functional outcomes upon discharge from inpatient rehab are modified independent  with PT, supervision and min assist with OT, modified independent with SLP. 8. Estimated rehab length of stay to reach the above functional goals is: 17 to 20 days 9. Anticipated discharge destination: Home 10. Overall Rehab/Functional Prognosis: good   RECOMMENDATIONS: This patient's condition is appropriate for continued rehabilitative care in the following setting: CIR Patient has agreed to participate in recommended program. Yes Note  that insurance prior authorization may be required for reimbursement for recommended care.   Comment:      Charlton Amor, PA-C 06/19/2020  "I have personally performed a face to face diagnostic evaluation of this patient.  Additionally, I have reviewed and concur with the physician assistant's documentation above." Erick Colace M.D. [facility] Fellow Am Acad of Phys Med and Rehab Diplomate Am Board of Electrodiagnostic Med Fellow Am Board of Interventional Pain

## 2020-06-22 ENCOUNTER — Other Ambulatory Visit: Payer: Self-pay

## 2020-06-22 DIAGNOSIS — E8809 Other disorders of plasma-protein metabolism, not elsewhere classified: Secondary | ICD-10-CM

## 2020-06-22 DIAGNOSIS — E1165 Type 2 diabetes mellitus with hyperglycemia: Secondary | ICD-10-CM | POA: Diagnosis not present

## 2020-06-22 DIAGNOSIS — D696 Thrombocytopenia, unspecified: Secondary | ICD-10-CM

## 2020-06-22 DIAGNOSIS — I10 Essential (primary) hypertension: Secondary | ICD-10-CM

## 2020-06-22 DIAGNOSIS — E46 Unspecified protein-calorie malnutrition: Secondary | ICD-10-CM

## 2020-06-22 DIAGNOSIS — I679 Cerebrovascular disease, unspecified: Secondary | ICD-10-CM | POA: Diagnosis not present

## 2020-06-22 LAB — COMPREHENSIVE METABOLIC PANEL
ALT: 57 U/L — ABNORMAL HIGH (ref 0–44)
AST: 31 U/L (ref 15–41)
Albumin: 3.2 g/dL — ABNORMAL LOW (ref 3.5–5.0)
Alkaline Phosphatase: 83 U/L (ref 38–126)
Anion gap: 8 (ref 5–15)
BUN: 20 mg/dL (ref 6–20)
CO2: 26 mmol/L (ref 22–32)
Calcium: 9 mg/dL (ref 8.9–10.3)
Chloride: 103 mmol/L (ref 98–111)
Creatinine, Ser: 1.07 mg/dL (ref 0.61–1.24)
GFR, Estimated: >60 mL/min (ref >60)
Glucose, Bld: 150 mg/dL — ABNORMAL HIGH (ref 70–99)
Potassium: 4.1 mmol/L (ref 3.5–5.1)
Sodium: 137 mmol/L (ref 135–145)
Total Bilirubin: 0.9 mg/dL (ref 0.3–1.2)
Total Protein: 6.1 g/dL — ABNORMAL LOW (ref 6.5–8.1)

## 2020-06-22 LAB — CBC WITH DIFFERENTIAL/PLATELET
Abs Immature Granulocytes: 0.02 10*3/uL (ref 0.00–0.07)
Basophils Absolute: 0.1 10*3/uL (ref 0.0–0.1)
Basophils Relative: 1 %
Eosinophils Absolute: 0.2 10*3/uL (ref 0.0–0.5)
Eosinophils Relative: 3 %
HCT: 49.7 % (ref 39.0–52.0)
Hemoglobin: 16.6 g/dL (ref 13.0–17.0)
Immature Granulocytes: 0 %
Lymphocytes Relative: 25 %
Lymphs Abs: 1.9 10*3/uL (ref 0.7–4.0)
MCH: 29.5 pg (ref 26.0–34.0)
MCHC: 33.4 g/dL (ref 30.0–36.0)
MCV: 88.3 fL (ref 80.0–100.0)
Monocytes Absolute: 0.7 10*3/uL (ref 0.1–1.0)
Monocytes Relative: 10 %
Neutro Abs: 4.5 10*3/uL (ref 1.7–7.7)
Neutrophils Relative %: 61 %
Platelets: 89 10*3/uL — ABNORMAL LOW (ref 150–400)
RBC: 5.63 MIL/uL (ref 4.22–5.81)
RDW: 13.1 % (ref 11.5–15.5)
WBC: 7.4 10*3/uL (ref 4.0–10.5)
nRBC: 0 % (ref 0.0–0.2)

## 2020-06-22 LAB — GLUCOSE, CAPILLARY
Glucose-Capillary: 162 mg/dL — ABNORMAL HIGH (ref 70–99)
Glucose-Capillary: 126 mg/dL — ABNORMAL HIGH (ref 70–99)
Glucose-Capillary: 143 mg/dL — ABNORMAL HIGH (ref 70–99)
Glucose-Capillary: 158 mg/dL — ABNORMAL HIGH (ref 70–99)

## 2020-06-22 MED ORDER — PROSOURCE PLUS PO LIQD
30.0000 mL | Freq: Two times a day (BID) | ORAL | Status: DC
  Administered 2020-06-22 – 2020-07-03 (×21): 30 mL via ORAL
  Filled 2020-06-22 (×19): qty 30

## 2020-06-22 NOTE — Evaluation (Signed)
Physical Therapy Assessment and Plan  Patient Details  Name: Frank Luna MRN: [id number] Date of Birth: [birth year]  PT Diagnosis: Abnormal posture, Abnormality of gait, Cognitive deficits, Difficulty walking, Hemiparesis non-dominant, Impaired cognition, Impaired sensation and Muscle weakness Rehab Potential: Good ELOS: 2 weeks   Today's Date: 06/22/2020 PT Individual Time: 3086-5784 PT Individual Time Calculation (min): 75 min    Hospital Problem: Active Problems:   Small vessel disease, cerebrovascular   Past Medical History:  Past Medical History:  Diagnosis Date  . Diabetes ([place])   . Hypertension   . Stroke ([place])   . Thrombocytopenia ([place])    Past Surgical History: History reviewed. No pertinent surgical history.  Assessment & Plan Clinical Impression: Patient is a 60 year old right-handed male with history of diabetes mellitus, hypertension, tobacco abuse, thrombocytopenia, prior stroke 2017 with minimal residual weakness maintained on aspirin and Plavix.  History taken from chart review and patient.  Patient lives with spouse.  1 level home 5 steps to entry.  Independent prior to admission.  He presented on 06/17/2020 with acute left hemiparesis and dysarthria.  Initially presented to [facility] after CT unremarkable for acute intracranial process.  Patient left AMA returning with persistent symptoms.  MRI showed a 1 cm acute infarction in the right corona radiata white matter.  Extensive chronic small vessel ischemic changes elsewhere throughout the brain as well as incidental findings of bilateral mastoid effusions.  CT angiogram of the head showed no emergent large vessel occlusion.  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  Admission chemistries unremarkable except glucose 126, alcohol negative, hemoglobin A1c 8.3 urine drug screen negative.  Echocardiogram ejection fraction 60-65%.  No wall motion  abnormalities.  Presently maintained on aspirin 81 mg daily and Plavix 75 mg daily for CVA prophylaxis x3 weeks then aspirin alone.  Subcutaneous Lovenox for DVT prophylaxis.  Tolerating a regular consistency diet.  Due to patient's left monoparesis and slurred speech he was admitted for a comprehensive rehab program.   Patient transferred to [facility] on 06/21/2020 .   Patient currently requires min with mobility secondary to muscle weakness, decreased cardiorespiratoy endurance, impaired timing and sequencing, unbalanced muscle activation, decreased coordination and decreased motor planning, decreased attention to left and decreased motor planning, decreased initiation, decreased attention, decreased awareness, decreased problem solving, decreased safety awareness and decreased memory and decreased standing balance, decreased postural control, hemiplegia and decreased balance strategies.  Prior to hospitalization, patient was independent  with mobility and lived with Spouse in a House home.  Home access is 7Stairs to enter.  Patient will benefit from skilled PT intervention to maximize safe functional mobility, minimize fall risk and decrease caregiver burden for planned discharge home with 24 hour supervision and assist.  Anticipate patient will benefit from follow up [facility] at discharge.  PT - End of Session Activity Tolerance: Tolerates 30+ min activity with multiple rests Endurance Deficit: Yes PT Assessment Rehab Potential (ACUTE/IP ONLY): Good PT Barriers to Discharge: Decreased caregiver support;Home environment access/layout;Insurance for SNF coverage;Behavior PT Patient demonstrates impairments in the following area(s): Balance;Behavior;Endurance;Motor;Safety;Sensory;Skin Integrity PT Transfers Functional Problem(s): Bed to Chair;Bed Mobility;Car PT Locomotion Functional Problem(s): Ambulation;Stairs PT Plan PT Intensity: Minimum of 1-2 x/day ,45 to 90 minutes PT Frequency: 5 out of 7 days PT  Duration Estimated Length of Stay: 2 weeks PT Treatment/Interventions: Ambulation/gait training;Balance/vestibular training;Cognitive remediation/compensation;Community reintegration;Discharge planning;Disease management/prevention;DME/adaptive equipment instruction;Functional electrical stimulation;Functional mobility training;Neuromuscular re-education;Pain management;Patient/family education;Skin care/wound management;Psychosocial support;Stair training;Splinting/orthotics;Therapeutic Activities;Therapeutic Exercise;UE/LE  Strength taining/ROM;UE/LE Coordination activities;Wheelchair propulsion/positioning;Visual/perceptual remediation/compensation PT Transfers Anticipated Outcome(s): supervision PT Locomotion Anticipated Outcome(s): supervision PT Recommendation Recommendations for Other Services: Neuropsych consult Follow Up Recommendations: Home health PT Patient destination: Home Equipment Recommended: To be determined Equipment Details: pt owns no DME  PT Evaluation Precautions/Restrictions Precautions Precautions: Fall Precaution Comments: L hemi (LUE>LLE). Mild L inattention Restrictions Weight Bearing Restrictions: No General Chart Reviewed: Yes Additional Pertinent History: history of diabetes mellitus, hypertension, tobacco abuse, thrombocytopenia, prior stroke 2017 with minimal residual weakness maintained on aspirin and Plavix Home Living/Prior Functioning Home Living Available Help at Discharge: Family (Wife's daughter also available PRN) Type of Home: House Home Access: Stairs to enter [employer] of Steps: 7 Entrance Stairs-Rails: Can reach both;Left;Right Home Layout: Two level;One level;Laundry or work area in basement Alternate Therapist, sports of Steps: Walks around the house to access basement (grass) Bathroom Shower/Tub: Gaffer  Lives With: Spouse Prior Function Level of Independence: Independent with gait;Independent with  transfers;Independent with homemaking with ambulation;Independent with basic ADLs  Able to Take Stairs?: Yes Driving: Yes Vocation: On disability Comments: Wife manages finances Vision/Perception  Vision - Assessment Eye Alignment: Within Functional Limits Ocular Range of Motion: Within Functional Limits Tracking/Visual Pursuits: Able to track stimulus in all quads without difficulty Perception Perception: Within Functional Limits Praxis Praxis: Intact  Cognition Overall Cognitive Status: Impaired/Different from baseline Arousal/Alertness: Awake/alert Orientation Level: Oriented to person;Oriented to place;Oriented to situation;Disoriented to time ("2016") Focused Attention: Appears intact Sustained Attention: Impaired Sustained Attention Impairment: Verbal complex;Functional complex Problem Solving: Impaired Problem Solving Impairment: Verbal basic;Functional basic;Functional complex Safety/Judgment: Impaired Comments: Per chart review, pt with 2 falls in the hospital due to not calling staff for assistance Sensation Sensation Light Touch: Impaired by gross assessment Hot/Cold: Appears Intact Proprioception: Appears Intact Stereognosis: Not tested Additional Comments: Able to detect light touch but dimminshed sensation. LUE > LLE Coordination Gross Motor Movements are Fluid and Coordinated: No Fine Motor Movements are Fluid and Coordinated: No Coordination and Movement Description: Blocked movement patterns and effortful 2/2 L hemibody weakness (LUE>LLE). Finger Nose Finger Test: UTA on LUE due to weakness Heel Shin Test: Limited ability due to hip flexor weakness on LUE. No apparent dyscoordination with task Motor  Motor Motor: Hemiplegia Motor - Skilled Clinical Observations: L hemi - LUE>LLE  Trunk/Postural Assessment  Cervical Assessment Cervical Assessment: Within Functional Limits Thoracic Assessment Thoracic Assessment: Within Functional Limits Lumbar  Assessment Lumbar Assessment: Exceptions to [facility] (posterior pelvic tilt) Postural Control Postural Control: Within Functional Limits  Balance Balance Balance Assessed: Yes Static Sitting Balance Static Sitting - Balance Support: Feet supported;No upper extremity supported Static Sitting - Level of Assistance: 5: Stand by assistance Dynamic Sitting Balance Dynamic Sitting - Balance Support: During functional activity;Feet supported;No upper extremity supported Dynamic Sitting - Level of Assistance: 4: Min Insurance risk surveyor Standing - Balance Support: No upper extremity supported Static Standing - Level of Assistance: 4: Min assist Dynamic Standing Balance Dynamic Standing - Balance Support: During functional activity;No upper extremity supported Dynamic Standing - Level of Assistance: 3: Mod assist Extremity Assessment      RLE Assessment RLE Assessment: Within Functional Limits General Strength Comments: Grossly 5/5 LLE Assessment LLE Assessment: Exceptions to [facility] General Strength Comments: Ankle DF 4/5, knee ext 4-/5, hip flex 4-/5  Care Tool Care Tool Bed Mobility Roll left and right activity   Roll left and right assist level: Minimal Assistance - Patient > 75%    Sit to lying activity   Sit to  lying assist level: Minimal Assistance - Patient > 75%    Lying to sitting edge of bed activity   Lying to sitting edge of bed assist level: Minimal Assistance - Patient > 75%     Care Tool Transfers Sit to stand transfer   Sit to stand assist level: Minimal Assistance - Patient > 75%    Chair/bed transfer   Chair/bed transfer assist level: Minimal Assistance - Patient > 75%     Physiological scientist transfer assist level: Minimal Assistance - Patient > 75%      Care Tool Locomotion Ambulation   Assist level: Moderate Assistance - Patient 50 - 74% Assistive device: No Device Max distance: 49f  Walk 10 feet activity   Assist  level: Moderate Assistance - Patient - 50 - 74% Assistive device: No Device   Walk 50 feet with 2 turns activity Walk 50 feet with 2 turns activity did not occur: Safety/medical concerns      Walk 150 feet activity Walk 150 feet activity did not occur: Safety/medical concerns      Walk 10 feet on uneven surfaces activity Walk 10 feet on uneven surfaces activity did not occur: Safety/medical concerns      Stairs Stair activity did not occur: Safety/medical concerns        Walk up/down 1 step activity Walk up/down 1 step or curb (drop down) activity did not occur: Safety/medical concerns     Walk up/down 4 steps activity did not occuR: Safety/medical concerns  Walk up/down 4 steps activity      Walk up/down 12 steps activity Walk up/down 12 steps activity did not occur: Safety/medical concerns      Pick up small objects from floor Pick up small object from the floor (from standing position) activity did not occur: Safety/medical concerns      Wheelchair Will patient use wheelchair at discharge?: No   Wheelchair activity did not occur: N/A      Wheel 50 feet with 2 turns activity Wheelchair 50 feet with 2 turns activity did not occur: N/A    Wheel 150 feet activity Wheelchair 150 feet activity did not occur: N/A      Refer to Care Plan for Long Term Goals  SHORT TERM GOAL WEEK 1 PT Short Term Goal 1 (Week 1): Pt will complete bed mobility with CGA and no hospital bed features PT Short Term Goal 2 (Week 1): Pt will complete bed<>chair transfers with CGA and LRAD PT Short Term Goal 3 (Week 1): Pt will ambulated 1545fwith minA and LRAD PT Short Term Goal 4 (Week 1): Pt will initiate stair training  Recommendations for other services: Neuropsych  Skilled Therapeutic Intervention Mobility Bed Mobility Bed Mobility: Rolling Right;Rolling Left;Supine to Sit;Sit to Supine Rolling Right: Minimal Assistance - Patient > 75% (for LUE management) Rolling Left:  Supervision/Verbal cueing Supine to Sit: Minimal Assistance - Patient > 75% Sit to Supine: Minimal Assistance - Patient > 75% Transfers Transfers: Sit to Stand;Stand to Sit;Stand Pivot Transfers Sit to Stand: Minimal Assistance - Patient > 75% Stand to Sit: Minimal Assistance - Patient > 75% Stand Pivot Transfers: Minimal Assistance - Patient > 75% Stand Pivot Transfer Details: Verbal cues for gait pattern;Verbal cues for safe use of DME/AE;Manual facilitation for weight shifting;Verbal cues for technique;Verbal cues for precautions/safety;Verbal cues for sequencing;Manual facilitation for weight bearing;Manual facilitation for placement;Visual cues/gestures for precautions/safety;Visual cues for safe use of DME/AE;Tactile cues for placement;Tactile  cues for initiation;Tactile cues for weight shifting;Tactile cues for posture Transfer (Assistive device): None Locomotion  Gait Ambulation: Yes Gait Assistance: Minimal Assistance - Patient > 75%;Moderate Assistance - Patient 50-74% Gait Distance (Feet): 10 Feet Assistive device: None Gait Assistance Details: Tactile cues for initiation;Tactile cues for placement;Tactile cues for sequencing;Tactile cues for posture;Visual cues for safe use of DME/AE;Manual facilitation for placement;Manual facilitation for weight shifting;Verbal cues for technique;Verbal cues for precautions/safety;Verbal cues for sequencing;Verbal cues for safe use of DME/AE;Verbal cues for gait pattern Gait Assistance Details: 97f modA with no AD. 351fwtih minA and RW Gait Gait: Yes Gait Pattern: Impaired Gait Pattern: Step-to pattern;Decreased step length - right;Decreased step length - left;Decreased stance time - left;Decreased hip/knee flexion - left;Decreased dorsiflexion - left;Poor foot clearance - left;Trunk flexed Gait velocity: Decreased Stairs / Additional Locomotion Stairs: No Wheelchair Mobility Wheelchair Mobility: No  Skilled intervention: Pt greeted  supine in bed, awake and agreeable to therapy session. Reports mild headache which he says his RN is aware. Pt alert and oriented x3, reports it's "2016" and unaware of the month (pt unable to recall year ~30 minutes later during session). Initiated functional mobility as outlined above. Requires light minA for bed mobility, minA for sit<>stand transfers, minA for stand<>pivot transfers, and ambulates with modA and no AD ~1046fWith RW and LUE RW hand splint, pt is able to ambulate ~71f8fth minA. Car transfer completed with minA but needs cues for safety approach and technique. Question mild L inattention during functional mobility tasks. 5xSTS 31.7 seconds from low mat table with no UE support, completed with SBA. Pt pleasant and cooperative throughout session. Reinforced "call don't fall" policy and pt verbalized understanding.   Instructed pt in results of PT evaluation as detailed above, PT POC, rehab potential, rehab goals, and discharge recommendations. Additionally discussed CIR's policies regarding fall safety and use of chair alarm and/or quick release belt. Pt verbalized understanding and in agreement. Will update pt's family members as they become available.   Discharge Criteria: Patient will be discharged from PT if patient refuses treatment 3 consecutive times without medical reason, if treatment goals not met, if there is a change in medical status, if patient makes no progress towards goals or if patient is discharged from hospital.  The above assessment, treatment plan, treatment alternatives and goals were discussed and mutually agreed upon: by patient  ChriAlger Simons DPT 06/22/2020, 9:51 AM

## 2020-06-22 NOTE — Progress Notes (Signed)
Inpatient Rehabilitation Care Coordinator Assessment and Plan Patient Details  Name: Frank Luna MRN: [id number] Date of Birth: 1960-03-13  Today's Date: 06/22/2020  Hospital Problems: Active Problems:   Small vessel disease, cerebrovascular  Past Medical History:  Past Medical History:  Diagnosis Date  . Diabetes ([facility])   . Hypertension   . Stroke ([facility])   . Thrombocytopenia ([facility])    Past Surgical History: History reviewed. No pertinent surgical history. Social History:  reports that he has been smoking cigarettes. He has a 45.00 pack-year smoking history. He has never used smokeless tobacco. He reports that he does not drink alcohol and does not use drugs.  Family / Support Systems Marital Status: Married Patient Roles: Spouse,Parent Spouse/Significant Other: [name] [phone] Children: Sandra-daughter [phone] Other Supports: Step daughter also involved Anticipated Caregiver: [name] Ability/Limitations of Caregiver: supervision-min assist Caregiver Availability: 24/7 Family Dynamics: Close with family and children-pt very independent and does not want to rely on others. He is one who will try even though it may be unsafe. He's upset over disability determination and monthly amount  Social History Preferred language: English Religion:  Cultural Background: NO issues Education: HS Read: Yes Write: Yes Employment Status: Disabled Marine scientist Issues: NO issues Guardian/Conservator: None-according to MD pt is capable of making decisions on his own. Will include wife since pt repprts have had multiple CVA's   Abuse/Neglect Abuse/Neglect Assessment Can Be Completed: Yes Physical Abuse: Denies Verbal Abuse: Denies Sexual Abuse: Denies Exploitation of patient/patient's resources: Denies Self-Neglect: Denies  Emotional Status Pt's affect, behavior and adjustment status: Pt has been able to be independent even with having multiple strokes. He hopes she  will progress and be mobile again this time. The hand AFO on walker really helps him. He is one who admits he is stuborn and hard headed. Recent Psychosocial Issues: other health issues-multiple CVA's Psychiatric History: No history but when talking with pt he seems depressed and down regarding having another CVA and starting al over again. Will have neuro-psych see while here Substance Abuse History: Tobacco aware needs to quit but is  not sure can. Will continue to discuss the resources available to him to try to quit  Patient / Family Perceptions, Expectations & Goals Pt/Family understanding of illness & functional limitations: Pt and wife have a good understanding of his stroke and deficits. it is on the same side as before. Pt does talk with the MD and feels his questions have been answered. Premorbid pt/family roles/activities: Husband, father, retiree, friend, etc Anticipated changes in roles/activities/participation: resume Pt/family expectations/goals: Pt states: " I want to do for myself and not have to ask nobody."  Wife states: " I hope he can make progress and do well there."  [employer]: None Premorbid Home Care/DME Agencies: Other (Comment) (has rw, elevated commode and tub seat) Transportation available at discharge: Wife-pt did drive prior to admission Resource referrals recommended: Neuropsychology  Discharge Planning Living Arrangements: Spouse/significant other Support Systems: Spouse/significant other,Children,Friends/neighbors Type of Residence: Private residence Insurance Resources: [employer] (specify county) Surveyor, quantity Resources: Family Support,[facility] Financial Screen Referred: Previously completed Living Expenses: Lives with family Money Management: Spouse Does the patient have any problems obtaining your medications?: No Home Management: Wife Patient/Family Preliminary Plans: Return home with wife who is retired and able to assist. Pt does  not want her to have to help him and insists he can do on his own. Which at times makes him a safety risk. Aware being evaluated today and goals set. Will work on  discharge needs Care Coordinator Barriers to Discharge: Insurance for SNF coverage,Medication compliance Care Coordinator Anticipated Follow Up Needs: HH/OP  Clinical Impression Pleasant gentleman who is somewhat depressed over continuing to have strokes, but is not changing his diet and continues to smoke which are risk factors with CVA. Wife is involved and can assist if needed. Will await therapy evaluations and work on discharge needs.  Lucy Chris 06/22/2020, 10:25 AM

## 2020-06-22 NOTE — Progress Notes (Signed)
Inpatient Rehabilitation Center Individual Statement of Services  Patient Name:  Frank Luna  Date:  06/22/2020  Welcome to the Inpatient Rehabilitation Center.  Our goal is to provide you with an individualized program based on your diagnosis and situation, designed to meet your specific needs.  With this comprehensive rehabilitation program, you will be expected to participate in at least 3 hours of rehabilitation therapies Monday-Friday, with modified therapy programming on the weekends.  Your rehabilitation program will include the following services:  Physical Therapy (PT), Occupational Therapy (OT), Speech Therapy (ST), 24 hour per day rehabilitation nursing, Neuropsychology, Care Coordinator, Rehabilitation Medicine, Nutrition Services and Pharmacy Services  Weekly team conferences will be held on Wednesday to discuss your progress.  Your Inpatient Rehabilitation Care Coordinator will talk with you frequently to get your input and to update you on team discussions.  Team conferences with you and your family in attendance may also be held.  Expected length of stay: 14-15 days  Overall anticipated outcome: supervision with cueing  Depending on your progress and recovery, your program may change. Your Inpatient Rehabilitation Care Coordinator will coordinate services and will keep you informed of any changes. Your Inpatient Rehabilitation Care Coordinator's name and contact numbers are listed  below.  The following services may also be recommended but are not provided by the Inpatient Rehabilitation Center:   Driving Evaluations  Home Health Rehabiltiation Services  Outpatient Rehabilitation Services    Arrangements will be made to provide these services after discharge if needed.  Arrangements include referral to agencies that provide these services.  Your insurance has been verified to be:  Medicaid Your primary doctor is:  Graylin Shiver  Pertinent information will be shared with  your doctor and your insurance company.  Inpatient Rehabilitation Care Coordinator:  Dossie Der, [place] [phone] or [name]  Information discussed with and copy given to patient by: Lucy Chris, 06/22/2020, 10:27 AM

## 2020-06-22 NOTE — Progress Notes (Signed)
Inpatient Rehabilitation  Patient information reviewed and entered into eRehab system by Haniel Fix M. Coleta Grosshans, M.A., CCC/SLP, PPS Coordinator.  Information including medical coding, functional ability and quality indicators will be reviewed and updated through discharge.    

## 2020-06-22 NOTE — Evaluation (Signed)
Occupational Therapy Assessment and Plan  Patient Details  Name: Frank Luna MRN: [id number] Date of Birth: 1960/12/22  OT Diagnosis: cognitive deficits and hemiplegia affecting non-dominant side Rehab Potential: Rehab Potential (ACUTE ONLY): Good ELOS: 14-15 days   Today's Date: 06/22/2020 OT Individual Time: 1100-1200 OT Individual Time Calculation (min): 60 min     Hospital Problem: Active Problems:   Small vessel disease, cerebrovascular   Hypoalbuminemia due to protein-calorie malnutrition ([place])   Uncontrolled type 2 diabetes mellitus with hyperglycemia ([facility])   Past Medical History:  Past Medical History:  Diagnosis Date  . Diabetes ([place])   . Hypertension   . Stroke ([place])   . Thrombocytopenia ([place])    Past Surgical History: History reviewed. No pertinent surgical history.  Assessment & Plan Clinical Impression:   Frank Luna is a 60 year old right-handed male with history of diabetes mellitus, hypertension, tobacco abuse, thrombocytopenia, prior stroke 2017 with minimal residual weakness maintained on aspirin and Plavix.  History taken from chart review and patient.  Patient lives with spouse.  1 level home 5 steps to entry.  Independent prior to admission.  He presented on 06/17/2020 with acute left hemiparesis and dysarthria.  Initially presented to [facility] after CT unremarkable for acute intracranial process.  Patient left AMA returning with persistent symptoms.  MRI showed a 1 cm acute infarction in the right corona radiata white matter.  Extensive chronic small vessel ischemic changes elsewhere throughout the brain as well as incidental findings of bilateral mastoid effusions.  CT angiogram of the head showed no emergent large vessel occlusion.  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  Admission chemistries unremarkable except glucose 126, alcohol negative, hemoglobin A1c 8.3 urine drug screen  negative.  Echocardiogram ejection fraction 60-65%.  No wall motion abnormalities.  Presently maintained on aspirin 81 mg daily and Plavix 75 mg daily for CVA prophylaxis x3 weeks then aspirin alone.  Subcutaneous Lovenox for DVT prophylaxis.  Tolerating a regular consistency diet.  Due to patient's left monoparesis and slurred speech he was admitted for a comprehensive rehab program.  Please see preadmission assessment from earlier today as well. Patient transferred to CIR on 06/21/2020 .    Patient currently requires mod with basic self-care skills secondary to muscle weakness, abnormal tone, unbalanced muscle activation and decreased coordination, decreased problem solving, decreased safety awareness, decreased memory and delayed processing and decreased standing balance and hemiplegia.  Prior to hospitalization, patient was fully independent and driving.   Patient will benefit from skilled intervention to increase independence with basic self-care skills prior to discharge home with care partner.  Anticipate patient will require 24 hour supervision and follow up outpatient.  OT - End of Session Endurance Deficit: Yes OT Assessment Rehab Potential (ACUTE ONLY): Good OT Patient demonstrates impairments in the following area(s): Balance;Cognition;Endurance;Motor;Safety;Sensory OT Basic ADL's Functional Problem(s): Grooming;Bathing;Dressing;Toileting OT Transfers Functional Problem(s): Toilet;Tub/Shower OT Additional Impairment(s): Fuctional Use of Upper Extremity OT Plan OT Intensity: Minimum of 1-2 x/day, 45 to 90 minutes OT Frequency: 5 out of 7 days OT Duration/Estimated Length of Stay: 14-15 days OT Treatment/Interventions: Balance/vestibular training;Cognitive remediation/compensation;Discharge planning;Functional mobility training;DME/adaptive equipment instruction;Neuromuscular re-education;Pain management;Patient/family education;Psychosocial support;Therapeutic Activities;Self  Care/advanced ADL retraining;Therapeutic Exercise;UE/LE Strength taining/ROM;UE/LE Coordination activities;Visual/perceptual remediation/compensation OT Self Feeding Anticipated Outcome(s): independent OT Basic Self-Care Anticipated Outcome(s): supervision OT Toileting Anticipated Outcome(s): supervision OT Bathroom Transfers Anticipated Outcome(s): supervision OT Recommendation Recommendations for Other Services: Neuropsych consult Patient destination: Home Follow Up Recommendations: Outpatient OT  Equipment Recommended: To be determined   OT Evaluation Precautions/Restrictions  Precautions Precautions: Fall Precaution Comments: L hemi (LUE>LLE). Mild L inattention Restrictions Weight Bearing Restrictions: No Vital Signs Therapy Vitals Pulse Rate: 62 BP: (!) 145/69 Pain Pain Assessment Pain Scale: 0-10 Pain Score: 0-No pain Pain Type: Acute pain Pain Location: Head Pain Descriptors / Indicators: Aching Pain Frequency: Intermittent Pain Onset: On-going Patients Stated Pain Goal: 0 Pain Intervention(s): Medication (See eMAR) Home Living/Prior Functioning Home Living Available Help at Discharge: Family (Wife's daughter also available PRN) Type of Home: House Home Access: Stairs to enter Technical brewer of Steps: 7 Entrance Stairs-Rails: Can reach both,Left,Right Home Layout: Two level,One level,Laundry or work area in basement Alternate Therapist, sports of Steps: Walks around the house to access basement (grass) Bathroom Shower/Tub: Gaffer  Lives With: Spouse Prior Function Level of Independence: Independent with gait,Independent with transfers,Independent with homemaking with ambulation,Independent with basic ADLs  Able to Take Stairs?: Yes Driving: Yes Vocation: On disability Comments: Wife manages finances Vision Baseline Vision/History: No visual deficits Patient Visual Report: No change from baseline Vision Assessment?: Yes Eye Alignment:  Within Functional Limits Ocular Range of Motion: Within Functional Limits Tracking/Visual Pursuits: Able to track stimulus in all quads without difficulty Visual Fields: No apparent deficits Perception  Perception: Within Functional Limits (very mild L inattention) Praxis Praxis: Intact Cognition Overall Cognitive Status: Impaired/Different from baseline Arousal/Alertness: Awake/alert Orientation Level: Person;Place;Situation Person: Oriented Place: Oriented Situation: Oriented Year: 2021 Month: March Day of Week: Incorrect Memory: Impaired Memory Impairment: Retrieval deficit;Decreased recall of new information Immediate Memory Recall: Sock;Blue;Bed Memory Recall Sock: Without Cue Memory Recall Blue: Without Cue Memory Recall Bed: With Cue Focused Attention: Appears intact Sustained Attention: Impaired Sustained Attention Impairment: Verbal complex;Functional complex Awareness: Impaired Awareness Impairment: Emergent impairment Problem Solving: Impaired Problem Solving Impairment: Verbal basic;Functional basic;Functional complex Safety/Judgment: Impaired Comments: Per chart review, pt with 2 falls in the hospital due to not calling staff for assistance Sensation Sensation Light Touch: Impaired by gross assessment Hot/Cold: Appears Intact Proprioception: Appears Intact Stereognosis: Not tested Additional Comments: Able to detect light touch but dimminshed sensation. LUE > LLE Coordination Gross Motor Movements are Fluid and Coordinated: No Fine Motor Movements are Fluid and Coordinated: No Coordination and Movement Description: Blocked movement patterns and effortful 2/2 L hemibody weakness (LUE>LLE). Finger Nose Finger Test: UTA on LUE due to weakness Heel Shin Test: Limited ability due to hip flexor weakness on LUE. No apparent dyscoordination with task Motor  Motor Motor: Hemiplegia Motor - Skilled Clinical Observations: L hemi - LUE>LLE  Trunk/Postural Assessment   Cervical Assessment Cervical Assessment: Within Functional Limits Thoracic Assessment Thoracic Assessment: Within Functional Limits Lumbar Assessment Lumbar Assessment: Exceptions to [facility] (posterior pelvic tilt) Postural Control Postural Control: Within Functional Limits  Balance Balance Balance Assessed: Yes Static Sitting Balance Static Sitting - Balance Support: Feet supported;No upper extremity supported Static Sitting - Level of Assistance: 5: Stand by assistance Dynamic Sitting Balance Dynamic Sitting - Balance Support: During functional activity;Feet supported;No upper extremity supported Dynamic Sitting - Level of Assistance: 4: Min Insurance risk surveyor Standing - Balance Support: No upper extremity supported Static Standing - Level of Assistance: 4: Min assist Dynamic Standing Balance Dynamic Standing - Balance Support: During functional activity;No upper extremity supported Dynamic Standing - Level of Assistance: 3: Mod assist Extremity/Trunk Assessment RUE Assessment RUE Assessment: Within Functional Limits LUE Assessment LUE Assessment: Exceptions to [facility] Active Range of Motion (AROM) Comments: no active scapular elevation, 10% of finger flexion for  grasp General Strength Comments: with support to arm, pt could resist for tricep ext and sh ext LUE Body System: Neuro Brunstrum levels for arm and hand: Arm;Hand Brunstrum level for arm: Stage II Synergy is developing Brunstrum level for hand: Stage II Synergy is developing LUE Tone LUE Tone: Mild  Care Tool Care Tool Self Care Eating   Eating Assist Level: Set up assist    Oral Care    Oral Care Assist Level: Set up assist    Bathing   Body parts bathed by patient: Chest;Abdomen;Left arm;Front perineal area;Right upper leg;Left upper leg;Face Body parts bathed by helper: Buttocks;Right lower leg;Left lower leg;Right arm   Assist Level: Moderate Assistance - Patient 50 - 74%    Upper Body  Dressing(including orthotics)   What is the patient wearing?: Pull over shirt   Assist Level: Moderate Assistance - Patient 50 - 74%    Lower Body Dressing (excluding footwear)   What is the patient wearing?: Incontinence brief;Pants Assist for lower body dressing: Moderate Assistance - Patient 50 - 74%    Putting on/Taking off footwear   What is the patient wearing?: Non-skid slipper socks;Ted hose Assist for footwear: Maximal Assistance - Patient 25 - 49%       Care Tool Toileting Toileting activity   Assist for toileting: Moderate Assistance - Patient 50 - 74%       Care Tool Transfers Sit to stand transfer   Sit to stand assist level: Minimal Assistance - Patient > 75%    Chair/bed transfer   Chair/bed transfer assist level: Minimal Assistance - Patient > 75%     Toilet transfer   Assist Level: Minimal Assistance - Patient > 75%     Care Tool Cognition Expression of Ideas and Wants Expression of Ideas and Wants: Some difficulty - exhibits some difficulty with expressing needs and ideas (e.g, some words or finishing thoughts) or speech is not clear   Understanding Verbal and Non-Verbal Content Understanding Verbal and Non-Verbal Content: Usually understands - understands most conversations, but misses some part/intent of message. Requires cues at times to understand   Memory/Recall Ability *first 3 days only Memory/Recall Ability *first 3 days only: That he or she is in a hospital/hospital unit;Current season    Refer to Care Plan for Long Term Goals  SHORT TERM GOAL WEEK 1 OT Short Term Goal 1 (Week 1): Pt will don a tshirt with min A. OT Short Term Goal 2 (Week 1): Pt will don pants over feet min A. OT Short Term Goal 3 (Week 1): Pt will pull pants over hips with CGA for balance. OT Short Term Goal 4 (Week 1): Pt will use a long handled sponge as needed for washing feet and R arm. OT Short Term Goal 5 (Week 1): Pt will be able to use L hand as a stabilizing A with  dressing tasks.  Recommendations for other services: Neuropsych   Skilled Therapeutic Intervention ADL ADL Eating: Set up Grooming: Setup Upper Body Bathing: Minimal assistance Where Assessed-Upper Body Bathing: Shower Lower Body Bathing: Moderate assistance Where Assessed-Lower Body Bathing: Shower Upper Body Dressing: Moderate assistance Where Assessed-Upper Body Dressing: Wheelchair Lower Body Dressing: Moderate assistance Where Assessed-Lower Body Dressing: Wheelchair Toileting: Moderate assistance Where Assessed-Toileting: Glass blower/designer: Psychiatric nurse Method: Arts development officer: Energy manager: Environmental education officer Method: Radiographer, therapeutic: Grab bars;Transfer tub bench Mobility  Bed Mobility Bed Mobility: Rolling Right;Rolling Left;Supine to Sit;Sit  to Supine Rolling Right: Minimal Assistance - Patient > 75% (for LUE management) Rolling Left: Supervision/Verbal cueing Supine to Sit: Minimal Assistance - Patient > 75% Sit to Supine: Minimal Assistance - Patient > 75% Transfers Sit to Stand: Minimal Assistance - Patient > 75% Stand to Sit: Minimal Assistance - Patient > 75%  Pt seen for initial evaluation and ADL training with a focus on balance, LUE management and hemidressing techniques. Pt participated well and did need cues for processing and direction following.  He is motivated and responded well to the cuing.  Overall mod A with goals of S. Pt completed toileting on regular toilet, shower and dressing.  Discussed role of OT, pt's goals and POC.  Pt agreed. He opted to sit in recliner. Chair alarm on and call bell in reach. Reviewed need to call for help.     Discharge Criteria: Patient will be discharged from OT if patient refuses treatment 3 consecutive times without medical reason, if treatment goals not met, if there is a change in medical status, if  patient makes no progress towards goals or if patient is discharged from hospital.  The above assessment, treatment plan, treatment alternatives and goals were discussed and mutually agreed upon: by patient  Stephen 06/22/2020, 1:00 PM

## 2020-06-22 NOTE — Progress Notes (Signed)
[place] PHYSICAL MEDICINE & REHABILITATION PROGRESS NOTE  Subjective/Complaints: Patient seen sitting up in bed this morning, working with therapies.  He states he slept well overnight.  He states he is ready to begin therapies.  ROS: Denies CP, SOB, N/V/D  Objective: Vital Signs: Blood pressure (!) 145/69, pulse 62, temperature 97.9 F (36.6 C), temperature source Oral, resp. rate 19, height 5' 7.5" (1.715 m), weight 93.4 kg, SpO2 97 %. No results found. Recent Labs    06/20/20 0458 06/22/20 0522  WBC 6.1 7.4  HGB 16.7 16.6  HCT 49.3 49.7  PLT 82* 89*   Recent Labs    06/22/20 0522  NA 137  K 4.1  CL 103  CO2 26  GLUCOSE 150*  BUN 20  CREATININE 1.07  CALCIUM 9.0    Intake/Output Summary (Last 24 hours) at 06/22/2020 1026 Last data filed at 06/22/2020 0735 Gross per 24 hour  Intake 720 ml  Output 1300 ml  Net -580 ml        Physical Exam: BP (!) 145/69   Pulse 62   Temp 97.9 F (36.6 C) (Oral)   Resp 19   Ht 5' 7.5" (1.715 m)   Wt 93.4 kg   SpO2 97%   BMI 31.76 kg/m  Constitutional: No distress . Vital signs reviewed. HENT: Normocephalic.  Atraumatic. Eyes: EOMI. No discharge. Cardiovascular: No JVD.  RRR. Respiratory: Normal effort.  No stridor.  Bilateral clear to auscultation. GI: BS +.  + Distended. Skin: Warm and dry.  Intact. Psych: Normal mood.  Normal behavior. Musc: No edema in extremities.  No tenderness in extremities. Neuro: Alert Makes eye contact with examiner.   Follows simple commands.   Fair insight and awareness. Motor: RUE/RLE: 5/5 proximal distal LLE: 5/5 proximal distal LUE: 2 --2 /5 proximal distal   Assessment/Plan: 1. Functional deficits which require 3+ hours per day of interdisciplinary therapy in a comprehensive inpatient rehab setting.  Physiatrist is providing close team supervision and 24 hour management of active medical problems listed below.  Physiatrist and rehab team continue to assess barriers to  discharge/monitor patient progress toward functional and medical goals   Care Tool:  Bathing              Bathing assist       Upper Body Dressing/Undressing Upper body dressing   What is the patient wearing?: Hospital gown only    Upper body assist Assist Level: Moderate Assistance - Patient 50 - 74%    Lower Body Dressing/Undressing Lower body dressing      What is the patient wearing?: Incontinence brief     Lower body assist Assist for lower body dressing: Moderate Assistance - Patient 50 - 74%     Toileting Toileting    Toileting assist Assist for toileting: Moderate Assistance - Patient 50 - 74% (Assisted with holding urinal. button/unbutton jeans)     Transfers Chair/bed transfer  Transfers assist     Chair/bed transfer assist level: Minimal Assistance - Patient > 75%     Locomotion Ambulation   Ambulation assist      Assist level: Moderate Assistance - Patient 50 - 74% Assistive device: No Device Max distance: 75ft   Walk 10 feet activity   Assist     Assist level: Moderate Assistance - Patient - 50 - 74% Assistive device: No Device   Walk 50 feet activity   Assist Walk 50 feet with 2 turns activity did not occur: Safety/medical concerns  Walk 150 feet activity   Assist Walk 150 feet activity did not occur: Safety/medical concerns         Walk 10 feet on uneven surface  activity   Assist Walk 10 feet on uneven surfaces activity did not occur: Safety/medical concerns         Wheelchair     Assist Will patient use wheelchair at discharge?: No   Wheelchair activity did not occur: N/A         Wheelchair 50 feet with 2 turns activity    Assist    Wheelchair 50 feet with 2 turns activity did not occur: N/A       Wheelchair 150 feet activity     Assist  Wheelchair 150 feet activity did not occur: N/A        Medical Problem List and Plan: 1.  Left upper extremity monoparesis and  slurred speech secondary to right corona radiata infarction likely secondary to small vessel disease as well as history of prior stroke 2017 with minimal residual weakness  Begin CIR evaluations 2.  Antithrombotics: -DVT/anticoagulation: Lovenox             -antiplatelet therapy: Aspirin 81 mg daily and Plavix 75 mg daily for 3 weeks total then aspirin alone 3. Pain Management: Tylenol as needed 4. Mood: Provide emotional support             -antipsychotic agents: N/A 5. Neuropsych: This patient is capable of making decisions on his own behalf. 6. Skin/Wound Care: Routine skin checks 7. Fluids/Electrolytes/Nutrition: Routine in and outs.               CMP ordered for tomorrow 8.  Hypertension.  Lisinopril 40 mg daily, Toprol-XL 75 mg daily.               Monitor with increased mobility 9.  Hyperlipidemia: Lipitor 10. Uncontrolled diabetes mellitus with hyperglycemia.  Hemoglobin A1c 8.3.  SSI.  Presently on Lantus insulin 12 units nightly.  Patient also on Glucotrol 10 mg twice daily as well as farxiga 10 mg daily prior to admission.  Resume as needed            Monitor with increased mobility 11.  History of tobacco use.  Provide counseling 12.  Thrombocytopenia              Platelets 89 on 5/5  Continue to monitor 13.  Hypoalbuminemia  Supplement initiated on 5/5  LOS: 1 days A FACE TO FACE EVALUATION WAS PERFORMED  Dalexa Gentz [name] 06/22/2020, 10:26 AM

## 2020-06-22 NOTE — Evaluation (Signed)
Speech Language Pathology Assessment and Plan  Patient Details  Name: Frank Luna MRN: [id number] Date of Birth: [birth year]  SLP Diagnosis: Cognitive Impairments    Today's Date: 06/22/2020 SLP Individual Time: 0900-0930 SLP Individual Time Calculation (min): 30 min   Hospital Problem: Principal Problem:   Acute cerebral infarction [facility]) Active Problems:   Small vessel disease, cerebrovascular   Hypoalbuminemia due to protein-calorie malnutrition ([place])   Uncontrolled type 2 diabetes mellitus with hyperglycemia ([place])  Past Medical History:  Past Medical History:  Diagnosis Date  . Diabetes ([place])   . Hypertension   . Stroke ([place])   . Thrombocytopenia ([place])    Past Surgical History: History reviewed. No pertinent surgical history.  Assessment / Plan / Recommendation  Frank Luna is a 60 year old right-handed male with history of diabetes mellitus, hypertension, tobacco abuse, thrombocytopenia, prior stroke 2017 with minimal residual weakness maintained on aspirin and Plavix.  History taken from chart review and patient.  Patient lives with spouse.  1 level home 5 steps to entry.  Independent prior to admission.  He presented on 06/17/2020 with acute left hemiparesis and dysarthria.  [facility] after CT unremarkable for acute intracranial process.  Patient left AMA returning with persistent symptoms.  MRI showed a 1 cm acute infarction in the right corona radiata white matter.  Extensive chronic small vessel ischemic changes elsewhere throughout the brain as well as incidental findings of bilateral mastoid effusions.  CT angiogram of the head showed no emergent large vessel occlusion.  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  Admission chemistries unremarkable except glucose 126, alcohol negative, hemoglobin A1c 8.3 urine drug screen negative.  Echocardiogram ejection fraction 60-65%.   No wall motion abnormalities.  Presently maintained on aspirin 81 mg daily and Plavix 75 mg daily for CVA prophylaxis x3 weeks then aspirin alone.  Subcutaneous Lovenox for DVT prophylaxis.  Tolerating a regular consistency diet.  Due to patient's left monoparesis and slurred speech he was admitted for a comprehensive rehab program.  Please see preadmission assessment from earlier today as well.  Clinical Impression Patient particiapted in initial portion of evaluation and was oriented to time, place, situation and was able to recall and describe some of tasks completed during PT evaluation. He was approximately 60% accurate for answering yes/no delayed recall questions after SLP read aloud a short paragraph long story. As SLP was introducing the next task (divergent naming) he then said he did not want to participate any further. SLP discussed with patient that although he can choose, completing the evaluation was recommended in order to determine cognitive areas (memory, problem solving, etc) to target. Patient continued to decline and said that he was just concerned about his physical impairments. Plan is for SLP to not initiate therapy at this time, however if PT, OT noticing that cognitive limitations are impacting their therapy, then ST services could be reconsulted.  Skilled Therapeutic Interventions          Cognitive-linguistic, speech evaluation  SLP Assessment       Recommendations  Recommendations for Other Services: Neuropsych consult Patient destination: Home Follow up Recommendations: None Equipment Recommended: None recommended by SLP    SLP Frequency   N/A  SLP Duration  SLP Intensity  SLP Treatment/Interventions  N/A   N/A    N/A   Pain Pain Assessment Pain Scale: 0-10 Pain Score: 0-No pain  Prior Functioning Cognitive/Linguistic Baseline: Information not available Type  of Home: House  Lives With: Spouse Available Help at Discharge: Family Vocation: On  disability  SLP Evaluation Cognition Overall Cognitive Status: Difficult to assess Arousal/Alertness: Awake/alert Orientation Level: Oriented X4 Attention: Sustained Sustained Attention: Impaired Sustained Attention Impairment: Verbal complex Memory: Impaired Memory Impairment: Retrieval deficit;Decreased recall of new information Immediate Memory Recall: Frank Luna;Frank Luna;Frank Luna Memory Recall Frank Luna: Without Cue Memory Recall Frank Luna: Without Cue Memory Recall Frank Luna: With Cue Awareness: Impaired Awareness Impairment: Emergent impairment Problem Solving: Impaired Problem Solving Impairment: Verbal basic;Functional basic;Functional complex Safety/Judgment: Impaired Comments: Per chart review, pt with 2 falls in the hospital due to not calling staff for assistance  Comprehension Auditory Comprehension Overall Auditory Comprehension: Appears within functional limits for tasks assessed Expression Expression Primary Mode of Expression: Verbal Verbal Expression Overall Verbal Expression: Appears within functional limits for tasks assessed Oral Motor Oral Motor/Sensory Function Overall Oral Motor/Sensory Function: Mild impairment Facial ROM: Within Functional Limits Facial Symmetry: Abnormal symmetry left Facial Strength: Within Functional Limits Facial Sensation: Reduced left Lingual ROM: Within Functional Limits Lingual Symmetry: Within Functional Limits Lingual Strength: Within Functional Limits Motor Speech Overall Motor Speech: Appears within functional limits for tasks assessed Respiration: Within functional limits Phonation: Normal Resonance: Within functional limits Articulation: Within functional limitis Intelligibility: Intelligible Motor Planning: Witnin functional limits Motor Speech Errors: Not applicable  Care Tool Care Tool Cognition Expression of Ideas and Wants Expression of Ideas and Wants: Some difficulty - exhibits some difficulty with expressing needs and ideas (e.g,  some words or finishing thoughts) or speech is not clear   Understanding Verbal and Non-Verbal Content Understanding Verbal and Non-Verbal Content: Usually understands - understands most conversations, but misses some part/intent of message. Requires cues at times to understand   Memory/Recall Ability *first 3 days only Memory/Recall Ability *first 3 days only: That he or she is in a hospital/hospital unit;Current season      Short Term Goals: No short term goals set  Refer to Care Plan for Long Term Goals  Recommendations for other services: Neuropsych  Discharge Criteria: Patient will be discharged from SLP if patient refuses treatment 3 consecutive times without medical reason, if treatment goals not met, if there is a change in medical status, if patient makes no progress towards goals or if patient is discharged from hospital.  The above assessment, treatment plan, treatment alternatives and goals were discussed and mutually agreed upon: by patient   Sonia Baller, MA, CCC-SLP Speech Therapy

## 2020-06-23 DIAGNOSIS — I639 Cerebral infarction, unspecified: Secondary | ICD-10-CM

## 2020-06-23 DIAGNOSIS — G8320 Monoplegia of upper limb affecting unspecified side: Secondary | ICD-10-CM

## 2020-06-23 LAB — GLUCOSE, CAPILLARY
Glucose-Capillary: 187 mg/dL — ABNORMAL HIGH (ref 70–99)
Glucose-Capillary: 203 mg/dL — ABNORMAL HIGH (ref 70–99)
Glucose-Capillary: 138 mg/dL — ABNORMAL HIGH (ref 70–99)
Glucose-Capillary: 157 mg/dL — ABNORMAL HIGH (ref 70–99)

## 2020-06-23 NOTE — Progress Notes (Addendum)
[place] PHYSICAL MEDICINE & REHABILITATION PROGRESS NOTE  Subjective/Complaints: Patient seen sitting up in his chair this AM.  He states he slept well overnight.  He states he is significantly better and is going home.  He states his wife is coming to pick him up. When educated patient on safety and lack of appropriateness, patient states that he is going to go home "no matter what".  He states that he was "touched by the Lord" last night and is doing better.  However, patient continues to have significant deficits in the left upper extremity.  Discussed patient's left participation with therapies yesterday.  ROS: Denies CP, SOB, N/V/D  Objective: Vital Signs: Blood pressure (!) 142/68, pulse 60, temperature 98.2 F (36.8 C), temperature source Oral, resp. rate 16, height 5' 7.5" (1.715 m), weight 93.4 kg, SpO2 96 %. No results found. Recent Labs    06/22/20 0522  WBC 7.4  HGB 16.6  HCT 49.7  PLT 89*   Recent Labs    06/22/20 0522  NA 137  K 4.1  CL 103  CO2 26  GLUCOSE 150*  BUN 20  CREATININE 1.07  CALCIUM 9.0    Intake/Output Summary (Last 24 hours) at 06/23/2020 1019 Last data filed at 06/23/2020 0900 Gross per 24 hour  Intake 586 ml  Output 325 ml  Net 261 ml        Physical Exam: BP (!) 142/68   Pulse 60   Temp 98.2 F (36.8 C) (Oral)   Resp 16   Ht 5' 7.5" (1.715 m)   Wt 93.4 kg   SpO2 96%   BMI 31.76 kg/m  Constitutional: No distress . Vital signs reviewed. HENT: Normocephalic.  Atraumatic. Eyes: EOMI. No discharge. Cardiovascular: No JVD.  RRR. Respiratory: Normal effort.  No stridor.  Bilateral clear to auscultation. GI: Non-distended.  BS +. Skin: Warm and dry.  Intact. Psych: Normal mood.  Normal behavior. Musc: No edema in extremities.  No tenderness in extremities. Neuro: Alert Makes eye contact with examiner.   Follows simple commands.   Fair insight and awareness. Motor: RUE/RLE: 5/5 proximal distal LLE: 5/5 proximal distal LUE:  Shoulder abduction 2+/5, elbow flexion/extension 2/5, distally 2-/5   Assessment/Plan: 1. Functional deficits which require 3+ hours per day of interdisciplinary therapy in a comprehensive inpatient rehab setting.  Physiatrist is providing close team supervision and 24 hour management of active medical problems listed below.  Physiatrist and rehab team continue to assess barriers to discharge/monitor patient progress toward functional and medical goals   Care Tool:  Bathing    Body parts bathed by patient: Chest,Abdomen,Left arm,Front perineal area,Right upper leg,Left upper leg,Face   Body parts bathed by helper: Buttocks,Right lower leg,Left lower leg,Right arm     Bathing assist Assist Level: Moderate Assistance - Patient 50 - 74%     Upper Body Dressing/Undressing Upper body dressing   What is the patient wearing?: Pull over shirt    Upper body assist Assist Level: Moderate Assistance - Patient 50 - 74%    Lower Body Dressing/Undressing Lower body dressing      What is the patient wearing?: Underwear/pull up,Pants     Lower body assist Assist for lower body dressing: Moderate Assistance - Patient 50 - 74%     Toileting Toileting    Toileting assist Assist for toileting: Minimal Assistance - Patient > 75%     Transfers Chair/bed transfer  Transfers assist     Chair/bed transfer assist level: Minimal Assistance - Patient >  75%     Locomotion Ambulation   Ambulation assist      Assist level: Moderate Assistance - Patient 50 - 74% Assistive device: No Device Max distance: 41ft   Walk 10 feet activity   Assist     Assist level: Moderate Assistance - Patient - 50 - 74% Assistive device: No Device   Walk 50 feet activity   Assist Walk 50 feet with 2 turns activity did not occur: Safety/medical concerns         Walk 150 feet activity   Assist Walk 150 feet activity did not occur: Safety/medical concerns         Walk 10 feet on  uneven surface  activity   Assist Walk 10 feet on uneven surfaces activity did not occur: Safety/medical concerns         Wheelchair     Assist Will patient use wheelchair at discharge?: No   Wheelchair activity did not occur: N/A         Wheelchair 50 feet with 2 turns activity    Assist    Wheelchair 50 feet with 2 turns activity did not occur: N/A       Wheelchair 150 feet activity     Assist  Wheelchair 150 feet activity did not occur: N/A        Medical Problem List and Plan: 1.  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  Went back in the room later and discussed with wife, who states that patient will not be leaving today.  Patient and agreement to stay. 2.  Antithrombotics: -DVT/anticoagulation: Lovenox             -antiplatelet therapy: Aspirin 81 mg daily and Plavix 75 mg daily for 3 weeks total then aspirin alone 3. Pain Management: Tylenol as needed 4. Mood: Provide emotional support             -antipsychotic agents: N/A 5. Neuropsych: This patient is capable of making decisions on his own behalf. 6. Skin/Wound Care: Routine skin checks 7. Fluids/Electrolytes/Nutrition: Routine in and outs.               CMP ordered for tomorrow 8.  Hypertension.  Lisinopril 40 mg daily, Toprol-XL 75 mg daily.    Relatively controlled on 5/6             Monitor with increased mobility 9.  Hyperlipidemia: Lipitor 10. Uncontrolled diabetes mellitus with hyperglycemia.  Hemoglobin A1c 8.3.  SSI.  Presently on Lantus insulin 12 units nightly.  Patient also on Glucotrol 10 mg twice daily as well as farxiga 10 mg daily prior to admission.  Resume as needed  Remains elevated on 5/6, will not make further adjustments today, given recent  medication additions.            Monitor with increased mobility 11.  History of tobacco use.  Provide counseling 12.  Thrombocytopenia              Platelets 89 on 5/5, labs ordered for Monday  Continue to monitor 13.  Hypoalbuminemia  Supplement initiated on 5/5  LOS: 2 days A FACE TO FACE EVALUATION WAS PERFORMED  Careem Yasui [name] 06/23/2020, 10:19 AM

## 2020-06-23 NOTE — Progress Notes (Signed)
Physical Therapy Session Note  Patient Details  Name: Frank Luna MRN: [id number] Date of Birth: 29-Jan-1961  Today's Date: 06/23/2020 PT Individual Time: 0900-1010 PT Individual Time Calculation (min): 70 min   Short Term Goals: Week 1:  PT Short Term Goal 1 (Week 1): Pt will complete bed mobility with CGA and no hospital bed features PT Short Term Goal 2 (Week 1): Pt will complete bed<>chair transfers with CGA and LRAD PT Short Term Goal 3 (Week 1): Pt will ambulated 188ft with minA and LRAD PT Short Term Goal 4 (Week 1): Pt will initiate stair training  Skilled Therapeutic Interventions/Progress Updates:    Pt received sitting in recliner at start of session with chair alarm activated. Pt agreeable to session with no reports of pain. Pt reports he got up this morning and walked to the bathroom by himself without staff assist. Despite thorough education that was provided yesterday regarding "call dont fall policy." Pt continued to show poor safety awareness and recall.  Stand<>pivot transfer with CGA from recliner to w/c with cues for safety approach and hand placement. W/c transport for time management to main rehab gym.  Stand<>pivot with CGA to mat table with increased difficulty managing L foot placement. Performed repeated sit<>stands, 2x10, with CGA and no AD from low mat table height - pt favors RLE and needs cues for equal weight shift to LLE. (improved during 2nd set)  Gait training 151ft with minA and RW (LUE hand splint on walker) on level ground with turns incorporated - cues needed for L foot clearance and improving L step length as he tends to drag and take short steps causing latera LOB's that need minA for correction. Short distance gait training 35ft + 21f + 76ft with min/modA and no AD on level ground - cues for corrections, stabilizing, and safety awareness.  Stair training, navigated up/down 3x4 (seated rest breaks provided) steps with mod assist for ascent and minA for  descent. and 1 handrail on the R. Pt needs continuous cues for foot placement and foot sequencing (stepping up with R and stepping down with L).   W/c transport for time management to ortho gym. Stand<>pivot transfer with minA to Nustep (cues for safety and hand placement). Completed Nustep for 10 minutes at workload 4, using BLE and RUE (did not use LUE as unable to maintain grip). Cues for maintaining 45-50 steps/minute cadence which he was able to achieve and maintain. Cues also needed for LLE knee alignment to neutral due to abduction tendencies. Needed to place gait belt around distal femurs to improve L knee alignment during last ~5 minutes.   Pt remained seated in recliner with chair alarm on, needs within reach at end of session  Therapy Documentation Precautions:  Precautions Precautions: Fall Precaution Comments: L hemi (LUE>LLE). Mild L inattention Restrictions Weight Bearing Restrictions: No General:    Therapy/Group: Individual Therapy  Roper Tolson P Elmin Wiederholt PT 06/23/2020, 7:36 AM

## 2020-06-23 NOTE — Progress Notes (Signed)
Occupational Therapy Session Note  Patient Details  Name: Frank Luna MRN: [id number] Date of Birth: [birth year]  Today's Date: 06/23/2020 OT Individual Time: 4332-9518 OT Individual Time Calculation (min): 60 min    Short Term Goals: Week 1:  OT Short Term Goal 1 (Week 1): Pt will don a tshirt with min A. OT Short Term Goal 2 (Week 1): Pt will don pants over feet min A. OT Short Term Goal 3 (Week 1): Pt will pull pants over hips with CGA for balance. OT Short Term Goal 4 (Week 1): Pt will use a long handled sponge as needed for washing feet and R arm. OT Short Term Goal 5 (Week 1): Pt will be able to use L hand as a stabilizing A with dressing tasks.  Skilled Therapeutic Interventions/Progress Updates:    Pt received in recliner eating an egg and cheese croissant with his wife and mother present.  Pt was having difficulty eating it without getting crumbs all over his face and shirt.    Pt stood to RW with CGA and used walker with CGA to walk 10 ft to wc at sink but did need MOD CUES to safely pivot, safe placement of walker and adequate positioning of feet/hips prior to sitting. Pt got upset "I will NOT fall". Pt washed face and brushed teeth sitting at sink with set up.  Pt stated he was going home today "hell or [place]" and he would call a friend even if his wife refused to take him. His wife and mother began to get upset and crying and constantly telling him that he has to stay here to get better, despite my many attempts to have them stop talking.  Pt kept getting agitated, and grabbed the footboard of the bed and yanked himself in wc towards the bed and ended up smacking his L knee into bed.  (NT also present and she agreed to fill out safety zone).  As a result his knee was quite bruised and scrapped. Pt continuing to yell at family so I used distraction techniques to get him out of the room right away.  Once out of the room, he was very calm with me and participated well.  Pt taken to  therapy room and he transferred to mat with CGA. Focused on LUE NMR, he has 10 lbs of grasp (vs 75 on R) but no finger extension.  Used Empi estim unit on small muscle atrophy setting at intensity 40 for 10 min with good response of finger extensors. integrated grasp and release exercises with cones. Pt participated very well and very attentive to his hand. Worked on a/arom of L shoulder with minimal activity for pushing and pulling a barstool away.    Once back in room, pt wanted to sit in recliner.  Because he told us he got up by himself earlier today, I told him he needed the belt alarm on.  Pt got agitated and refused. " I will walk out right now if any alarms are on me".  I told him I was not allowed to leave him without a form of safety alarm and that I would have to at least use the chair pad.  "well go ahead, but I am still leaving". Pt was then trying to call a friend to pick him up.    Chair pad alarm on and call light in reach.   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 Mild L inattention Restrictions Weight Bearing Restrictions:  No  Pain: Pain Assessment Pain Score: 0-No pain ADL: ADL Eating: Set up Grooming: Setup Upper Body Bathing: Minimal assistance Where Assessed-Upper Body Bathing: Shower Lower Body Bathing: Moderate assistance Where Assessed-Lower Body Bathing: Shower Upper Body Dressing: Moderate assistance Where Assessed-Upper Body Dressing: Wheelchair Lower Body Dressing: Moderate assistance Where Assessed-Lower Body Dressing: Wheelchair Toileting: Moderate assistance Where Assessed-Toileting: Teacher, adult education: Curator Method: Surveyor, minerals: Acupuncturist: Insurance underwriter Method: Warden/ranger: Grab bars,Transfer tub bench   Therapy/Group: Individual  Therapy  Frank Luna 06/23/2020, 12:48 PM

## 2020-06-23 NOTE — Progress Notes (Signed)
Patient got up without assistance in the morning. OT gave education about need to call for assistance.   At end of shift again discussed with patient need to call for assistance. Told him was not going to place telesitter at this time because felt he had been compliant with calling all day but if he continued to get up would need it for safety. He agreed to call for needs. Reported to night shift for monitoring.

## 2020-06-23 NOTE — Progress Notes (Signed)
Occupational Therapy Session Note  Patient Details  Name: Frank Luna MRN: [id number] Date of Birth: Sep 30, 1960  Today's Date: 06/23/2020 OT Individual Time: 4599-7741 OT Individual Time Calculation (min): 55 min    Short Term Goals: Week 1:  OT Short Term Goal 1 (Week 1): Pt will don a tshirt with min A. OT Short Term Goal 2 (Week 1): Pt will don pants over feet min A. OT Short Term Goal 3 (Week 1): Pt will pull pants over hips with CGA for balance. OT Short Term Goal 4 (Week 1): Pt will use a long handled sponge as needed for washing feet and R arm. OT Short Term Goal 5 (Week 1): Pt will be able to use L hand as a stabilizing A with dressing tasks.  Skilled Therapeutic Interventions/Progress Updates:   Pt received in recliner in room and consented to OT tx. Session focused on functional transfers, weight bearing for LUE NMR, and standing tolerance tasks. Pt req min A-CGA for SPTs with no device. Therapist blocked L elbow while seated  Unsupported and instructed to weight bear and push through LUE with good carryover. Completed 10x for 10 seconds each. Pt instructed in dynamic sitting balance tasks with functional reaching while sitting unsupported with close SUP- CGA. Instructed in standing task at table for improved tolerance to upright positioning, instructed in functional tasks while standing, weightbearing through L arm with therapist blocking assist. Education on safety throughout session, pt agreeable to POC and to shower next session. After tx, pt left up in recliner with alarm on and all needs met.   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 Mild L inattention Restrictions Weight Bearing Restrictions: No Pain: Pain Assessment Pain Score: 0-No pain   Therapy/Group: Individual Therapy  [name] 06/23/2020, 2:53 PM

## 2020-06-24 LAB — GLUCOSE, CAPILLARY
Glucose-Capillary: 164 mg/dL — ABNORMAL HIGH (ref 70–99)
Glucose-Capillary: 144 mg/dL — ABNORMAL HIGH (ref 70–99)
Glucose-Capillary: 130 mg/dL — ABNORMAL HIGH (ref 70–99)
Glucose-Capillary: 192 mg/dL — ABNORMAL HIGH (ref 70–99)

## 2020-06-24 NOTE — Progress Notes (Signed)
Occupational Therapy Session Note  Patient Details  Name: Frank Luna MRN: [id number] Date of Birth: August 12, 1960  Today's Date: 06/24/2020 OT Individual Time: 1100-1200 and 1300-1345 OT Individual Time Calculation (min): 60 min and 45 min   Short Term Goals: Week 1:  OT Short Term Goal 1 (Week 1): Pt will don a tshirt with min A. OT Short Term Goal 2 (Week 1): Pt will don pants over feet min A. OT Short Term Goal 3 (Week 1): Pt will pull pants over hips with CGA for balance. OT Short Term Goal 4 (Week 1): Pt will use a long handled sponge as needed for washing feet and R arm. OT Short Term Goal 5 (Week 1): Pt will be able to use L hand as a stabilizing A with dressing tasks.  Skilled Therapeutic Interventions/Progress Updates:    Visit 1:  No c/o pain Pt seen this session for ADL training with a focus on adaptive techniques, balance, L side awareness and coordination. Pt completed shower and dressing:        Eating: Set up Grooming: Setup Upper Body Bathing: Contact guard Where Assessed-Upper Body Bathing: Shower Lower Body Bathing: Contact guard Where Assessed-Lower Body Bathing: Shower Upper Body Dressing: Contact guard Where Assessed-Upper Body Dressing: Chair Lower Body Dressing: Minimal assistance Where Assessed-Lower Body Dressing: Chair Toileting: Moderate assistance Where Assessed-Toileting: Glass blower/designer: Psychiatric nurse Method: Counselling psychologist: Energy manager: Environmental education officer Method: Heritage manager: Grab bars,Transfer tub bench   Pt participated extremely well and followed directions well with hemidressing techniques and use of long sponge for his feet and to reach under his right arm.   He did need cues for safe L foot placement with sit to stands, but was cautious with L foot with ambulation and turning in transfers. Sat in recliner and worked on  a/arom facilitation of LUE  With lightwt dowel bar needing significant A to move arm.   Grasp strengthening with foam block.  Pt resting in recliner with all needs met and chair pad alarm on.   Visit 2:      Pt received in recliner agreeable to therapy.  Pt used RW with CGA to ambulate in and out of bathroom and then was able to complete toilet transfers, toileting and sit to stand with Supervision.  Stood at sink to wash hands.  Pt taken to gym via wc to work on [place] with estim with empi unit on forearm for wrist and finger extension for 15 min while at the same time working on shoulder AROM using arm skateboard on table.  Pt continued to work with arm skateboard attending well to LUE.  He is developing active movement but not able to integrate into function well. Pt taken back to room and opted to stay in wc. Chair alarm set and all needs met.    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 Mild L inattention Restrictions Weight Bearing Restrictions: No    Vital Signs: Therapy Vitals Pulse Rate: 74    Therapy/Group: Individual Therapy  [place] 06/24/2020, 12:48 PM

## 2020-06-24 NOTE — Progress Notes (Signed)
Physical Therapy Session Note  Patient Details  Name: Frank Luna MRN: [id number] Date of Birth: September 05, 1960  Today's Date: 06/24/2020 PT Individual Time: 5462-7035 + 0093-8182 PT Individual Time Calculation (min): 55 min  + 40 min  Short Term Goals: Week 1:  PT Short Term Goal 1 (Week 1): Pt will complete bed mobility with CGA and no hospital bed features PT Short Term Goal 2 (Week 1): Pt will complete bed<>chair transfers with CGA and LRAD PT Short Term Goal 3 (Week 1): Pt will ambulated 182ft with minA and LRAD PT Short Term Goal 4 (Week 1): Pt will initiate stair training  Skilled Therapeutic Interventions/Progress Updates:     1st session: Pt greeted seated in recliner at start of session, agreeable to PT tx. No reports of pain but does report LUE tingling that is on/off. Reports good nights rest as well. Stand<>pivot transfer with CGA from recliner to w/c with cues needed for awareness of L foot placement.  W/c transport for time management to main rehab gym and stand<>pivot with CGA to mat table in similar fashion.  Gait training 122ft with minA and RW - demo's decreased L hip/knee flexion during swing and poor L foot clearance resulting in intermittent LOB requiring minA for correction. Pt with ability to correct at times but not consistently.  Completed seated and supine there-ex: -2x15 LAQ bilaterally with 4# ankle weight -1x10 bridges (needs assist for stabilizing LLE in neutral position due to "flopping" outwards into abduction) -2x5 heel slides bilaterally with 4# ankle weight (assist for stabilizing LLE in neutral) -2x5 straight leg hip abduction bilaterally with 4# ankle weight (AAROM for end range on LLE) -3x10 repeated sit<>stands with supervision - cues for equal weight shift on LLE as he favors RLE - provided mirror for visual feedback and worked on placing RLE forward and LLE backwards to facilitate LLE weight bearing  Stan<>pivot with minA and no AD back to his w/c  from mat table and wheeled to room for time management. Stand<>pivot back to recliner from w/c with minA. Chair alarm on and needs within reach (including urinal). Reminded patient of "call don't fall" policy as he is a high falls risk. Pt voiced understanding  2nd session: Handoff of care from NT as pt on bathroom. Pt sit<>stand from toilet to CGA and minA for balance as he pulls pants over hips. Stand<>Pivot transfer with minA back to w/c and wheeled to sink for him to perform hand hygine.  W/c transport to main rehab gym for time management and stand<>pivot with CGA and no AD to mat table - cues needed for L foot awareness.  Gait training 2x161ft with minA and RW - continues to demonstrate poor L foot clearance with decreased L knee and hip flexion in swing. Cues for "heel-toe" progressions and increasing awareness of L foot placement - after ~64ft of gait, quality begins to fade and increased L foot drag noted.  Completed repeated sit<>stands with no support and supervision. Placed 2inch block under L foot to promote weight bearing and equal weight shift but pt continues to favor RLE during transitions.   Pt reporting that he's going home on Monday (5/9) and that his wife is coming to pick him up. Educated patient on PT POC, supervision goals, and barriers to DC as he is not safe at this time due to high falls risk from his stroke. Pt continued to say he's going home on Monday no matter what and he began to get agitated so needed redirection.  Stand<>pivot with CGA back to his w/c and returned to his room where he pivoted back to his bed with CGA and use of bed rail. Sit>supine with supervision and pt remained supine in bed with bed alarm on and needs within reach. Informed Charge RN of pt's request to leave Monday and also his poor safety awareness with impulsivity of standing and going to the bathroom by himself. Recommended Telesitter to [employer].   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 Mild L inattention Restrictions Weight Bearing Restrictions: No General:    Therapy/Group: Individual Therapy  Brewster Wolters P Reyhan Moronta PT 06/24/2020, 7:31 AM

## 2020-06-24 NOTE — Progress Notes (Signed)
[facility] PHYSICAL MEDICINE & REHABILITATION PROGRESS NOTE  Subjective/Complaints:  Pt sitting in bedside chair this AM- said got up using w/c to bathroom, on his OWN this AM- and transferred to toilet, again, without any supervision. "had to go" and was shift change, and "no one came".   This is recurring theme, did again yesterday.  Admits dying for cigarette, but doesn't want another stroke, so using patch.   Admits LBM this AM- very little- mainly gas- prior to that was a few days ago-  Still wants to leave, but willing to stay for a few minutes.   Now on insulin, which he likes better.   ROS:  Pt denies SOB, abd pain, CP, N/V/C/D, and vision changes   Objective: Vital Signs: Blood pressure (!) 143/62, pulse (!) 55, temperature 97.9 F (36.6 C), temperature source Oral, resp. rate 18, height 5' 7.5" (1.715 m), weight 93.4 kg, SpO2 97 %. No results found. Recent Labs    06/22/20 0522  WBC 7.4  HGB 16.6  HCT 49.7  PLT 89*   Recent Labs    06/22/20 0522  NA 137  K 4.1  CL 103  CO2 26  GLUCOSE 150*  BUN 20  CREATININE 1.07  CALCIUM 9.0    Intake/Output Summary (Last 24 hours) at 06/24/2020 1438 Last data filed at 06/24/2020 1032 Gross per 24 hour  Intake 240 ml  Output 1325 ml  Net -1085 ml        Physical Exam: BP (!) 143/62 (BP Location: Right Arm)   Pulse (!) 55   Temp 97.9 F (36.6 C) (Oral)   Resp 18   Ht 5' 7.5" (1.715 m)   Wt 93.4 kg   SpO2 97%   BMI 31.76 kg/m    General: awake, alert, appropriate, sitting up in bedside chair, w/c across the room; NAD HENT: conjugate gaze; oropharynx moist CV: regular rhythm; bradycardic rate; no JVD Pulmonary: CTA B/L; no W/R/R- good air movement- decreased slightly at bases GI: soft, NT, ND, (+)BS- slightly hyperactive Psychiatric: cordial, but didn't see the issue with getting up to bathroom Neurological: alert, but poor awareness of deficits.   Motor: RUE/RLE: 5/5 proximal distal LLE: 5/5 proximal  distal LUE: Shoulder abduction 2+/5, elbow flexion/extension 2/5, distally 2-/5   Assessment/Plan: 1. Functional deficits which require 3+ hours per day of interdisciplinary therapy in a comprehensive inpatient rehab setting.  Physiatrist is providing close team supervision and 24 hour management of active medical problems listed below.  Physiatrist and rehab team continue to assess barriers to discharge/monitor patient progress toward functional and medical goals   Care Tool:  Bathing    Body parts bathed by patient: Chest,Abdomen,Left arm,Front perineal area,Right upper leg,Left upper leg,Face,Right arm,Buttocks,Right lower leg,Left lower leg   Body parts bathed by helper: Buttocks,Right lower leg,Left lower leg,Right arm     Bathing assist Assist Level: Contact Guard/Touching assist     Upper Body Dressing/Undressing Upper body dressing   What is the patient wearing?: Pull over shirt    Upper body assist Assist Level: Contact Guard/Touching assist    Lower Body Dressing/Undressing Lower body dressing      What is the patient wearing?: Underwear/pull up,Pants     Lower body assist Assist for lower body dressing: Contact Guard/Touching assist     Toileting Toileting    Toileting assist Assist for toileting: Minimal Assistance - Patient > 75%     Transfers Chair/bed transfer  Transfers assist     Chair/bed transfer assist  level: Contact Guard/Touching assist     Locomotion Ambulation   Ambulation assist      Assist level: Minimal Assistance - Patient > 75% Assistive device: Walker-rolling Max distance: 140ft   Walk 10 feet activity   Assist     Assist level: Minimal Assistance - Patient > 75% Assistive device: Walker-rolling   Walk 50 feet activity   Assist Walk 50 feet with 2 turns activity did not occur: Safety/medical concerns  Assist level: Minimal Assistance - Patient > 75% Assistive device: Walker-rolling    Walk 150 feet  activity   Assist Walk 150 feet activity did not occur: Safety/medical concerns  Assist level: Minimal Assistance - Patient > 75% Assistive device: Walker-rolling    Walk 10 feet on uneven surface  activity   Assist Walk 10 feet on uneven surfaces activity did not occur: Safety/medical concerns         Wheelchair     Assist Will patient use wheelchair at discharge?: No   Wheelchair activity did not occur: N/A         Wheelchair 50 feet with 2 turns activity    Assist    Wheelchair 50 feet with 2 turns activity did not occur: N/A       Wheelchair 150 feet activity     Assist  Wheelchair 150 feet activity did not occur: N/A        Medical Problem List and Plan: 1.  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  Went back in the room later and discussed with wife, who states that patient will not be leaving today.  Patient and agreement to stay.  -con't PT, OT and SLP-  2.  Antithrombotics: -DVT/anticoagulation: Lovenox             -antiplatelet therapy: Aspirin 81 mg daily and Plavix 75 mg daily for 3 weeks total then aspirin alone 3. Pain Management: Tylenol as needed 4. Mood: Provide emotional support             -antipsychotic agents: N/A 5. Neuropsych: This patient is? capable of making decisions on his own behalf. 6. Skin/Wound Care: Routine skin checks 7. Fluids/Electrolytes/Nutrition: Routine in and outs.               CMP ordered for tomorrow 8.  Hypertension.  Lisinopril 40 mg daily, Toprol-XL 75 mg daily.    Relatively controlled on 5/6  5/7- slightly elevated today- 143/62- con't regimen             Monitor with increased mobility 9.  Hyperlipidemia: Lipitor 10. Uncontrolled diabetes mellitus with hyperglycemia.   Hemoglobin A1c 8.3.  SSI.  Presently on Lantus insulin 12 units nightly.  Patient also on Glucotrol 10 mg twice daily as well as farxiga 10 mg daily prior to admission.  Resume as needed  Remains elevated on 5/6, will not make further adjustments today, given recent medication additions.  5/7- pt said likes insulin "better"- doesn't like Metformin-             Monitor with increased mobility 11.  History of tobacco use.  Provide counseling 12.  Thrombocytopenia              Platelets 89 on 5/5, labs ordered for Monday  Continue to monitor 13.  Hypoalbuminemia  Supplement initiated on 5/5 14. Poor safety awareness  5/7- will d/w nursing if needs telesitter?  LOS: 3 days A FACE TO FACE EVALUATION WAS PERFORMED  Frank Luna 06/24/2020, 2:38 PM

## 2020-06-25 ENCOUNTER — Inpatient Hospital Stay (HOSPITAL_COMMUNITY): Payer: Medicaid Other

## 2020-06-25 LAB — GLUCOSE, CAPILLARY
Glucose-Capillary: 151 mg/dL — ABNORMAL HIGH (ref 70–99)
Glucose-Capillary: 139 mg/dL — ABNORMAL HIGH (ref 70–99)
Glucose-Capillary: 132 mg/dL — ABNORMAL HIGH (ref 70–99)
Glucose-Capillary: 175 mg/dL — ABNORMAL HIGH (ref 70–99)

## 2020-06-25 NOTE — Progress Notes (Signed)
[facility] PHYSICAL MEDICINE & REHABILITATION PROGRESS NOTE  Subjective/Complaints:   Pt reports needs 2 diet cokes and to go to bathroom- waited for NT to come who I called.  Also then got called for new L facial numbness- that was resolving- spoke with Neurology- since pt noticed it during washing his face- could have occurred earlier, will get MRI to make sure stroke hasn't extended.   MRI of brain pending. .   ROS:   Pt denies SOB, abd pain, CP, N/V/C/D, and vision changes   Objective: Vital Signs: Blood pressure 140/81, pulse 65, temperature 97.8 F (36.6 C), temperature source Oral, resp. rate 18, height 5' 7.5" (1.715 m), weight 93.4 kg, SpO2 99 %. No results found. No results for input(s): WBC, HGB, HCT, PLT in the last 72 hours. No results for input(s): NA, K, CL, CO2, GLUCOSE, BUN, CREATININE, CALCIUM in the last 72 hours.  Intake/Output Summary (Last 24 hours) at 06/25/2020 1007 Last data filed at 06/24/2020 1843 Gross per 24 hour  Intake 240 ml  Output 250 ml  Net -10 ml        Physical Exam: BP 140/81 (BP Location: Right Arm)   Pulse 65   Temp 97.8 F (36.6 C) (Oral)   Resp 18   Ht 5' 7.5" (1.715 m)   Wt 93.4 kg   SpO2 99%   BMI 31.76 kg/m     General: awake, alert, sitting up in bedside chair, asking to go to bathroom; NAD HENT: conjugate gaze; oropharynx moist; L facial numbness- is improving/improved CV: regular rate; no JVD Pulmonary: CTA B/L; no W/R/R- good air movement GI: soft, NT, ND, (+)BS Psychiatric: more appropriate this AM Neurological: alert- poor awareness of deficits  Motor: RUE/RLE: 5/5 proximal distal LLE: 5/5 proximal distal LUE: Shoulder abduction 2+/5, elbow flexion/extension 2/5, distally 2-/5   Assessment/Plan: 1. Functional deficits which require 3+ hours per day of interdisciplinary therapy in a comprehensive inpatient rehab setting.  Physiatrist is providing close team supervision and 24 hour management of active  medical problems listed below.  Physiatrist and rehab team continue to assess barriers to discharge/monitor patient progress toward functional and medical goals   Care Tool:  Bathing    Body parts bathed by patient: Chest,Abdomen,Left arm,Front perineal area,Right upper leg,Left upper leg,Face,Right arm,Buttocks,Right lower leg,Left lower leg   Body parts bathed by helper: Buttocks,Right lower leg,Left lower leg,Right arm     Bathing assist Assist Level: Contact Guard/Touching assist     Upper Body Dressing/Undressing Upper body dressing   What is the patient wearing?: Pull over shirt    Upper body assist Assist Level: Contact Guard/Touching assist    Lower Body Dressing/Undressing Lower body dressing      What is the patient wearing?: Underwear/pull up,Pants     Lower body assist Assist for lower body dressing: Contact Guard/Touching assist     Toileting Toileting    Toileting assist Assist for toileting: Supervision/Verbal cueing     Transfers Chair/bed transfer  Transfers assist     Chair/bed transfer assist level: Contact Guard/Touching assist     Locomotion Ambulation   Ambulation assist      Assist level: Minimal Assistance - Patient > 75% Assistive device: Walker-rolling Max distance: 178ft   Walk 10 feet activity   Assist     Assist level: Minimal Assistance - Patient > 75% Assistive device: Walker-rolling   Walk 50 feet activity   Assist Walk 50 feet with 2 turns activity did not occur: Safety/medical concerns  Assist level: Minimal Assistance - Patient > 75% Assistive device: Walker-rolling    Walk 150 feet activity   Assist Walk 150 feet activity did not occur: Safety/medical concerns  Assist level: Minimal Assistance - Patient > 75% Assistive device: Walker-rolling    Walk 10 feet on uneven surface  activity   Assist Walk 10 feet on uneven surfaces activity did not occur: Safety/medical concerns          Wheelchair     Assist Will patient use wheelchair at discharge?: No   Wheelchair activity did not occur: N/A         Wheelchair 50 feet with 2 turns activity    Assist    Wheelchair 50 feet with 2 turns activity did not occur: N/A       Wheelchair 150 feet activity     Assist  Wheelchair 150 feet activity did not occur: N/A        Medical Problem List and Plan: 1.  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  Went back in the room later and discussed with wife, who states that patient will not be leaving today.  Patient and agreement to stay.  con't PT , OT and SLP  -will get MRI of brain per Neurology due to L facial numbness that's worse/new- is resolving, but just to make sure hasn't extended his stroke.  2.  Antithrombotics: -DVT/anticoagulation: Lovenox             -antiplatelet therapy: Aspirin 81 mg daily and Plavix 75 mg daily for 3 weeks total then aspirin alone  5/8- will allow Neuro to determine if needs to change- is on ASA and Plavic, DOAC-  3. Pain Management: Tylenol as needed 4. Mood: Provide emotional support             -antipsychotic agents: N/A 5. Neuropsych: This patient is? capable of making decisions on his own behalf. 6. Skin/Wound Care: Routine skin checks 7. Fluids/Electrolytes/Nutrition: Routine in and outs.               CMP ordered for tomorrow  5/8- will check BMP tomorrow AM 8.  Hypertension.  Lisinopril 40 mg daily, Toprol-XL 75 mg daily.    Relatively controlled on 5/6  5/7- slightly elevated today- 143/62- con't regimen  5/8- BP 140/61- overall controlled- con't regimen             Monitor with increased mobility 9.  Hyperlipidemia: Lipitor 10. Uncontrolled diabetes mellitus with hyperglycemia.   Hemoglobin A1c 8.3.  SSI.  Presently on Lantus insulin 12 units nightly.  Patient also on Glucotrol 10 mg twice daily as well as farxiga 10 mg daily prior to admission.  Resume as needed  Remains elevated on 5/6, will not make further adjustments today, given recent medication additions.  5/7- pt said likes insulin "better"- doesn't like Metformin-  5/8- BGs 130-192 but only 1 BG >160- will monitor and continue regimen             Monitor with increased mobility 11.  History of tobacco use.  Provide counseling 12.  Thrombocytopenia              Platelets 89 on 5/5, labs ordered for Monday  Continue to monitor 13.  Hypoalbuminemia  Supplement initiated on 5/5 14.  Poor safety awareness  5/7- will d/w nursing if needs telesitter?  5/8- did better today asking to go to bathroom- con't to monitor  LOS: 4 days A FACE TO FACE EVALUATION WAS PERFORMED  Nellie Pester 06/25/2020, 10:07 AM

## 2020-06-26 DIAGNOSIS — Z91199 Patient's noncompliance with other medical treatment and regimen due to unspecified reason: Secondary | ICD-10-CM

## 2020-06-26 DIAGNOSIS — Z9119 Patient's noncompliance with other medical treatment and regimen: Secondary | ICD-10-CM

## 2020-06-26 LAB — CBC WITH DIFFERENTIAL/PLATELET
Abs Immature Granulocytes: 0.01 10*3/uL (ref 0.00–0.07)
Basophils Absolute: 0 10*3/uL (ref 0.0–0.1)
Basophils Relative: 0 %
Eosinophils Absolute: 0.2 10*3/uL (ref 0.0–0.5)
Eosinophils Relative: 2 %
HCT: 54.7 % — ABNORMAL HIGH (ref 39.0–52.0)
Hemoglobin: 17.9 g/dL — ABNORMAL HIGH (ref 13.0–17.0)
Immature Granulocytes: 0 %
Lymphocytes Relative: 27 %
Lymphs Abs: 1.9 10*3/uL (ref 0.7–4.0)
MCH: 29.5 pg (ref 26.0–34.0)
MCHC: 32.7 g/dL (ref 30.0–36.0)
MCV: 90.3 fL (ref 80.0–100.0)
Monocytes Absolute: 0.7 10*3/uL (ref 0.1–1.0)
Monocytes Relative: 9 %
Neutro Abs: 4.4 10*3/uL (ref 1.7–7.7)
Neutrophils Relative %: 62 %
Platelets: 118 10*3/uL — ABNORMAL LOW (ref 150–400)
RBC: 6.06 MIL/uL — ABNORMAL HIGH (ref 4.22–5.81)
RDW: 13.1 % (ref 11.5–15.5)
WBC: 7.2 10*3/uL (ref 4.0–10.5)
nRBC: 0 % (ref 0.0–0.2)

## 2020-06-26 LAB — BASIC METABOLIC PANEL
Anion gap: 8 (ref 5–15)
BUN: 22 mg/dL — ABNORMAL HIGH (ref 6–20)
CO2: 28 mmol/L (ref 22–32)
Calcium: 9.2 mg/dL (ref 8.9–10.3)
Chloride: 100 mmol/L (ref 98–111)
Creatinine, Ser: 1.08 mg/dL (ref 0.61–1.24)
GFR, Estimated: >60 mL/min (ref >60)
Glucose, Bld: 150 mg/dL — ABNORMAL HIGH (ref 70–99)
Potassium: 4.4 mmol/L (ref 3.5–5.1)
Sodium: 136 mmol/L (ref 135–145)

## 2020-06-26 LAB — GLUCOSE, CAPILLARY
Glucose-Capillary: 170 mg/dL — ABNORMAL HIGH (ref 70–99)
Glucose-Capillary: 168 mg/dL — ABNORMAL HIGH (ref 70–99)
Glucose-Capillary: 152 mg/dL — ABNORMAL HIGH (ref 70–99)
Glucose-Capillary: 145 mg/dL — ABNORMAL HIGH (ref 70–99)

## 2020-06-26 MED ORDER — LORAZEPAM 1 MG PO TABS
1.0000 mg | ORAL_TABLET | Freq: Once | ORAL | Status: AC
  Administered 2020-06-26: 1 mg via ORAL
  Filled 2020-06-26: qty 1

## 2020-06-26 MED ORDER — LORAZEPAM 2 MG/ML IJ SOLN: 1.0000 mg | Freq: Once | INTRAMUSCULAR | Status: AC

## 2020-06-26 MED ORDER — GLIPIZIDE 5 MG PO TABS
5.0000 mg | ORAL_TABLET | Freq: Every day | ORAL | Status: DC
  Administered 2020-06-27 – 2020-07-03 (×7): 5 mg via ORAL
  Filled 2020-06-26 (×7): qty 1

## 2020-06-26 NOTE — Progress Notes (Signed)
[place] PHYSICAL MEDICINE & REHABILITATION PROGRESS NOTE  Subjective/Complaints: Patient seen sitting up in his chair this AM.  Therapy at bedside.  Patient states he slept well overnight and had a good weekend, but states he will be leaving.  He state he spoke with his wife and she is coming to pick him up (mentionted similar on Friday).  He also states he refused to wear safety belts.  Discussed function with therapies, who note persistent balance and gait deficits.   ROS: Denies CP, SOB, N/V/D  Objective: Vital Signs: Blood pressure (!) 148/66, pulse 61, temperature 97.8 F (36.6 C), resp. rate 16, height 5' 7.5" (1.715 m), weight 93.4 kg, SpO2 95 %. MR BRAIN WO CONTRAST  Result Date: 06/25/2020 CLINICAL DATA:  Recent right-sided white matter infarction with left facial numbness. EXAM: MRI HEAD WITHOUT CONTRAST TECHNIQUE: Multiplanar, multiecho pulse sequences of the brain and surrounding structures were obtained without intravenous contrast. COMPARISON:  CT and MRI 06/17/2020. FINDINGS: Brain: Diffusion imaging shows a new punctate acute infarction in the white matter of the left external capsule. Second new acute punctate infarction in the left frontal subcortical white matter. Previously seen acute infarction within the right corona radiata is again demonstrated without evidence of extension. No large vessel territory acute infarction. Extensive chronic small-vessel ischemic changes elsewhere throughout the pons, thalami, basal ganglia and hemispheric white matter are stable. Generalized brain atrophy as seen previously. No hydrocephalus, acute hemorrhage or extra-axial collection. Vascular: Major vessels at the base of the brain show flow. Skull and upper cervical spine: Negative Sinuses/Orbits: Clear/normal Other: Bilateral mastoid effusions as seen previously. IMPRESSION: Two new punctate white matter infarctions in the left hemisphere, 1 within the external capsule in the other within the  left frontal subcortical white matter towards the vertex. No mass effect or acute hemorrhage. No evidence of extension of the previously seen acute infarction in the right corona radiata. Extensive widespread old small vessel ischemic changes throughout the brain as seen previously. Redemonstration of mastoid effusions. Electronically Signed   By: Paulina Fusi M.D.   On: 06/25/2020 15:12   Recent Labs    06/26/20 1025  WBC 7.2  HGB 17.9*  HCT 54.7*  PLT 118*   Recent Labs    06/26/20 0536  NA 136  K 4.4  CL 100  CO2 28  GLUCOSE 150*  BUN 22*  CREATININE 1.08  CALCIUM 9.2    Intake/Output Summary (Last 24 hours) at 06/26/2020 1301 Last data filed at 06/26/2020 0849 Gross per 24 hour  Intake 840 ml  Output 1050 ml  Net -210 ml        Physical Exam: BP (!) 148/66 (BP Location: Right Arm)   Pulse 61   Temp 97.8 F (36.6 C)   Resp 16   Ht 5' 7.5" (1.715 m)   Wt 93.4 kg   SpO2 95%   BMI 31.76 kg/m  Constitutional: No distress . Vital signs reviewed. HENT: Normocephalic.  Atraumatic. Eyes: EOMI. No discharge. Cardiovascular: No JVD.  RRR. Respiratory: Normal effort.  No stridor.  Bilateral clear to auscultation. GI: Non-distended.  BS +. Skin: Warm and dry.  Intact. Psych: Normal mood.  Normal behavior. Musc: No edema in extremities.  No tenderness in extremities. Neuro: Alert Motor: RUE/RLE: 5/5 proximal distal LLE: 5/5 proximal distal, stable LUE: Shoulder abduction 2/5, elbow flexion/extension 2/5, distally 2-/5   Assessment/Plan: 1. Functional deficits which require 3+ hours per day of interdisciplinary therapy in a comprehensive inpatient rehab setting.  Physiatrist  is providing close team supervision and 24 hour management of active medical problems listed below.  Physiatrist and rehab team continue to assess barriers to discharge/monitor patient progress toward functional and medical goals   Care Tool:  Bathing    Body parts bathed by patient:  Chest,Abdomen,Left arm,Front perineal area,Right upper leg,Left upper leg,Face,Right arm,Buttocks,Right lower leg,Left lower leg   Body parts bathed by helper: Buttocks,Right lower leg,Left lower leg,Right arm     Bathing assist Assist Level: Contact Guard/Touching assist     Upper Body Dressing/Undressing Upper body dressing   What is the patient wearing?: Pull over shirt    Upper body assist Assist Level: Contact Guard/Touching assist    Lower Body Dressing/Undressing Lower body dressing      What is the patient wearing?: Underwear/pull up,Pants     Lower body assist Assist for lower body dressing: Contact Guard/Touching assist     Toileting Toileting    Toileting assist Assist for toileting: Supervision/Verbal cueing     Transfers Chair/bed transfer  Transfers assist     Chair/bed transfer assist level: Contact Guard/Touching assist     Locomotion Ambulation   Ambulation assist      Assist level: Minimal Assistance - Patient > 75% Assistive device: Walker-rolling Max distance: 164ft   Walk 10 feet activity   Assist     Assist level: Minimal Assistance - Patient > 75% Assistive device: Walker-rolling   Walk 50 feet activity   Assist Walk 50 feet with 2 turns activity did not occur: Safety/medical concerns  Assist level: Minimal Assistance - Patient > 75% Assistive device: Walker-rolling    Walk 150 feet activity   Assist Walk 150 feet activity did not occur: Safety/medical concerns  Assist level: Minimal Assistance - Patient > 75% Assistive device: Walker-rolling    Walk 10 feet on uneven surface  activity   Assist Walk 10 feet on uneven surfaces activity did not occur: Safety/medical concerns         Wheelchair     Assist Will patient use wheelchair at discharge?: No   Wheelchair activity did not occur: N/A         Wheelchair 50 feet with 2 turns activity    Assist    Wheelchair 50 feet with 2 turns  activity did not occur: N/A       Wheelchair 150 feet activity     Assist  Wheelchair 150 feet activity did not occur: N/A        Medical Problem List and Plan: 1.  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  Antithrombotics: -DVT/anticoagulation: Lovenox             -antiplatelet therapy: Aspirin 81 mg daily and Plavix 75 mg daily for 3 weeks total then aspirin alone 3. Pain Management: Tylenol as needed 4. Mood: Provide emotional support             -antipsychotic agents: N/A 5. Neuropsych: This patient is? capable of making decisions on his own behalf. 6. Skin/Wound Care: Routine skin checks 7. Fluids/Electrolytes/Nutrition: Routine in and outs.  CMP ordered for tomorrow  5/8- will check BMP tomorrow AM 8.  Hypertension.  Lisinopril 40 mg daily, Toprol-XL 75 mg daily.    Relatively controlled on 5/6  Mildly elevated on 5/9             Monitor with increased mobility 9.  Hyperlipidemia: Lipitor 10. Uncontrolled diabetes mellitus with hyperglycemia.  Hemoglobin A1c 8.3.  SSI.  Presently on Lantus insulin 12 units nightly.  Patient also on farxiga 10 mg daily prior to admission.    Glucotrol  5 daily (10 mg twice daily PTA) started on 5/9            Monitor with increased mobility 11.  History of tobacco use.  Provide counseling 12.  Thrombocytopenia              Platelets 118 on 5/9  Continue to monitor 13.  Hypoalbuminemia  Supplement initiated on 5/5 14. Poor safety awareness with noncompliance  LOS: 5 days A FACE TO  FACE EVALUATION WAS PERFORMED  Jazmon Kos [name] 06/26/2020, 1:01 PM

## 2020-06-26 NOTE — Progress Notes (Signed)
Patient ID: Frank Luna, male   DOB: October 25, 1960, 60 y.o.   MRN: [id number]  Pt insisting he is going home today. Wife and another family member is here visiting. Wife does not feel she can manage pt at this time, but also not telling him she can't manage him. He feels he can go home and walk with a walker and/or wheelchair. Have ordered wheelchair to deliver to home, made wife aware she will need to get a rolling walker since insurance will only pay for one. Pt has agreed to stay for another two days and no more. Will try to find home health agency to take his medicaid insurance. Most will not take it. Continue to work on discharge needs. Encouraged wife to stay and attend therapies with pt today.

## 2020-06-26 NOTE — Progress Notes (Signed)
Patient ID: Unnamed Zeien, male   DOB: 1960-05-29, 60 y.o.   MRN: [id number] Follow up with patient and wife and mother regarding lack of participation, and repeated expression of wanting to go home. Discussed current functional status with all and need for additional therapy to address left side weakness. Patient noted he feels trapped and not able to do what he feels he can do, cannot sleep (has not slept well in days) irritable and demanding charge bring him the AMA form to sign and wife will take him home. Wife notes she does not feel comfortable with AMA or discharge today  and she with her own health issues; she does not feel she can provide the patient's level of care needed. The patient noted he can "walk by himself and when he gets home he will go down to the basement to work, do the things he usually did before the stroke; he feels he does not need help, equipment or additional therapy. If he gets more therapy to work on his left side, he would rather it be home health and not have to stay in the hospital. [facility] Dan Angiulli notified and spoke at length with the patient, wife and mother about the current situation. Jesusita Oka was able to help the patient settle down and agreed to stay for a couple of days so that the staff could coordinate follow up and arrange for DME, etc for discharge rather than sign out AMA. Patient is agreeable and wife/mother appreciative of the time spent with the group. Wife and mother took laundry home and patient left sitting in the recliner eating a biscuit wife had brought him. Settled and cooperative at the present time. Pamelia Hoit

## 2020-06-26 NOTE — Progress Notes (Signed)
Occupational Therapy Session Note  Patient Details  Name: Frank Luna MRN: [id number] Date of Birth: 11/12/1960  Today's Date: 06/26/2020 OT Individual Time: 5643-3295 OT Individual Time Calculation (min): 10 min  Missed time: 65 mins Reason: Pt refusing therapy, says he is leaving today even if he has to crawl out of here.  Short Term Goals: Week 1:  OT Short Term Goal 1 (Week 1): Pt will don a tshirt with min A. OT Short Term Goal 2 (Week 1): Pt will don pants over feet min A. OT Short Term Goal 3 (Week 1): Pt will pull pants over hips with CGA for balance. OT Short Term Goal 4 (Week 1): Pt will use a long handled sponge as needed for washing feet and R arm. OT Short Term Goal 5 (Week 1): Pt will be able to use L hand as a stabilizing A with dressing tasks.  Skilled Therapeutic Interventions/Progress Updates:    Pt received standing up from recliner with chair alarm going off and family present. Pt used RW to walk to bathroom with close SUP and cuing for safety. Pt is very impulsive and unwilling to participate in therapy other than immediate need for toileting task. Consulted with family, they want him to stay but he is adamant about leaving today AMA. Educated pt on benefits of staying and completing therapy, but he said he wants to go home. Pt becoming increasingly agitated, helped back to recliner with chair alarm on with RN and family present.  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 Mild L inattention Restrictions Weight Bearing Restrictions: No General: General PT Missed Treatment Reason: Patient unwilling to participate Pain: Pain Assessment Pain Scale: 0-10 Pain Score: 0-No pain   Therapy/Group: Individual Therapy  Quana Chamberlain 06/26/2020, 12:03 PM

## 2020-06-26 NOTE — Progress Notes (Signed)
Occupational Therapy Session Note  Patient Details  Name: Frank Luna MRN: [id number] Date of Birth: [birth year]  Today's Date: 06/26/2020 OT Individual Time: 2585-2778 OT Individual Time Calculation (min): 62 min    Short Term Goals: Week 1:  OT Short Term Goal 1 (Week 1): Pt will don a tshirt with min A. OT Short Term Goal 2 (Week 1): Pt will don pants over feet min A. OT Short Term Goal 3 (Week 1): Pt will pull pants over hips with CGA for balance. OT Short Term Goal 4 (Week 1): Pt will use a long handled sponge as needed for washing feet and R arm. OT Short Term Goal 5 (Week 1): Pt will be able to use L hand as a stabilizing A with dressing tasks.  Skilled Therapeutic Interventions/Progress Updates:    Pt received in room in recliner and consented to OT tx. Pt agreeable to session this afternoon, much less agitated during afternoon session. Pt seen for LUE NMR, ROM exercises, and standing tolerance and balance activities this session. Pt instructed to stand with RW and walk to w/c, however, probably due to pt's fatigue, he was very unsafe with the RW and instructed to sit down on bed. From there, pt completed squat pivot transfer to w/c with CGA. Pt taken down to therapy gym, attended e-stim applied for 15 mins to L wrist flexors at 40 maCC, pt tolerated tx well. Afterwards, pt instructed in standing tolerance activity which included matching cards with functional reach while standing at the RW with min-CGA for balance. Pt required increased time for task, extensive seated rest breaks due to fatigue. Pt instructed in seated self ROM exercises for LUE, instructed in shoulder flexion, elbow flexion and extension, and wrist flexion and extension for 3x10 with min cuing for proper technique with good carryover. After tx, pt helped back to recliner and left with all needs met and chair alarm on.  Therapy Documentation Precautions:  Precautions Precautions: Fall Precaution Comments: L hemi  (LUE>LLE). Mild L inattention Restrictions Weight Bearing Restrictions: No   Vital Signs: Therapy Vitals Temp: 98.2 F (36.8 C) Pulse Rate: 81 Resp: 19 BP: 132/78 Patient Position (if appropriate): Sitting Oxygen Therapy SpO2: 97 % O2 Device: Room Air Pain: none     Therapy/Group: Individual Therapy  Tova Vater 06/26/2020, 3:43 PM

## 2020-06-26 NOTE — Progress Notes (Signed)
Patient wanted to sit in the lounge chair early this morning. Chair alarm on but refused and agitated over a belt alarm. Reminded patient to use call bell for any asssitance. Call bell within reach.

## 2020-06-26 NOTE — Progress Notes (Signed)
Physical Therapy Session Note  Patient Details  Name: Frank Luna MRN: [id number] Date of Birth: Nov 19, 1960  Today's Date: 06/26/2020 PT Individual Time: 0800-0815 PT Individual Time Calculation (min): 15 min   Short Term Goals: Week 1:  PT Short Term Goal 1 (Week 1): Pt will complete bed mobility with CGA and no hospital bed features PT Short Term Goal 2 (Week 1): Pt will complete bed<>chair transfers with CGA and LRAD PT Short Term Goal 3 (Week 1): Pt will ambulated 168ft with minA and LRAD PT Short Term Goal 4 (Week 1): Pt will initiate stair training  Skilled Therapeutic Interventions/Progress Updates:    Pt received sitting in recliner at start of PT tx. He reports no pain but L arm tingling. Pt reports his wife is coming to pick him up this morning and he is going home. Educated patient on barriers to discharge, high falls risk, concern for returning home. Pt continued to refuse and says he will do better at home where he can exercise his arm. He says he will crawl home if he has to. Also educated him that his deficits are more than just L arm weakness, but balance, awareness, proprioception, etc. Pt continued to argue that he's going home no matter what. Unable to convince pt to participate or continue CIR therapies. MD entering for morning rounds and a similar discussion was held with him and the patient. Pt missed 45 minutes of skilled therapy due to refusal.   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 Mild L inattention Restrictions Weight Bearing Restrictions: No General: PT Amount of Missed Time (min): 45 Minutes PT Missed Treatment Reason: Patient unwilling to participate  Therapy/Group: Individual Therapy  Yalissa Fink P Kushi Kun PT 06/26/2020, 7:39 AM

## 2020-06-27 LAB — GLUCOSE, CAPILLARY
Glucose-Capillary: 161 mg/dL — ABNORMAL HIGH (ref 70–99)
Glucose-Capillary: 128 mg/dL — ABNORMAL HIGH (ref 70–99)
Glucose-Capillary: 131 mg/dL — ABNORMAL HIGH (ref 70–99)
Glucose-Capillary: 155 mg/dL — ABNORMAL HIGH (ref 70–99)

## 2020-06-27 MED ORDER — TRAZODONE HCL 50 MG PO TABS
50.0000 mg | ORAL_TABLET | Freq: Every evening | ORAL | Status: DC | PRN
  Administered 2020-07-02 – 2020-07-03 (×2): 50 mg via ORAL
  Filled 2020-06-27 (×3): qty 1

## 2020-06-27 MED ORDER — QUETIAPINE FUMARATE 25 MG PO TABS
25.0000 mg | ORAL_TABLET | Freq: Every day | ORAL | Status: DC
  Administered 2020-06-27: 25 mg via ORAL
  Filled 2020-06-27: qty 1

## 2020-06-27 NOTE — Progress Notes (Signed)
Physical Therapy Session Note  Patient Details  Name: Frank Luna MRN: [id number] Date of Birth: March 20, 1960  Today's Date: 06/27/2020 PT Individual Time: 5462-7035 + 1400-1456  PT Individual Time Calculation (min): 58 min  + 56 min  Short Term Goals: Week 1:  PT Short Term Goal 1 (Week 1): Pt will complete bed mobility with CGA and no hospital bed features PT Short Term Goal 2 (Week 1): Pt will complete bed<>chair transfers with CGA and LRAD PT Short Term Goal 3 (Week 1): Pt will ambulated 152ft with minA and LRAD PT Short Term Goal 4 (Week 1): Pt will initiate stair training  Skilled Therapeutic Interventions/Progress Updates:     1st session: Pt received sitting in recliner with chair alarm on, pt agreeable to PT tx. No reports of pain and no agitation noted this morning - did not bring up DC planning to avoid agitation.   Pt donned slip-on shoes with setupA while seated in chair. Sit<>stand with minA to RW and and ambulated with minA and RW in his room to the w/c- cues needed for safety approach and stepping back to w/c prior to sitting.   Worked on self propulsion in w/c using BLE's only, focusing on reciprocal movement patterns and coordination of LLE and RLE - pt with increased difficulty producing rhythmic L & R stepping while propelling in w/c, motor apraxia & L inattention impacting. Stand<>pivot transfer with minA and no AD from w/c to mat table with cues needed for awareness of L foot placement.   Gait training ~179ft + ~130ft with CGA and intermittent minA with RW on level ground - minA needed due to overcorrection LOB while stepping with LLE. Step-to gait pattern and pt very concentrated on L foot placement.   Completed unilateral standing marches for NMR, 2x15, with minA and RW support - working on stance control for LLE during RLE marches - pt fearful of falling during this activity and benefited from encouragement and cues for sequencing. Pt progressed to alternating  standing marches, 1x10, with minA and RW support- increased difficulty with this and needed cues for awareness of L foot placement due to narrow BOS - forward flexed compensation noted as well.  Stand<>pivot with CGA and RW to w/c and wheeled back to room in w/c. Worked on BLE isometric extension during transport back to room needed mod cues for keeping LLE even with RLE.  Ambulated with minA and no AD short distances within his room back to his recliner. Chair alarm on, needs within reach.   2nd session: Pt greeted seated in recliner at start of session, no reports of pain. Agreeable to PT tx. Sit<>stand with minA to RW and ambulated short distances within room with minA and RW - cues for increased L step length and L foot clearance. W/c transport to dayroom gym for time management where focus of session was to perform LiteGait over Treadmill for NMR gait training. Applied harness while seated in w/c. Explained to pt the purpose of LiteGait and high intensity gait training. Pt able to step up to treadmill with minA. Performed the following gait trials:  Trial 1: 5 minutes, 0.109mph, 134ft -Step-to gait pattern. Poor L foot clearance and late stepping with RLE. Tactile feedback and verbal cues for L stepping pattern and normalizing gait pattern. Pt "draggin" L foot and is aware but difficulty correcting. Gait deficits begin to degrade after ~3.5 minute mark.  Trial 2: 5 minutes, 0.59mph, 159ft -Improved cadence and L foot clearance with cues. Provided  visual targets for stepping over obstacle on treadmill. Last minute gait deteriorated with increased L knee instability.   Trial 3: 5 minutes, 0.15mph, 172ft -Decreased external feedback for gait corrections to allow problem solving and internal feedback to initiate. The first 3 minutes, pt demonstrates excellent gait quality with appropriate cadence and step-through gait pattern. After 3 minute mark, gait began to regress to step-to pattern and then  regressing to L foot dragging. Educated patient on limits and energy conservation outside of [employer].  *extra time needed for setting up/removing and cleaning/sterilizing equipment.  Pt able to step off treadmill with minA and removed harness while he sat in w/c. Transported for time management back to his room and completed stand<>pivot with minA to recliner. Pt remained seated in recliner with needs in reach and chair alarm on - reminded pt to call for staff assist for needs.   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 Mild L inattention Restrictions Weight Bearing Restrictions: No General:    Therapy/group: Individual Therapy  Jayli Fogleman P Aloise Copus PT 06/27/2020, 7:33 AM

## 2020-06-27 NOTE — Progress Notes (Addendum)
[place] PHYSICAL MEDICINE & REHABILITATION PROGRESS NOTE  Subjective/Complaints: Patient seen sitting up, working with therapies this AM.  He states he slept well overnight.  Per therapies, pt participating and appreciative of opportunity to stay.  However, patient yesterday, demanding to leave again.   ROS: Denies CP, SOB, N/V/D  Objective: Vital Signs: Blood pressure (!) 151/84, pulse 62, temperature 98.6 F (37 C), resp. rate 18, height 5' 7.5" (1.715 m), weight 93.4 kg, SpO2 97 %. MR BRAIN WO CONTRAST  Result Date: 06/25/2020 CLINICAL DATA:  Recent right-sided white matter infarction with left facial numbness. EXAM: MRI HEAD WITHOUT CONTRAST TECHNIQUE: Multiplanar, multiecho pulse sequences of the brain and surrounding structures were obtained without intravenous contrast. COMPARISON:  CT and MRI 06/17/2020. FINDINGS: Brain: Diffusion imaging shows a new punctate acute infarction in the white matter of the left external capsule. Second new acute punctate infarction in the left frontal subcortical white matter. Previously seen acute infarction within the right corona radiata is again demonstrated without evidence of extension. No large vessel territory acute infarction. Extensive chronic small-vessel ischemic changes elsewhere throughout the pons, thalami, basal ganglia and hemispheric white matter are stable. Generalized brain atrophy as seen previously. No hydrocephalus, acute hemorrhage or extra-axial collection. Vascular: Major vessels at the base of the brain show flow. Skull and upper cervical spine: Negative Sinuses/Orbits: Clear/normal Other: Bilateral mastoid effusions as seen previously. IMPRESSION: Two new punctate white matter infarctions in the left hemisphere, 1 within the external capsule in the other within the left frontal subcortical white matter towards the vertex. No mass effect or acute hemorrhage. No evidence of extension of the previously seen acute infarction in the right  corona radiata. Extensive widespread old small vessel ischemic changes throughout the brain as seen previously. Redemonstration of mastoid effusions. Electronically Signed   By: Paulina Fusi M.D.   On: 06/25/2020 15:12   Recent Labs    06/26/20 1025  WBC 7.2  HGB 17.9*  HCT 54.7*  PLT 118*   Recent Labs    06/26/20 0536  NA 136  K 4.4  CL 100  CO2 28  GLUCOSE 150*  BUN 22*  CREATININE 1.08  CALCIUM 9.2    Intake/Output Summary (Last 24 hours) at 06/27/2020 1211 Last data filed at 06/27/2020 0726 Gross per 24 hour  Intake 754 ml  Output 450 ml  Net 304 ml        Physical Exam: BP (!) 151/84 (BP Location: Right Arm)   Pulse 62   Temp 98.6 F (37 C)   Resp 18   Ht 5' 7.5" (1.715 m)   Wt 93.4 kg   SpO2 97%   BMI 31.76 kg/m  Constitutional: No distress . Vital signs reviewed. HENT: Normocephalic.  Atraumatic. Eyes: EOMI. No discharge. Cardiovascular: No JVD.  RRR. Respiratory: Normal effort.  No stridor.  Bilateral clear to auscultation. GI: Non-distended.  BS +. Skin: Warm and dry.  Intact. Psych: Normal mood.  Normal behavior. Musc: No edema in extremities.  No tenderness in extremities. Neuro: Alert Motor: RUE/RLE: 5/5 proximal distal LLE: 5/5 proximal distal, stable LUE: Shoulder abduction 2/5, elbow flexion/extension 2/5, distally 2-/5 with apraxia   Assessment/Plan: 1. Functional deficits which require 3+ hours per day of interdisciplinary therapy in a comprehensive inpatient rehab setting.  Physiatrist is providing close team supervision and 24 hour management of active medical problems listed below.  Physiatrist and rehab team continue to assess barriers to discharge/monitor patient progress toward functional and medical goals   Care  Tool:  Bathing    Body parts bathed by patient: Chest,Abdomen,Left arm,Front perineal area,Right upper leg,Left upper leg,Face,Right arm,Buttocks,Right lower leg,Left lower leg   Body parts bathed by helper:  Buttocks,Right lower leg,Left lower leg,Right arm     Bathing assist Assist Level: Contact Guard/Touching assist     Upper Body Dressing/Undressing Upper body dressing   What is the patient wearing?: Pull over shirt    Upper body assist Assist Level: Contact Guard/Touching assist    Lower Body Dressing/Undressing Lower body dressing      What is the patient wearing?: Underwear/pull up,Pants     Lower body assist Assist for lower body dressing: Contact Guard/Touching assist     Toileting Toileting    Toileting assist Assist for toileting: Supervision/Verbal cueing     Transfers Chair/bed transfer  Transfers assist     Chair/bed transfer assist level: Contact Guard/Touching assist     Locomotion Ambulation   Ambulation assist      Assist level: Minimal Assistance - Patient > 75% Assistive device: Walker-rolling Max distance: 172ft   Walk 10 feet activity   Assist     Assist level: Minimal Assistance - Patient > 75% Assistive device: Walker-rolling   Walk 50 feet activity   Assist Walk 50 feet with 2 turns activity did not occur: Safety/medical concerns  Assist level: Minimal Assistance - Patient > 75% Assistive device: Walker-rolling    Walk 150 feet activity   Assist Walk 150 feet activity did not occur: Safety/medical concerns  Assist level: Minimal Assistance - Patient > 75% Assistive device: Walker-rolling    Walk 10 feet on uneven surface  activity   Assist Walk 10 feet on uneven surfaces activity did not occur: Safety/medical concerns         Wheelchair     Assist Will patient use wheelchair at discharge?: No   Wheelchair activity did not occur: N/A         Wheelchair 50 feet with 2 turns activity    Assist    Wheelchair 50 feet with 2 turns activity did not occur: N/A       Wheelchair 150 feet activity     Assist  Wheelchair 150 feet activity did not occur: N/A        Medical Problem List  and Plan: 1.  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  Antithrombotics: -DVT/anticoagulation: Lovenox             -antiplatelet therapy: Aspirin 81 mg daily and Plavix 75 mg daily for 3 weeks total then aspirin alone 3. Pain Management: Tylenol as needed 4. Mood: Provide emotional support             -antipsychotic agents: Seroquel started on 5/10 5. Neuropsych: This patient is? capable of making decisions on his own behalf. 6. Skin/Wound Care: Routine skin checks 7. Fluids/Electrolytes/Nutrition: Routine in and outs.               CMP ordered for tomorrow  5/8- will check BMP tomorrow AM 8.  Hypertension.  Lisinopril 40 mg daily, Toprol-XL 75 mg daily.    Relatively controlled on 5/6  Slightly labile, will make med adjustments if persistently  elevated             Monitor with increased mobility 9.  Hyperlipidemia: Lipitor 10. Uncontrolled diabetes mellitus with hyperglycemia.  Hemoglobin A1c 8.3.  SSI.  Presently on Lantus insulin 12 units nightly.  Patient also on farxiga 10 mg daily prior to admission.    Glucotrol  5 daily (10 mg twice daily PTA) started on 5/10            Monitor with increased mobility 11.  History of tobacco use.  Provide counseling 12.  Thrombocytopenia              Platelets 118 on 5/9  Continue to monitor 13.  Hypoalbuminemia  Supplement initiated on 5/5 14. Poor safety awareness with noncompliance  LOS: 6 days A FACE TO FACE EVALUATION WAS PERFORMED  Frank Luna [name] 06/27/2020, 12:11 PM

## 2020-06-27 NOTE — Progress Notes (Signed)
Occupational Therapy Session Note  Patient Details  Name: Frank Luna MRN: [id number] Date of Birth: [birth year]  Today's Date: 06/27/2020 OT Individual Time: 4239-5320 OT Individual Time Calculation (min): 64 min    Short Term Goals: Week 1:  OT Short Term Goal 1 (Week 1): Pt will don a tshirt with min A. OT Short Term Goal 2 (Week 1): Pt will don pants over feet min A. OT Short Term Goal 3 (Week 1): Pt will pull pants over hips with CGA for balance. OT Short Term Goal 4 (Week 1): Pt will use a long handled sponge as needed for washing feet and R arm. OT Short Term Goal 5 (Week 1): Pt will be able to use L hand as a stabilizing A with dressing tasks.  Skilled Therapeutic Interventions/Progress Updates:    Pt received in room with family present and consented to OT tx. Pt seen for instruction and training in morning ADL routine including bathing, toileting, dressing, grooming, and functional transfers. Pt req min A for all SPTs with no device. Cuing for safety throughout as pt continues to demo decreased safety awareness during functional tasks. Pt req min A to wash RUE while in shower, CGA-min A for balance while standing to wash buttocks. Instructed in hemi dressing techniques for UB clothing, pt req mod A for LB clothing and to hike all the way in standing. Increased time needed to do more for himself. Pt req CGA-close SUP for clothing mgmt during toileting tasks. After ADL routine, therapist applied e-stim to L wrist extensors for increased strength and tone improvement. Settings at 60mCC for 15 mins with 10 seconds on 10 seconds off. Pt tolerated tx well, no adverse effects. After tx, pt helped back to recliner and left with chair alarm on and all needs met.   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 Mild L inattention Restrictions Weight Bearing Restrictions: No   Pain: Pain Assessment Pain Scale: 0-10 Pain Score:  0-No pain   Therapy/Group: Individual Therapy  Yonathan Perrow 06/27/2020, 10:43 AM

## 2020-06-27 NOTE — Progress Notes (Signed)
Occupational Therapy Session Note  Patient Details  Name: Adonias Demore MRN: [id number] Date of Birth: [birth year]  Today's Date: 06/27/2020 OT Individual Time: 8688-5207 OT Individual Time Calculation (min): 30 min    Short Term Goals: Week 1:  OT Short Term Goal 1 (Week 1): Pt will don a tshirt with min A. OT Short Term Goal 2 (Week 1): Pt will don pants over feet min A. OT Short Term Goal 3 (Week 1): Pt will pull pants over hips with CGA for balance. OT Short Term Goal 4 (Week 1): Pt will use a long handled sponge as needed for washing feet and R arm. OT Short Term Goal 5 (Week 1): Pt will be able to use L hand as a stabilizing A with dressing tasks.  Skilled Therapeutic Interventions/Progress Updates:    Pt received in recliner and consented to OT tx. Pt seen for toileting tasks and LUE NMR this session. Pt completed all aspects of toleting with CGA and cuin for safety. Pt then taken down to therapy gym for weightbearing and e-stim on L triceps to increase strength and function in LUE. Settings on L tricep muscles at 25 maCC for 15 mins 10 secs on 10 secs off. Pt tolerated tx well, completed all stand pivot transfers with CGA. Pt requires min cuing to attend to LUE. After tx, pt helped back to recliner and left with alarm on and all needs met.   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 Mild L inattention Restrictions Weight Bearing Restrictions: No Pain: none     Therapy/Group: Individual Therapy  Magdalynn Davilla 06/27/2020, 1:16 PM

## 2020-06-28 DIAGNOSIS — I10 Essential (primary) hypertension: Secondary | ICD-10-CM

## 2020-06-28 LAB — GLUCOSE, CAPILLARY
Glucose-Capillary: 141 mg/dL — ABNORMAL HIGH (ref 70–99)
Glucose-Capillary: 107 mg/dL — ABNORMAL HIGH (ref 70–99)
Glucose-Capillary: 123 mg/dL — ABNORMAL HIGH (ref 70–99)
Glucose-Capillary: 167 mg/dL — ABNORMAL HIGH (ref 70–99)

## 2020-06-28 MED ORDER — QUETIAPINE FUMARATE 25 MG PO TABS
12.5000 mg | ORAL_TABLET | Freq: Every day | ORAL | Status: DC
  Administered 2020-06-28 – 2020-07-03 (×6): 12.5 mg via ORAL
  Filled 2020-06-28 (×6): qty 1

## 2020-06-28 NOTE — Progress Notes (Signed)
Occupational Therapy Session Note  Patient Details  Name: Frank Luna MRN: [id number] Date of Birth: Jul 26, 1960  Today's Date: 06/28/2020 OT Individual Time: 9290-9030 OT Individual Time Calculation (min): 58 min    Short Term Goals: Week 1:  OT Short Term Goal 1 (Week 1): Pt will don a tshirt with min A. OT Short Term Goal 2 (Week 1): Pt will don pants over feet min A. OT Short Term Goal 3 (Week 1): Pt will pull pants over hips with CGA for balance. OT Short Term Goal 4 (Week 1): Pt will use a long handled sponge as needed for washing feet and R arm. OT Short Term Goal 5 (Week 1): Pt will be able to use L hand as a stabilizing A with dressing tasks.  Skilled Therapeutic Interventions/Progress Updates:    Pt received in recliner and consented to OT tx. Pt completed SPT from recliner to w/c with CGA. Session focused on LUE NMR this day. Instructed pt in towel slides forward and backward and windshield wipers for 3x15 each with HOH assist from [place]. NMES applied to pt's L biceps at 25mCC 10/20, for 15 mins. Instructed to flex elbow when e-stim activating biceps and to assist with R hand for NMR and improved strength and tone in LUE. Pt instructed in standing activity to increase standing tolerance and balance for ADLs. Pt instructed in checkers game for improved standing tolerance, functional reach, and executive functioning. Cuing to put weight through LLE. During seated rest break, pt instructed in self ROM techs to reduce risk for contractures. Instructed in elbow flexion, and shoulder flexion for 3x10. After tx, pt helped back to recliner and left with all needs met, chair alarm on.   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 Mild L inattention Restrictions Weight Bearing Restrictions: No    Vital Signs: Therapy Vitals Pulse Rate: 80 Pain: Pain Assessment Pain Scale: 0-10 Pain Score: 0-No pain   Therapy/Group:  Individual Therapy  Britney Newstrom 06/28/2020, 9:16 AM

## 2020-06-28 NOTE — Progress Notes (Signed)
Patient ID: Frank Luna, male   DOB: [birth year], 60 y.o.   MRN: [id number] Spoke with wife via telephone to inform of team conference goals of supervision-CGA level and target discharge 5/19. She is aware if she comes up here he will expect her tot take him home. She plans to not come for a few more days. She feels he is mean to her and doesn't like this. She reports he has always been like this,taking his anger out on her. They have been married over thirty years and feels safe with him. Have told him he needs to stay a few more days will not share DC date with since this would set him off on calling wife to come get him. He is calmer today and not voicing leaving. She wants his family to help her but they will not. She did receive the wheelchair that was ordered and will get a rolling walker from one of her friends for him to use. Will try to find a home health agency to take his medicaid. Continue to work with pt as long as he will stay here. Wife plans to come in Friday or over the weekend.

## 2020-06-28 NOTE — Progress Notes (Signed)
`Physical Therapy Session Note  Patient Details  Name: Frank Luna MRN: [id number] Date of Birth: 05/19/1960  Today's Date: 06/28/2020 PT Individual Time: 6160-7371 + 1015-1055 + 1130-1156 PT Individual Time Calculation (min): 59 min  + 40 min + 26 min  Short Term Goals: Week 1:  PT Short Term Goal 1 (Week 1): Pt will complete bed mobility with CGA and no hospital bed features PT Short Term Goal 2 (Week 1): Pt will complete bed<>chair transfers with CGA and LRAD PT Short Term Goal 3 (Week 1): Pt will ambulated 16ft with minA and LRAD PT Short Term Goal 4 (Week 1): Pt will initiate stair training  Skilled Therapeutic Interventions/Progress Updates:     1st session: Pt received sitting in recliner. Agreeable to PT tx without reports of pain. Reports poor nights rest and generalized fatigue. Pt is upset that his L arm is not regaining strength as quickly as he would like. Educated on stroke recovery and long window for returns. Pt donned slip-on shoes with setupA while seated in recliner. Sit<>Stand with CGA and no AD and ambulated short distances in his room with minA and no AD to w/c. Pt requesting to be wheeled sinkside to wash his hands and brush his teeth. He requires minA for opening/closing toothpaste twist-on cap. W/c transport to day room gym for time management - instructed pt to have BLE in full knee extension using visual cues to keep legs even as he was transported in w/c. Gait training ~17ft with minA and RW - continues to have difficulty with LLE management and LLE clearance - worsens after ~50-75ft of gait with pt c/o L leg feeling "heavy." Sit<>supine on mat table with minA for L hemibody management. Completed the following supine there-ex: -2x15 bridges with assist for stabilizing LLE in neutral position -2x15 LLE straight leg hip abduction (AAROM for off-lifting) -2x15 LLE knee to chest -2x20 sit<>Stands with supervision from mat table with no AD - used mirror for visual  feedback to encourage equal weight shift through BLE as pt favors RLE(non paretic side).  Stand<>pivot transfer with CGA back to his w/c with cues needed for BUE placement. Wheeled back to room with totalA in similar fashion as above. Ambulated short distances within his room with minA and no AD to recliner, needed minA and verbal cues for safety approach. Pt remained seated in recliner with chair alarm on and needs within reach at end of session.    2nd session: Pt greeted seated in recliner, agreeable to PT . No reports of pain but continues to report fatigue from poor nights rest. Stand<>pivot transfer with minA from recliner to w/c. Instructed to keep BLE elevated in extension during w/c transport to day room rehab gym. Focused remainder of session on NMR for gait training with LiteGait over Treadmill. Applied harness while seated in w/c and removed in similar fashion. Required minA for stepping onto treadmill.   Trial 1: 3 minutes and 45 seconds, 0.69mph, 45ft -Attached medium resistance cord to L hip to promote L hip flexion and reduce late stance extension. Demonstrated poor L foot clearance and excessive RLE stance time. Cues throughout for corrections.  Trial 2: 5 minutes, 0.2mph, 124ft -Cues for "driving" knee to chest on LLE to improve L hip flexion in swing. Demonstrated improved L foot clearance but flat steppage gait.   Able to step off of treadmill with minA and detached harness while standing, able to sit to w/c with CGA. Pt returned to room in w/c while keeping  BLE in extension like as above. Stand<>pivot transfer with minA back to recliner. Chair alarm activated, needs in reach.      3rd session: Pt greeted sitting in recliner, continues to report generalized fatigue but agreeable to PT session. No reports of pain. Stand<>pivot transfer with CGA from recliner to w/c and wheeled to ortho gym with totalA for time management. Completed stand<>pivot with CGA to [place]ust[place] and instructed  pt on purpose and functions. Pt completed 10 minutes at workload 3, initially using RUE + BLE, progressing to BLE for purposes of strengthening. Pt needing assist for stabilizing his LLE in neutral due to abduction moment and ability to correct but inability to maintain. Instructed him to use his RUE to assist stabilizing LLE which improved his cadence and stepping patterns. Stand<>pivot with minA back to his w/c and wheeled back to room. Stand<>pivot transfer with use of bed rail from w/c to EOB, completed with CGA. Able to "kick" his shoes off while seated EOB and completed sit>Supine with supervision. Bed alarm on and needs within reach at end of session. LUE supported with pillows.  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 Mild L inattention Restrictions Weight Bearing Restrictions: No General:    Therapy/Group: Individual Therapy  Orrin Brigham 06/28/2020, 7:36 AM

## 2020-06-28 NOTE — Patient Care Conference (Signed)
Inpatient RehabilitationTeam Conference and Plan of Care Update Date: 06/28/2020   Time: 11:12 AM    Patient Name: Frank Luna      Medical Record Number: [id number]  Date of Birth: 04-25-1960 Sex: Male         Room/Bed: 4W21C/4W21C-01 Payor Info: Payor: [place] MEDICAID PREPAID HEALTH PLAN / Plan: [place] MEDICAID [facility] / Product Type: *No Product type* /    Admit Date/Time:  06/21/2020  1:08 PM  Primary Diagnosis:  Acute cerebral infarction [facility])  Hospital Problems: Principal Problem:   Acute cerebral infarction [facility]) Active Problems:   Small vessel disease, cerebrovascular   Hypoalbuminemia due to protein-calorie malnutrition (HCC)   Uncontrolled type 2 diabetes mellitus with hyperglycemia (HCC)   Monoparesis of arm (HCC)   Noncompliance   Benign essential HTN    Expected Discharge Date: Expected Discharge Date: 07/06/20  Team Members Present: Physician leading conference: Dr. Maryla Morrow Care Coodinator Present: Chana Bode, RN, BSN, CRRN;Becky Dupree, LCSW Nurse Present: Chana Bode, RN PT Present: Wynelle Link, PT OT Present: [name], OT SLP Present: Other (comment) Fae Pippin, SLP) PPS Coordinator present : Fae Pippin, SLP     Current Status/Progress Goal Weekly Team Focus  Bowel/Bladder             Swallow/Nutrition/ Hydration      n/a      ADL's   min A UBD and bathing, min-mod A LBD and bathing, CGA toileting, limited by L side weakness, impulsive  SUP-CGA  LUE NMR, safety awareness, balance, standing tolerance, ADL training   Mobility   CGA sit<>stand transfers, minA stand<>pivot transfers, gait ~140ft minA and RW. 4 steps with minA. LiteGait over treadmill >449ft  Supervision  LLE NMR, functional transfers, safety awareness, functional mobility, gait training with LRAD, standing balance, DC planning, pt education   Communication      n/a patient declined      Safety/Cognition/ Behavioral Observations     n/a patient declined       Pain             Skin               Discharge Planning:  Home with wife who can do supervision due to has health issues of her own. Pt insistent on going home ASAP, will have safety issues and be high risk to fall at DC   Team Discussion: Poor awareness of deficits, poor safety awareness and perseverates on going home/AMA discharge. MD adjusted medications. Progress limited by decreased participation, fatigue, insomnia, and behavior. Patient on target to meet rehab goals: Currently able to stand with CGA and ambulate with a RW and min assist. CGA for toileting, transfers and ADLs.  *See Care Plan and progress notes for long and short-term goals.   Revisions to Treatment Plan:  Declined SLP services; discontinued. Treadmill therapy and light gait training Neuro re-education  Teaching Needs: Safety measures, medication management, secondary stroke management and dietary modifications, toileting, transfers, etc.   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 secondary to small vessel disease as well as history of prior stroke 2017 with minimal residual weakness  Barriers to Discharge: Medical stability;Decreased family/caregiver support;Medication compliance  Barriers to Discharge Comments: Agitation, wanting to leave Possible Resolutions to [employer] Focus: Therapies, follow labs - Plts, optimize BP/DM  meds   Continued Need for Acute Rehabilitation Level of Care: The patient requires daily medical management by a physician with specialized training in physical medicine and rehabilitation for the following reasons: Direction of a multidisciplinary physical rehabilitation program to maximize functional independence :  Yes Medical management of patient stability for increased activity during participation in an intensive rehabilitation regime.: Yes Analysis of laboratory values and/or radiology reports with any subsequent need for medication adjustment and/or medical intervention. : Yes   I attest that I was present, lead the team conference, and concur with the assessment and plan of the team.   Chana Bode B 06/28/2020, 2:11 PM

## 2020-06-28 NOTE — Progress Notes (Addendum)
[place] PHYSICAL MEDICINE & REHABILITATION PROGRESS NOTE  Subjective/Complaints: Patient seen sitting up in his chair working with therapies this AM.  He states he slept well overnight.  He states he was able to move his LUE this AM, but cannot currently.  Discussed equipment with patient and therapies.   ROS: Denies CP, SOB, N/V/D  Objective: Vital Signs: Blood pressure (!) 145/64, pulse 80, temperature 97.8 F (36.6 C), temperature source Oral, resp. rate 20, height 5' 7.5" (1.715 m), weight 93.4 kg, SpO2 98 %. No results found. Recent Labs    06/26/20 1025  WBC 7.2  HGB 17.9*  HCT 54.7*  PLT 118*   Recent Labs    06/26/20 0536  NA 136  K 4.4  CL 100  CO2 28  GLUCOSE 150*  BUN 22*  CREATININE 1.08  CALCIUM 9.2    Intake/Output Summary (Last 24 hours) at 06/28/2020 1015 Last data filed at 06/28/2020 0900 Gross per 24 hour  Intake 714 ml  Output 650 ml  Net 64 ml       Physical Exam: BP (!) 145/64 (BP Location: Right Arm)   Pulse 80   Temp 97.8 F (36.6 C) (Oral)   Resp 20   Ht 5' 7.5" (1.715 m)   Wt 93.4 kg   SpO2 98%   BMI 31.76 kg/m  Constitutional: No distress . Vital signs reviewed. HENT: Normocephalic.  Atraumatic. Eyes: EOMI. No discharge. Cardiovascular: No JVD.  RRR. Respiratory: Normal effort.  No stridor.  Bilateral clear to auscultation. GI: Non-distended.  BS +. Skin: Warm and dry.  Intact. Psych: Normal mood.  Normal behavior. Musc: No edema in extremities.  No tenderness in extremities. Neuro: Alert Motor: RUE/RLE: 5/5 proximal distal LLE: 5/5 proximal distal, unchanged LUE: Shoulder abduction 2/5, elbow flexion/extension 2/5, distally 2-/5 with apraxia, slowly improving  Assessment/Plan: 1. Functional deficits which require 3+ hours per day of interdisciplinary therapy in a comprehensive inpatient rehab setting.  Physiatrist is providing close team supervision and 24 hour management of active medical problems listed  below.  Physiatrist and rehab team continue to assess barriers to discharge/monitor patient progress toward functional and medical goals   Care Tool:  Bathing    Body parts bathed by patient: Chest,Abdomen,Left arm,Front perineal area,Right upper leg,Left upper leg,Face,Buttocks,Right lower leg,Left lower leg   Body parts bathed by helper: Right arm     Bathing assist Assist Level: Minimal Assistance - Patient > 75%     Upper Body Dressing/Undressing Upper body dressing   What is the patient wearing?: Pull over shirt    Upper body assist Assist Level: Minimal Assistance - Patient > 75%    Lower Body Dressing/Undressing Lower body dressing      What is the patient wearing?: Underwear/pull up,Pants     Lower body assist Assist for lower body dressing: Minimal Assistance - Patient > 75%     Toileting Toileting    Toileting assist Assist for toileting: Supervision/Verbal cueing     Transfers Chair/bed transfer  Transfers assist     Chair/bed transfer assist level: Contact Guard/Touching assist     Locomotion Ambulation   Ambulation assist      Assist level: Minimal Assistance - Patient > 75% Assistive device: Walker-rolling Max distance: 135ft   Walk 10 feet activity   Assist     Assist level: Minimal Assistance - Patient > 75% Assistive device: Walker-rolling   Walk 50 feet activity   Assist Walk 50 feet with 2 turns activity did not  occur: Safety/medical concerns  Assist level: Minimal Assistance - Patient > 75% Assistive device: Walker-rolling    Walk 150 feet activity   Assist Walk 150 feet activity did not occur: Safety/medical concerns  Assist level: Minimal Assistance - Patient > 75% Assistive device: Walker-rolling    Walk 10 feet on uneven surface  activity   Assist Walk 10 feet on uneven surfaces activity did not occur: Safety/medical concerns         Wheelchair     Assist Will patient use wheelchair at  discharge?: No   Wheelchair activity did not occur: N/A         Wheelchair 50 feet with 2 turns activity    Assist    Wheelchair 50 feet with 2 turns activity did not occur: N/A       Wheelchair 150 feet activity     Assist  Wheelchair 150 feet activity did not occur: N/A        Medical Problem List and Plan: 1.  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 Please see conference note from today as well.  2.  Antithrombotics: -DVT/anticoagulation: Lovenox             -antiplatelet therapy: Aspirin 81 mg daily and Plavix 75 mg daily for 3 weeks total then aspirin alone 3. Pain Management: Tylenol as needed 4. Mood: Provide emotional support             -antipsychotic agents: Seroquel started on 5/10 ?with some improvement 5. Neuropsych: This patient is? capable of making decisions on his own behalf. 6. Skin/Wound Care: Routine skin checks 7. Fluids/Electrolytes/Nutrition: Routine in and outs.               CMP ordered for tomorrow  5/8- will check BMP tomorrow AM 8.  Hypertension.  Lisinopril 40 mg daily, Toprol-XL 75 mg daily.    Slightly elevated on 5/11             Monitor with increased mobility 9.  Hyperlipidemia: Lipitor 10. Uncontrolled diabetes mellitus with hyperglycemia.  Hemoglobin A1c 8.3.  SSI.  Presently on Lantus insulin 12 units nightly.  Patient also on farxiga 10 mg daily prior to admission.    Glucotrol  5 daily (10 mg twice daily  PTA) started on 5/10  Elevated on 5/11, will not make changes todaygiven recent addition            Monitor with increased mobility 11.  History of tobacco use.  Provide counseling 12.  Thrombocytopenia              Platelets 118 on 5/9  Continue to monitor 13.  Hypoalbuminemia  Supplement initiated on 5/5 14. Poor safety awareness with noncompliance  LOS: 7 days A FACE TO FACE EVALUATION WAS PERFORMED  Frank Luna [name] 06/28/2020, 10:15 AM

## 2020-06-29 LAB — GLUCOSE, CAPILLARY
Glucose-Capillary: 122 mg/dL — ABNORMAL HIGH (ref 70–99)
Glucose-Capillary: 83 mg/dL (ref 70–99)
Glucose-Capillary: 124 mg/dL — ABNORMAL HIGH (ref 70–99)
Glucose-Capillary: 125 mg/dL — ABNORMAL HIGH (ref 70–99)

## 2020-06-29 NOTE — Progress Notes (Signed)
Physical Therapy Weekly Progress Note  Patient Details  Name: Frank Luna MRN: [id number] Date of Birth: [birth year]  Beginning of progress report period: Jun 22, 2020 End of progress report period: Jun 29, 2020  Today's Date: 06/29/2020 PT Individual Time: 1100-1155 PT Individual Time Calculation (min): 55 min   Patient has met 3 of 4 short term goals.  Pt making appropriate progress towards goals. He continues to be primarily limited by L hemibody weakness (LUE > LLE), decreased safety awareness, poor insight into deficits, dynamic standing balance, and gait impairments. He requires CGA for bed mobility, CGA for sit<>stand transfers, CGA for stand<>pivot transfers, and has ambulated ~151f with minA and RW. He continues to threaten to leave AMA and this has impacted participation to a degree.   Patient continues to demonstrate the following deficits muscle weakness, decreased cardiorespiratoy endurance, impaired timing and sequencing, abnormal tone, unbalanced muscle activation and decreased coordination, decreased attention to left and decreased motor planning, decreased awareness, decreased problem solving, decreased safety awareness and decreased memory and decreased standing balance, decreased postural control, hemiplegia and decreased balance strategies and therefore will continue to benefit from skilled PT intervention to increase functional independence with mobility.  Patient progressing toward long term goals.  Continue plan of care.  PT Short Term Goals Week 1:  PT Short Term Goal 1 (Week 1): Pt will complete bed mobility with CGA and no hospital bed features PT Short Term Goal 1 - Progress (Week 1): Met PT Short Term Goal 2 (Week 1): Pt will complete bed<>chair transfers with CGA and LRAD PT Short Term Goal 2 - Progress (Week 1): Met PT Short Term Goal 3 (Week 1): Pt will ambulated 1570fwith minA and LRAD PT Short Term Goal 3 - Progress (Week 1): Not met PT Short Term Goal 4  (Week 1): Pt will initiate stair training PT Short Term Goal 4 - Progress (Week 1): Met Week 2:  PT Short Term Goal 1 (Week 2): STG = LTG due to ELOS  Skilled Therapeutic Interventions/Progress Updates:     1st session: Pt greeted seated in recliner, agreeable to PT session. No reports of pain. Sit<>stand with CGA and no AD from recliner height, cues for awareness of L foot placement. Ambulated short distances in room with minA and no AD with cues for increasing L step length and foot clearance. W/c transport for time management to main rehab gym where he performed additoinal stand<>pivot transfer with CGA and no AD to mat table. Gait training with no AD [id number] minA - similar deficits and cues as listed above. Stair training completed x12 steps with minA and 1 hand rail on R. Cues needed for step-to pattern with R foot leading ascent and L foot leading descent. Completed eccentric step downs from [place] hand rail support, working on LLE quad facilitation/strengthening. Completed 2x20 reps. Pt then instructed on alternating toe taps onto 8inch curb with no UE support and modA for balance. Pt unable to lift RLE onto 8inch curb due to LLE instability. Therefore, completed unilateral toe taps with LLE only and min/modA for standing balance, 1x10 reps.   2nd session: Pt received seated in recliner with chair alarm on, agreeable to PT session without reports of pain throughout. Sit<.stand with CGA and ambulated short distances with minA and no AD to w/c with cues needed for safety approach to sitting surfaces. W/c transport to main rehab gym for time management. Focused session on functional gait training with RW (LUE hand  splint attached to RW). Gait training 166f + 2157f+ 12566f 125f9fth mostly CGA and RW (needs intermittent minA for turns and CGA for straight path).  Gait fading to a heavy minA with fatigue. Completed TUG with RW and results listed below. TUG > 13.5 seconds indicates  increased falls risk. His TUG score is impacted most by time to apply LUE to orthotic on RW and also time to turn. Completed furniture transfers on/off low sitting sofa couch with CGA and RW. He was also able to complete bed mobility on regular height and flat bed without bed rails with CGA, including rolling L<>R and supine<>sit. Pt ended session seated in recliner with chair alarm activated and needs within reach.  TUG 1) 66 sec 2) 63 sec 3) 86 sec  Therapy Documentation Precautions:  Precautions Precautions: Fall Precaution Comments: L hemi (LUE>LLE). Mild L inattention Restrictions Weight Bearing Restrictions: No General:   Therapy/Group: Individual Therapy  Marta Bouie P Ariel Wingrove  PT 06/29/2020, 7:37 AM

## 2020-06-29 NOTE — Progress Notes (Signed)
[place] PHYSICAL MEDICINE & REHABILITATION PROGRESS NOTE  Subjective/Complaints:  No complaints Vitals stable Asks for snack Asks what his discharge date is, educated 5/19  ROS: Denies CP, SOB, N/V/D  Objective: Vital Signs: Blood pressure 132/87, pulse 68, temperature 97.6 F (36.4 C), temperature source Oral, resp. rate 20, height 5' 7.5" (1.715 m), weight 93.4 kg, SpO2 96 %. No results found. No results for input(s): WBC, HGB, HCT, PLT in the last 72 hours. No results for input(s): NA, K, CL, CO2, GLUCOSE, BUN, CREATININE, CALCIUM in the last 72 hours.  Intake/Output Summary (Last 24 hours) at 06/29/2020 1322 Last data filed at 06/28/2020 1836 Gross per 24 hour  Intake 240 ml  Output --  Net 240 ml       Physical Exam: BP 132/87   Pulse 68   Temp 97.6 F (36.4 C) (Oral)   Resp 20   Ht 5' 7.5" (1.715 m)   Wt 93.4 kg   SpO2 96%   BMI 31.76 kg/m  Gen: no distress, normal appearing HEENT: oral mucosa pink and moist, NCAT Cardio: Reg rate Chest: normal effort, normal rate of breathing Abd: soft, non-distended Ext: no edema Psych: Normal mood.  Normal behavior. Musc: No edema in extremities.  No tenderness in extremities. Neuro: Alert Motor: RUE/RLE: 5/5 proximal distal LLE: 5/5 proximal distal, unchanged LUE: Shoulder abduction 2/5, elbow flexion/extension 2/5, distally 2-/5 with apraxia, slowly improving  Assessment/Plan: 1. Functional deficits which require 3+ hours per day of interdisciplinary therapy in a comprehensive inpatient rehab setting.  Physiatrist is providing close team supervision and 24 hour management of active medical problems listed below.  Physiatrist and rehab team continue to assess barriers to discharge/monitor patient progress toward functional and medical goals   Care Tool:  Bathing    Body parts bathed by patient: Chest,Abdomen,Left arm,Front perineal area,Right upper leg,Left upper leg,Face,Buttocks,Right lower leg,Left lower  leg   Body parts bathed by helper: Right arm     Bathing assist Assist Level: Minimal Assistance - Patient > 75%     Upper Body Dressing/Undressing Upper body dressing   What is the patient wearing?: Pull over shirt    Upper body assist Assist Level: Minimal Assistance - Patient > 75%    Lower Body Dressing/Undressing Lower body dressing      What is the patient wearing?: Underwear/pull up,Pants     Lower body assist Assist for lower body dressing: Minimal Assistance - Patient > 75%     Toileting Toileting    Toileting assist Assist for toileting: Supervision/Verbal cueing     Transfers Chair/bed transfer  Transfers assist     Chair/bed transfer assist level: Contact Guard/Touching assist     Locomotion Ambulation   Ambulation assist      Assist level: Minimal Assistance - Patient > 75% Assistive device: Walker-rolling Max distance: 173ft   Walk 10 feet activity   Assist     Assist level: Minimal Assistance - Patient > 75% Assistive device: Walker-rolling   Walk 50 feet activity   Assist Walk 50 feet with 2 turns activity did not occur: Safety/medical concerns  Assist level: Minimal Assistance - Patient > 75% Assistive device: Walker-rolling    Walk 150 feet activity   Assist Walk 150 feet activity did not occur: Safety/medical concerns  Assist level: Minimal Assistance - Patient > 75% Assistive device: Walker-rolling    Walk 10 feet on uneven surface  activity   Assist Walk 10 feet on uneven surfaces activity did not occur: Safety/medical  concerns         Wheelchair     Assist Will patient use wheelchair at discharge?: No   Wheelchair activity did not occur: N/A         Wheelchair 50 feet with 2 turns activity    Assist    Wheelchair 50 feet with 2 turns activity did not occur: N/A       Wheelchair 150 feet activity     Assist  Wheelchair 150 feet activity did not occur: N/A        Medical  Problem List and Plan: 1.  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  Impaired mobility -DVT/anticoagulation: Continue Lovenox             -antiplatelet therapy: Aspirin 81 mg daily and Plavix 75 mg daily for 3 weeks total then aspirin alone 3. Pain Management: Tylenol as needed 4. Mood: Provide emotional support             -antipsychotic agents: Seroquel started on 5/10 ?with some improvement 5. Neuropsych: This patient is? capable of making decisions on his own behalf. 6. Skin/Wound Care: Routine skin checks 7. Fluids/Electrolytes/Nutrition: Routine in and outs.               CMP ordered for tomorrow  5/8- will check BMP tomorrow AM 8.  Hypertension.  Continue Lisinopril 40 mg daily, Toprol-XL 75 mg daily.    Well controlled 5/12             Monitor with increased mobility 9.  Hyperlipidemia: Lipitor 10. Uncontrolled diabetes mellitus with hyperglycemia.  Hemoglobin A1c 8.3.  SSI.  Presently on Lantus insulin 12 units nightly.  Patient also on farxiga 10 mg daily prior to admission.    Glucotrol  5 daily (10 mg twice daily PTA) started on 5/10  Slightly elevated on 5/12, will not increase glucotrol since low of 83.             Monitor with increased mobility 11.  History of tobacco use.  Provide counseling 12.  Thrombocytopenia              Platelets 118 on 5/9  Continue to monitor 13.  Hypoalbuminemia  Supplement initiated on 5/5 14. Poor safety awareness with noncompliance  LOS: 8 days A FACE TO FACE EVALUATION WAS PERFORMED  Clint Bolder P Serapio Edelson 06/29/2020, 1:22 PM

## 2020-06-29 NOTE — Progress Notes (Signed)
Occupational Therapy Session Note  Patient Details  Name: Frank Luna MRN: [id number] Date of Birth: 09-Apr-1960  Today's Date: 06/29/2020 OT Individual Time: 1400-1502 OT Individual Time Calculation (min): 62 min    Short Term Goals: Week 1:  OT Short Term Goal 1 (Week 1): Pt will don a tshirt with min A. OT Short Term Goal 2 (Week 1): Pt will don pants over feet min A. OT Short Term Goal 3 (Week 1): Pt will pull pants over hips with CGA for balance. OT Short Term Goal 4 (Week 1): Pt will use a long handled sponge as needed for washing feet and R arm. OT Short Term Goal 5 (Week 1): Pt will be able to use L hand as a stabilizing A with dressing tasks.  Skilled Therapeutic Interventions/Progress Updates:    Pt received in room in recliner and consented to OT tx. Pt seen for LUE NMR including towel slides for 3x10 forward, backward, and windshield wiper motion to increase ROM, strength, and decrease risk for contractures. Instructed in LUE elbow AAROM for 3x10 to increase bicep strength for ADLs. NMES SM AT applied to pt's L wrist extensors at 11maCC for 15 mins, 10/10 ramp 2 seconds. Instructed pt to extend wrist when e-stim activated. Instructed pt to use L gross grasp to squeeze yellow thera-ball for 3x10 for increased grasp strength. Pt assisted to toilet SPT with CGA, completed toileting with SUP and cuing for safety. After tx, pt left up in recliner with alarm on and all needs met.   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 Mild L inattention Restrictions Weight Bearing Restrictions: No   Vital Signs: Therapy Vitals Temp: 98.1 F (36.7 C) Temp Source: Oral Pulse Rate: (!) 54 Resp: 16 BP: (!) 120/50 Patient Position (if appropriate): Sitting Oxygen Therapy SpO2: 96 % O2 Device: Room Air Pain: none     Therapy/Group: Individual Therapy  Zaleah Ternes 06/29/2020, 2:37 PM

## 2020-06-30 LAB — GLUCOSE, CAPILLARY
Glucose-Capillary: 131 mg/dL — ABNORMAL HIGH (ref 70–99)
Glucose-Capillary: 103 mg/dL — ABNORMAL HIGH (ref 70–99)
Glucose-Capillary: 132 mg/dL — ABNORMAL HIGH (ref 70–99)
Glucose-Capillary: 187 mg/dL — ABNORMAL HIGH (ref 70–99)

## 2020-06-30 NOTE — Progress Notes (Signed)
Occupational Therapy Session Note  Patient Details  Name: Frank Luna MRN: [id number] Date of Birth: 06-Dec-1960  Today's Date: 06/30/2020 OT Individual Time: 0263-7858 OT Individual Time Calculation (min): 59 min     Skilled Therapeutic Interventions/Progress Updates:    Pt completed toilet transfer to start session with use of the RW with hand splint attached on the left side.  He was able to complete all aspects of toileting with min assist including ambulating out to the sink for washing his hands.  Therapist then took him down to the therapy gym where he transferred to the therapy mat with min assist stand pivot and no device.  Worked in quadriped on increased weightbearing over the LUE on the mat while working on functional reaching and washing the mat using the RUE.  Mod assist needed to maintain left elbow extension in weightbearing while completing these tasks.  Rest given in sidelying and in tall kneeling with min assist.  Transitioned to sitting where NMES was applied to the left digit extensors with intensity on level 32 and with on/off time 10 secs/5 secs, PPS at 35 and pulse width was 300.  He tolerated 12 mins without any adverse reactions.  Instructed pt to work on functional reaching toward target with mod assist to achieve elbow extension and shoulder flexion without shoulder hike compensation.  Returned to the wheelchair at the end of session with min assist and then back to the room where he transferred to the recliner.  Call button and phone in reach with pad alarm in place.    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 Mild L inattention Restrictions Weight Bearing Restrictions: No   Pain: Pain Assessment Pain Scale: Faces Pain Score: 0-No pain ADL: See Care Tool Section for some details of mobility and selfcare  Therapy/Group: Individual Therapy  Ulyess Muto OTR/L 06/30/2020, 4:29 PM

## 2020-06-30 NOTE — Progress Notes (Signed)
Physical Therapy Session Note  Patient Details  Name: Frank Luna MRN: [id number] Date of Birth: 12-21-1960  Today's Date: 06/30/2020 PT Individual Time: 0800-0900 PT Individual Time Calculation (min): 60 min   Short Term Goals: Week 2:  PT Short Term Goal 1 (Week 2): STG = LTG due to ELOS  Skilled Therapeutic Interventions/Progress Updates:    Pt greeted seated in reclinre, agreeable to PT session. No reports of pain and reports good nights rest. Sit<>stand with CGA and no AD from recliner and ambulated short distances in his room with minA and no AD to his w/c needing min cues for safety approach.   W/c transport for time management to main rehab gym and transferred via stand pivot to mat table with CGA and no AD. Performed repeated sit<>stands from mat table with supervision - pt favoring RLE with decreased LLE weight shift. Provided mirror for visual feedback and also placed 4inch platform under LLE to promote weight bearing - performed additional repeated sit<>stands with supervision in similar fashion but improved LLE weight shift.   Gait training ~269ft with mostly CGA and RW (intermittent minA with turns and with fatigue) - cues for L foot clearance and increased L knee flexors to assist with clearance - able to initiate correction but difficulty maintaining during gait cycle. Performed seated hamstring curls and isometric (5 second holds) knee flexion exercises on LLE in available ROM to isolate L knee flexors. Attempted to perform standing hamstring curls with visual cue for tapping toe backwards on 2inch platform but pt over compensating with glut's and trunk flexion, unable to isolate hamstring activation.   Stand<>pivot transfer with minA and no AD (towards paretic L side) to w/c from mat table. Transported to day room rehab gym where he performed stand<>pivot with minA to Nustep.   Completed 6 minutes of Nustep at workload 4, using BLE's only and using his RUE to assist maintaining  L knee in neutral position. Maintained cadence of ~35 steps/minute. Rest break needed at 72min30 seconds & 22 seconds.   Stand<>pivot with minA towards L side off of Nustep to w/c and handoff of care to OT with patient sitting in w/c.   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 Mild L inattention Restrictions Weight Bearing Restrictions: No General:    Therapy/Group: Individual Therapy  Frank Luna PT 06/30/2020, 7:30 AM

## 2020-06-30 NOTE — Progress Notes (Signed)
Patient ID: Frank Luna, male   DOB: 1960-08-06, 60 y.o.   MRN: [id number] Spoke with wife via telephone who plans for pt to stay until his discharge date of 5/18. She will come in with daughter on Tuesday at 10:00 for education in preparation of discharge Thursday. She is aware with her being here pt may insists on leaving and she does not plan to take him then. Continue to work on discharge needs. Have yet to find a home health agency to take his [employer] medicaid. May need a home exercise program for home.

## 2020-06-30 NOTE — Progress Notes (Signed)
Occupational Therapy Weekly Progress Note  Patient Details  Name: Frank Luna MRN: [id number] Date of Birth: 1960/12/26  Beginning of progress report period: Jun 22, 2020 End of progress report period: Jun 30, 2000  Today's Date: 06/30/2020 OT Individual Time: 0902-1002 OT Individual Time Calculation (min): 60 min    Patient has met 3 of 5 short term goals.  Pt progressing slowly, continues to demo standing balance deficits, safety awareness deficits, and inattention to L side with low tone in LUE. Pt's strength slowly increasing in LUE and now can form gross grasp. Pt continues to require cuing to attend to L side of body and for safety and steps during functional transfers.   Patient continues to demonstrate the following deficits: muscle weakness, abnormal tone, unbalanced muscle activation and decreased coordination and decreased attention and decreased safety awareness and therefore will continue to benefit from skilled OT intervention to enhance overall performance with BADL.  Patient progressing toward long term goals..  Continue plan of care.  OT Short Term Goals Week 1:  OT Short Term Goal 1 (Week 1): Pt will don a tshirt with min A. OT Short Term Goal 2 (Week 1): Pt will don pants over feet min A. OT Short Term Goal 3 (Week 1): Pt will pull pants over hips with CGA for balance. OT Short Term Goal 4 (Week 1): Pt will use a long handled sponge as needed for washing feet and R arm. OT Short Term Goal 5 (Week 1): Pt will be able to use L hand as a stabilizing A with dressing tasks.  Skilled Therapeutic Interventions/Progress Updates:    Pt received from PT and consented to OT tx. Pt refused shower this day, instructed in towel slides forward and backward with trunk flexion, and windshield wipers with trunk rotation for increased core strength for ADLs and to improve LUE function. Pt instructed in simulated LB dressing task to practice donning pants. Pt able to thread feet through  theraband loop, stand with CGA due to 1 LOB. Pt able to self correct with RW and CGA. Pt instructed in standing activity with cuing for L hand placement on RW and to strap in handle while standing, unstrap before siting. Standing task included pt using R hand to cross out numbers from 1-30 for sequencing and visual scanning. Pt able to maintain standing balance with CGA. Once seated, pt instructed in blue hand gripper sponge exercises, instructed in hand squeezes for 3x10. Pt transferred to [facility] with CGA, instructed in weightbearing actiivty through LUE with therapist blocking L elbow. Instructed to push through LUE for 5x10 seconds to increase strength and improve tone and function in LUE. After tx, pt helped back to recliner in room and left with alarm on and all needs met.   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 Mild L inattention Restrictions Weight Bearing Restrictions: No Vital Signs: Therapy Vitals Temp: 97.7 F (36.5 C) Temp Source: Oral Pulse Rate: 68 Resp: 16 BP: 138/75 Patient Position (if appropriate): Lying Oxygen Therapy SpO2: 93 % O2 Device: Room Air Pain: none  Therapy/Group: Individual Therapy  Chamya Hunton 06/30/2020, 8:18 AM

## 2020-06-30 NOTE — Progress Notes (Signed)
[place] PHYSICAL MEDICINE & REHABILITATION PROGRESS NOTE  Subjective/Complaints: Patient seen sitting up in his chair this AM.  He states he slept well overnight.  He states he does not want to leave Thurs and is willing to stay until Tues. Discussed with patient and therapies. Wife states she cannot take patient home until Thurs.   ROS: Denies CP, SOB, N/V/D  Objective: Vital Signs: Blood pressure 138/75, pulse 68, temperature 97.7 F (36.5 C), temperature source Oral, resp. rate 16, height 5' 7.5" (1.715 m), weight 93.4 kg, SpO2 93 %. No results found. No results for input(s): WBC, HGB, HCT, PLT in the last 72 hours. No results for input(s): NA, K, CL, CO2, GLUCOSE, BUN, CREATININE, CALCIUM in the last 72 hours.  Intake/Output Summary (Last 24 hours) at 06/30/2020 1058 Last data filed at 06/30/2020 0739 Gross per 24 hour  Intake 680 ml  Output --  Net 680 ml       Physical Exam: BP 138/75   Pulse 68   Temp 97.7 F (36.5 C) (Oral)   Resp 16   Ht 5' 7.5" (1.715 m)   Wt 93.4 kg   SpO2 93%   BMI 31.76 kg/m  Constitutional: No distress . Vital signs reviewed. HENT: Normocephalic.  Atraumatic. Eyes: EOMI. No discharge. Cardiovascular: No JVD.  RRR. Respiratory: Normal effort.  No stridor.  Bilateral clear to auscultation. GI: Non-distended.  BS +. Skin: Warm and dry.  Intact. Psych: Normal mood.  Normal behavior. Musc: No edema in extremities.  No tenderness in extremities. Neuro: Alert Motor: RUE/RLE: 5/5 proximal distal LLE: 5/5 proximal distal, stable LUE: Shoulder abduction 2/5, elbow flexion/extension 2/5, distally 2-/5 with apraxia, stable  Assessment/Plan: 1. Functional deficits which require 3+ hours per day of interdisciplinary therapy in a comprehensive inpatient rehab setting.  Physiatrist is providing close team supervision and 24 hour management of active medical problems listed below.  Physiatrist and rehab team continue to assess barriers to  discharge/monitor patient progress toward functional and medical goals   Care Tool:  Bathing    Body parts bathed by patient: Chest,Abdomen,Left arm,Front perineal area,Right upper leg,Left upper leg,Face,Buttocks,Right lower leg,Left lower leg   Body parts bathed by helper: Right arm     Bathing assist Assist Level: Minimal Assistance - Patient > 75%     Upper Body Dressing/Undressing Upper body dressing   What is the patient wearing?: Pull over shirt    Upper body assist Assist Level: Minimal Assistance - Patient > 75%    Lower Body Dressing/Undressing Lower body dressing      What is the patient wearing?: Underwear/pull up,Pants     Lower body assist Assist for lower body dressing: Minimal Assistance - Patient > 75%     Toileting Toileting    Toileting assist Assist for toileting: Supervision/Verbal cueing     Transfers Chair/bed transfer  Transfers assist     Chair/bed transfer assist level: Contact Guard/Touching assist     Locomotion Ambulation   Ambulation assist      Assist level: Minimal Assistance - Patient > 75% Assistive device: Walker-rolling Max distance: 151ft   Walk 10 feet activity   Assist     Assist level: Minimal Assistance - Patient > 75% Assistive device: Walker-rolling   Walk 50 feet activity   Assist Walk 50 feet with 2 turns activity did not occur: Safety/medical concerns  Assist level: Minimal Assistance - Patient > 75% Assistive device: Walker-rolling    Walk 150 feet activity   Assist Walk  150 feet activity did not occur: Safety/medical concerns  Assist level: Minimal Assistance - Patient > 75% Assistive device: Walker-rolling    Walk 10 feet on uneven surface  activity   Assist Walk 10 feet on uneven surfaces activity did not occur: Safety/medical concerns         Wheelchair     Assist Will patient use wheelchair at discharge?: No   Wheelchair activity did not occur: N/A          Wheelchair 50 feet with 2 turns activity    Assist    Wheelchair 50 feet with 2 turns activity did not occur: N/A       Wheelchair 150 feet activity     Assist  Wheelchair 150 feet activity did not occur: N/A        Medical Problem List and Plan: 1.  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  Impaired mobility -DVT/anticoagulation: Continue Lovenox             -antiplatelet therapy: Aspirin 81 mg daily and Plavix 75 mg daily for 3 weeks total then aspirin alone 3. Pain Management: Tylenol as needed 4. Mood: Provide emotional support             -antipsychotic agents: Seroquel started on 5/10 ?with some improvement 5. Neuropsych: This patient is? capable of making decisions on his own behalf. 6. Skin/Wound Care: Routine skin checks 7. Fluids/Electrolytes/Nutrition: Routine in and outs.               CMP ordered for tomorrow  5/8- will check BMP tomorrow AM 8.  Hypertension.  Continue Lisinopril 40 mg daily, Toprol-XL 75 mg daily.    Controlled on 5/13             Monitor with increased mobility 9.  Hyperlipidemia: Lipitor 10. Uncontrolled diabetes mellitus with hyperglycemia.  Hemoglobin A1c 8.3.  SSI.  Presently on Lantus insulin 12 units nightly.  Patient also on farxiga 10 mg daily prior to admission.    Glucotrol  5 daily (10 mg twice daily PTA) started on 5/10  Slightly labile on 5/13, will consider medications adjustments if persistent.             Monitor with increased mobility 11.   History of tobacco use.  Provide counseling 12.  Thrombocytopenia              Platelets 118 on 5/9, labs ordered for Monday  Continue to monitor 13.  Hypoalbuminemia  Supplement initiated on 5/5 14. Poor safety awareness with noncompliance  LOS: 9 days A FACE TO FACE EVALUATION WAS PERFORMED  Dennis Killilea [name] 06/30/2020, 10:58 AM

## 2020-07-01 LAB — GLUCOSE, CAPILLARY
Glucose-Capillary: 139 mg/dL — ABNORMAL HIGH (ref 70–99)
Glucose-Capillary: 159 mg/dL — ABNORMAL HIGH (ref 70–99)
Glucose-Capillary: 190 mg/dL — ABNORMAL HIGH (ref 70–99)

## 2020-07-01 NOTE — Progress Notes (Signed)
[place] PHYSICAL MEDICINE & REHABILITATION PROGRESS NOTE  Subjective/Complaints:  No issues overnite , family (Mom and wife) visiting  ROS: Denies CP, SOB, N/V/D  Objective: Vital Signs: Blood pressure 125/65, pulse (!) 55, temperature 98 F (36.7 C), resp. rate 18, height 5' 7.5" (1.715 m), weight 93.4 kg, SpO2 100 %. No results found. No results for input(s): WBC, HGB, HCT, PLT in the last 72 hours. No results for input(s): NA, K, CL, CO2, GLUCOSE, BUN, CREATININE, CALCIUM in the last 72 hours.  Intake/Output Summary (Last 24 hours) at 07/01/2020 1103 Last data filed at 07/01/2020 0759 Gross per 24 hour  Intake 640 ml  Output --  Net 640 ml       Physical Exam: BP 125/65 (BP Location: Right Arm)   Pulse (!) 55   Temp 98 F (36.7 C)   Resp 18   Ht 5' 7.5" (1.715 m)   Wt 93.4 kg   SpO2 100%   BMI 31.76 kg/m   General: No acute distress Mood and affect are appropriate Heart: Regular rate and rhythm no rubs murmurs or extra sounds Lungs: Clear to auscultation, breathing unlabored, no rales or wheezes Abdomen: Positive bowel sounds, soft nontender to palpation, nondistended Extremities: No clubbing, cyanosis, or edema Skin: No evidence of breakdown, no evidence of rash   Motor: RUE/RLE: 5/5 proximal distal LLE: 5/5 proximal distal, stable LUE: Shoulder abduction 2/5, elbow flexion/extension 2/5, distally 2-/5 with apraxia, stable  Assessment/Plan: 1. Functional deficits which require 3+ hours per day of interdisciplinary therapy in a comprehensive inpatient rehab setting.  Physiatrist is providing close team supervision and 24 hour management of active medical problems listed below.  Physiatrist and rehab team continue to assess barriers to discharge/monitor patient progress toward functional and medical goals   Care Tool:  Bathing    Body parts bathed by patient: Chest,Abdomen,Left arm,Front perineal area,Right upper leg,Left upper leg,Face,Buttocks,Right  lower leg,Left lower leg   Body parts bathed by helper: Right arm     Bathing assist Assist Level: Minimal Assistance - Patient > 75%     Upper Body Dressing/Undressing Upper body dressing   What is the patient wearing?: Pull over shirt    Upper body assist Assist Level: Minimal Assistance - Patient > 75%    Lower Body Dressing/Undressing Lower body dressing      What is the patient wearing?: Underwear/pull up,Pants     Lower body assist Assist for lower body dressing: Minimal Assistance - Patient > 75%     Toileting Toileting    Toileting assist Assist for toileting: Minimal Assistance - Patient > 75%     Transfers Chair/bed transfer  Transfers assist     Chair/bed transfer assist level: Contact Guard/Touching assist     Locomotion Ambulation   Ambulation assist      Assist level: Minimal Assistance - Patient > 75% Assistive device: Walker-rolling Max distance: 149ft   Walk 10 feet activity   Assist     Assist level: Minimal Assistance - Patient > 75% Assistive device: Walker-rolling   Walk 50 feet activity   Assist Walk 50 feet with 2 turns activity did not occur: Safety/medical concerns  Assist level: Minimal Assistance - Patient > 75% Assistive device: Walker-rolling    Walk 150 feet activity   Assist Walk 150 feet activity did not occur: Safety/medical concerns  Assist level: Minimal Assistance - Patient > 75% Assistive device: Walker-rolling    Walk 10 feet on uneven surface  activity   Assist Walk  10 feet on uneven surfaces activity did not occur: Safety/medical concerns         Wheelchair     Assist Will patient use wheelchair at discharge?: No   Wheelchair activity did not occur: N/A         Wheelchair 50 feet with 2 turns activity    Assist    Wheelchair 50 feet with 2 turns activity did not occur: N/A       Wheelchair 150 feet activity     Assist  Wheelchair 150 feet activity did not  occur: N/A        Medical Problem List and Plan: 1.  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  Impaired mobility -DVT/anticoagulation: Continue Lovenox             -antiplatelet therapy: Aspirin 81 mg daily and Plavix 75 mg daily for 3 weeks total then aspirin alone 3. Pain Management: Tylenol as needed 4. Mood: Provide emotional support             -antipsychotic agents: Seroquel started on 5/10 ?with some improvement 5. Neuropsych: This patient is? capable of making decisions on his own behalf. 6. Skin/Wound Care: Routine skin checks 7. Fluids/Electrolytes/Nutrition: Routine in and outs.               CMP ordered for tomorrow  5/8- will check BMP tomorrow AM 8.  Hypertension.  Continue Lisinopril 40 mg daily, Toprol-XL 75 mg daily.    Controlled on 5/13             Monitor with increased mobility 9.  Hyperlipidemia: Lipitor 10. Uncontrolled diabetes mellitus with hyperglycemia.  Hemoglobin A1c 8.3.  SSI.  Presently on Lantus insulin 12 units nightly.  Patient also on farxiga 10 mg daily prior to admission.    Glucotrol  5 daily (10 mg twice daily PTA) started on 5/10  Some pm elevation but otherwise good control , cont to monitor 5/14          CBG (last 3)  Recent Labs    06/30/20 1703 06/30/20 2057 07/01/20 0605  GLUCAP 132* 187* 139*    11.  History of tobacco use.  Provide counseling 12.  Thrombocytopenia              Platelets 118 on 5/9, labs ordered for Monday  Continue to monitor 13.   Hypoalbuminemia  Supplement initiated on 5/5 14. Poor safety awareness with noncompliance  LOS: 10 days A FACE TO FACE EVALUATION WAS PERFORMED  Erick Colace 07/01/2020, 11:03 AM

## 2020-07-02 LAB — GLUCOSE, CAPILLARY
Glucose-Capillary: 141 mg/dL — ABNORMAL HIGH (ref 70–99)
Glucose-Capillary: 168 mg/dL — ABNORMAL HIGH (ref 70–99)

## 2020-07-02 NOTE — Progress Notes (Signed)
Occupational Therapy Session Note  Patient Details  Name: Frank Luna MRN: [id number] Date of Birth: 1960/10/14  Today's Date: 07/02/2020 OT Individual Time: 1497-0263 OT Individual Time Calculation (min): 54 min    Short Term Goals: Week 1:  OT Short Term Goal 1 (Week 1): Pt will don a tshirt with min A. OT Short Term Goal 2 (Week 1): Pt will don pants over feet min A. OT Short Term Goal 3 (Week 1): Pt will pull pants over hips with CGA for balance. OT Short Term Goal 4 (Week 1): Pt will use a long handled sponge as needed for washing feet and R arm. OT Short Term Goal 5 (Week 1): Pt will be able to use L hand as a stabilizing A with dressing tasks. Week 2:     Skilled Therapeutic Interventions/Progress Updates:    1:1 Pt received in the recliner. NMR with focus on normal patterns of movement. Worked in both unsupported sitting and in supine in his room. Focus on wrist extension and flexion and grasp and release with max A for extension. Pt about to elicit grasp and able to relax but not extend wrist/ fingers. Pt able to achieve a weak pincher grasp and relax mm but again not able to extend back out.  Also performed shoulder abduction on table top to neutral and slightly past neutral (with target: pushing item off table top with slide of hand). In supine performed PNF patterns (D1 and D2) with assistance to achieve end ranges.  In supine, also performed bridging with focus on maintaining left hip at neurtral  with hip adduction. Pt worked hard throughout session to engage his body. Rest breaks offered as needed.   Pt was able to perform stand pivot transfers to and from the recliner<> bed with min guard.   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 Mild L inattention Restrictions Weight Bearing Restrictions: No General:   Vital Signs:  Pain: No c/o pain in session.   Therapy/Group: Individual Therapy  [name] [facility] 07/02/2020, 9:23 AM

## 2020-07-03 LAB — CBC WITH DIFFERENTIAL/PLATELET
Abs Immature Granulocytes: 0.01 10*3/uL (ref 0.00–0.07)
Basophils Absolute: 0.1 10*3/uL (ref 0.0–0.1)
Basophils Relative: 1 %
Eosinophils Absolute: 0.2 10*3/uL (ref 0.0–0.5)
Eosinophils Relative: 2 %
HCT: 51.3 % (ref 39.0–52.0)
Hemoglobin: 17.3 g/dL — ABNORMAL HIGH (ref 13.0–17.0)
Immature Granulocytes: 0 %
Lymphocytes Relative: 22 %
Lymphs Abs: 1.6 10*3/uL (ref 0.7–4.0)
MCH: 29.8 pg (ref 26.0–34.0)
MCHC: 33.7 g/dL (ref 30.0–36.0)
MCV: 88.3 fL (ref 80.0–100.0)
Monocytes Absolute: 0.7 10*3/uL (ref 0.1–1.0)
Monocytes Relative: 9 %
Neutro Abs: 4.8 10*3/uL (ref 1.7–7.7)
Neutrophils Relative %: 66 %
Platelets: 116 10*3/uL — ABNORMAL LOW (ref 150–400)
RBC: 5.81 MIL/uL (ref 4.22–5.81)
RDW: 12.8 % (ref 11.5–15.5)
WBC: 7.3 10*3/uL (ref 4.0–10.5)
nRBC: 0 % (ref 0.0–0.2)

## 2020-07-03 LAB — GLUCOSE, CAPILLARY
Glucose-Capillary: 154 mg/dL — ABNORMAL HIGH (ref 70–99)
Glucose-Capillary: 241 mg/dL — ABNORMAL HIGH (ref 70–99)
Glucose-Capillary: 129 mg/dL — ABNORMAL HIGH (ref 70–99)
Glucose-Capillary: 158 mg/dL — ABNORMAL HIGH (ref 70–99)
Glucose-Capillary: 163 mg/dL — ABNORMAL HIGH (ref 70–99)
Glucose-Capillary: 133 mg/dL — ABNORMAL HIGH (ref 70–99)
Glucose-Capillary: 152 mg/dL — ABNORMAL HIGH (ref 70–99)

## 2020-07-03 MED ORDER — METOPROLOL SUCCINATE ER 25 MG PO TB24: 75.0000 mg | ORAL_TABLET | Freq: Every day | ORAL | 0 refills | Status: DC

## 2020-07-03 MED ORDER — CLOPIDOGREL BISULFATE 75 MG PO TABS: 75.0000 mg | ORAL_TABLET | Freq: Every day | ORAL | 0 refills | Status: DC

## 2020-07-03 MED ORDER — NICOTINE 21 MG/24HR TD PT24: MEDICATED_PATCH | TRANSDERMAL | 0 refills | Status: DC

## 2020-07-03 MED ORDER — QUETIAPINE FUMARATE 25 MG PO TABS: 12.5000 mg | ORAL_TABLET | Freq: Every day | ORAL | 0 refills | Status: DC

## 2020-07-03 MED ORDER — GLIPIZIDE 5 MG PO TABS: 5.0000 mg | ORAL_TABLET | Freq: Every day | ORAL | 0 refills | Status: DC

## 2020-07-03 MED ORDER — LISINOPRIL 40 MG PO TABS: 40.0000 mg | ORAL_TABLET | Freq: Every day | ORAL | 0 refills | Status: DC

## 2020-07-03 MED ORDER — ATORVASTATIN CALCIUM 40 MG PO TABS: 40.0000 mg | ORAL_TABLET | Freq: Every day | ORAL | 3 refills | Status: DC

## 2020-07-03 MED ORDER — VITAMIN D3 25 MCG PO TABS: 1000.0000 [IU] | ORAL_TABLET | Freq: Every day | ORAL | 0 refills | Status: DC

## 2020-07-03 MED ORDER — DAPAGLIFLOZIN PROPANEDIOL 10 MG PO TABS: 10.0000 mg | ORAL_TABLET | Freq: Every day | ORAL | 0 refills | Status: DC

## 2020-07-03 MED ORDER — AMLODIPINE BESYLATE 5 MG PO TABS: 5.0000 mg | ORAL_TABLET | Freq: Every day | ORAL | 0 refills | Status: DC

## 2020-07-03 MED ORDER — INSULIN GLARGINE 100 UNIT/ML SOLOSTAR PEN: 12.0000 [IU] | PEN_INJECTOR | Freq: Every day | SUBCUTANEOUS | 11 refills | Status: DC

## 2020-07-03 MED ORDER — GLIPIZIDE 5 MG PO TABS
5.0000 mg | ORAL_TABLET | Freq: Two times a day (BID) | ORAL | Status: DC
  Administered 2020-07-03 – 2020-07-04 (×2): 5 mg via ORAL
  Filled 2020-07-03 (×2): qty 1

## 2020-07-03 MED ORDER — ACETAMINOPHEN 325 MG PO TABS: 650.0000 mg | ORAL_TABLET | Freq: Four times a day (QID) | ORAL | Status: DC | PRN

## 2020-07-03 NOTE — Progress Notes (Signed)
Inpatient Rehabilitation Care Coordinator Discharge Note  The overall goal for the admission was met for:   Discharge location: Yes-HOME WITH WIFE WHO CAN PROVIDE SUPERVISION LEVEL ONLY DUE TO OWN HEALTH ISSUES  Length of Stay: Yes  Discharge activity level: Yes-SUPERVISION-CGA LEVEL  Home/community participation: Yes  Services provided included: MD, RD, PT, OT, SLP, RN, CM, Pharmacy, Neuropsych and SW  Financial Services: Medicaid  Choices offered to/list presented to:pt and wife  Follow-up services arranged: DME: [facility] HAS ALL OTHER NEEDED EQUIPMENT and Patient/Family has no preference for HH/DME agencies UNABLE TO [place]. HOME EXERCISE PROGRAM GIVEN TO THEM. PCS REFERRAL MADE  Comments (or additional information):WIFE AND STEP-DAUGHTER WERE IN FOR EDUCATION AND SEE THE CARE PT REQUIRES AND FEELS COMFORTABLE WITH PROVIDING IT. READY FOR DC. ASKED TO DC TWO DAYS EARLIER DUE TO PT'S REQUEST  Patient/Family verbalized understanding of follow-up arrangements: Yes  Individual responsible for coordination of the follow-up plan: SHIRLEY-WIFE [phone]  Confirmed correct DME delivered: Elease Hashimoto 07/03/2020    Elease Hashimoto

## 2020-07-03 NOTE — Discharge Summary (Signed)
Physical Therapy Discharge Summary  Patient Details  Name: Frank Luna MRN: [id number] Date of Birth: 16-Nov-1960  Today's Date: 07/04/2020 PT Individual Time: 1100-1133 PT Individual Time Calculation (min): 33 min    Patient has met 4 of 8 long term goals due to improved activity tolerance, improved balance, improved postural control, increased strength, ability to compensate for deficits, functional use of  left upper extremity and left lower extremity, improved attention, improved awareness and improved coordination.  Patient to discharge at an ambulatory level CGA/minA.   Patient's care partner is independent to provide the necessary physical and cognitive assistance at discharge.  Reasons goals not met: Pt's discharge date moved up because pt requesting to return home from prolonged hospitalization. Supervision goals set but pt needs CGA/minA for OOB mobility due to L inattention, L hemibody weakness, decreased dynamic standing balance, decreased insight into deficits, and poor safety awareness.   Recommendation:  Patient will benefit from ongoing skilled PT services in home health setting to continue to advance safe functional mobility, address ongoing impairments in L hemibody neuromuscular re-ed, standing balance, gait impairments, home safety training, and caregiver training in order to minimize fall risk.  Equipment: w/c has been delivered to house. Family getting RW  Reasons for discharge: discharge from hospital - Not all treatment goals met due to patient's DC date being moved up.   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 Mild L inattention Restrictions Weight Bearing Restrictions: No Other Position/Activity Restrictions: Poor safety awareness and decreased insight into deficits Vision/Perception  Vision - Assessment Eye Alignment: Within  Functional Limits Ocular Range of Motion: Within Functional Limits Alignment/Gaze Preference: Within Defined Limits Tracking/Visual Pursuits: Able to track stimulus in all quads without difficulty Perception Perception: Within Functional Limits Praxis Praxis: Intact  Cognition Overall Cognitive Status: Within Functional Limits for tasks assessed Arousal/Alertness: Awake/alert Orientation Level: Oriented X4 Attention: Sustained;Selective Focused Attention: Appears intact Sustained Attention: Appears intact Selective Attention: Impaired Selective Attention Impairment: Verbal complex Memory: Impaired Memory Impairment: Decreased recall of new information Problem Solving: Impaired Problem Solving Impairment: Verbal basic;Functional basic;Functional complex Executive Function: Sequencing;Organizing Sequencing: Impaired Sequencing Impairment: Verbal complex Behaviors: Impulsive Safety/Judgment: Impaired Comments: pt requires cuing for sequencing and safety during all transfers Sensation Sensation Light Touch: Impaired by gross assessment Hot/Cold: Appears Intact Proprioception: Appears Intact Stereognosis: Not tested Additional Comments: Able to detect light touch but dimminshed sensation. LUE > LLE Coordination Gross Motor Movements are Fluid and Coordinated: No Fine Motor Movements are Fluid and Coordinated: No Coordination and Movement Description: Blocked movement patterns and effortful 2/2 L hemibody weakness (LUE>LLE). Finger Nose Finger Test: UTA on LUE due to weakness Heel Shin Test: Limited ability due to hip flexor weakness on LUE. No apparent dyscoordination with task Motor  Motor Motor: Hemiplegia Motor - Skilled Clinical Observations: L hemi - LUE>LLE  Mobility Bed Mobility Bed Mobility: Rolling Right;Rolling Left;Supine to Sit;Sit to Supine Rolling Right: Supervision/verbal cueing Rolling Left: Supervision/Verbal cueing Supine to Sit: Supervision/Verbal  cueing Sit to Supine: Supervision/Verbal cueing Transfers Transfers: Sit to Stand;Stand to Sit;Stand Pivot Transfers Sit to Stand: Supervision/Verbal cueing Stand to Sit: Supervision/Verbal cueing Stand Pivot Transfers: Supervision/Verbal cueing Stand Pivot Transfer Details: Verbal cues for precautions/safety;Verbal cues for gait pattern;Verbal cues for sequencing;Verbal cues for technique;Verbal cues for safe use of DME/AE;Visual cues/gestures for sequencing;Visual cues/gestures for precautions/safety Transfer (Assistive device): Rolling walker Locomotion  Gait Ambulation: Yes Gait Assistance: Contact Guard/Touching assist;Minimal Assistance - Patient >  75% Gait Distance (Feet): 200 Feet Assistive device: Rolling walker Gait Assistance Details: Visual cues for safe use of DME/AE;Manual facilitation for placement;Manual facilitation for weight shifting;Verbal cues for technique;Verbal cues for precautions/safety;Verbal cues for sequencing;Verbal cues for safe use of DME/AE;Verbal cues for gait pattern Gait Gait: Yes Gait Pattern: Impaired Gait Pattern: Step-to pattern;Decreased step length - right;Decreased step length - left;Decreased stance time - left;Decreased hip/knee flexion - left;Decreased dorsiflexion - left;Poor foot clearance - left;Trunk flexed Gait velocity: Decreased Stairs / Additional Locomotion Stairs: Yes Stairs Assistance: Minimal Assistance - Patient > 75% Stair Management Technique: One rail Right Number of Stairs: 12 Height of Stairs: 6 Wheelchair Mobility Wheelchair Mobility: No  Trunk/Postural Assessment  Cervical Assessment Cervical Assessment: Within Functional Limits Thoracic Assessment Thoracic Assessment: Within Functional Limits Lumbar Assessment Lumbar Assessment: Exceptions to [facility] (post pelvic tilt) Postural Control Postural Control: Within Functional Limits  Balance Balance Balance Assessed: Yes Static Sitting Balance Static Sitting - Balance  Support: Feet supported;No upper extremity supported Static Sitting - Level of Assistance: 7: Independent Dynamic Sitting Balance Dynamic Sitting - Balance Support: During functional activity;Feet supported;No upper extremity supported Dynamic Sitting - Level of Assistance: 5: Stand by assistance Sitting balance - Comments: Static sitting EOB no LOB Static Standing Balance Static Standing - Balance Support: No upper extremity supported Static Standing - Level of Assistance: 5: Stand by assistance Dynamic Standing Balance Dynamic Standing - Balance Support: During functional activity;No upper extremity supported Dynamic Standing - Level of Assistance: 4: Min assist Dynamic Standing - Balance Activities: Reaching across midline;Reaching for objects;Forward lean/weight shifting Extremity Assessment  RUE Assessment RUE Assessment: Within Functional Limits LUE Assessment LUE Assessment: Exceptions to [facility] Active Range of Motion (AROM) Comments: pt demos 3/5 strength in shoulder and elbow General Strength Comments: pt has made great progress, can move through  ROM at least one time in each direction before muscle fatigues LUE Body System: Neuro Brunstrum levels for arm and hand: Arm;Hand Brunstrum level for arm: Stage III Synergy is performed voluntarily Brunstrum level for hand: Stage II Synergy is developing LUE Tone LUE Tone: Mild RLE Assessment RLE Assessment: Within Functional Limits General Strength Comments: Grossly 5/5 LLE Assessment LLE Assessment: Exceptions to [facility] General Strength Comments: Ankle DF 4/5, knee ext 4-/5, hip flex 4-/5  Skilled Intervention: Pt greeted seated in recliner. Family (step-daughter and wife) at bedside. Pt denies pain. Focus of session to complete schedule family ed/training as pt is [place] after therapies today. Lengthy discussions with all members regarding home safety, stroke recovery, role of f/u therapies, DME rec's, calling emergency services for  falls at home, CGA assist level at DC, use of gait belt, and using RW AT ALL TIMES when ambulating. Short distance gait in room with minA and no AD with difficulty producing L weight shift and L foot clearance. W/c transport for time management to main rehab gym. Stair training with therapist with CGA and 1 hand rail x4 steps needing min cues for ascending with strong foot and descending with weak foot. Step-daughter completed a 2nd set of 4 steps with CGA and therapist providing close supervision - step-daughter did a great job of guarding and positioning during stairs. Gait x116ft with CGA/minA and RW on level ground - instructed family on guarding technique and needing CGA and NOT distant supervision. Car transfer completed with CGA and RW with cues needed for safety approach and sequencing. Educated to family on technique and they report feeling comfortable with the car. Pt transported back to room and  and ambulated short distances with minA and no AD to recliner. Remained seated in recliner with family at bedside. All questions and concerns addressed, pt reporting readiness to go home.   Meryl Ponder P Cleland Simkins PT, DPT 07/03/2020, 3:44 PM

## 2020-07-03 NOTE — Progress Notes (Signed)
Physical Therapy Session Note  Patient Details  Name: Frank Luna MRN: [id number] Date of Birth: 1960/06/08  Today's Date: 07/03/2020 PT Individual Time: 1100-1155 + 1350-1500 PT Individual Time Calculation (min): 55 min  + 70 min  Short Term Goals: Week 2:  PT Short Term Goal 1 (Week 2): STG = LTG due to ELOS  Skilled Therapeutic Interventions/Progress Updates:     1st session: Pt greeted sitting in recliner, agreeable to therapy. Pt reports he is waiting on nursing to come help with toileting. Sit<>stand with supervision to RW and ambulated with CGA (~`65ft) to the toilet in his room. Pt able to manage lower body clothes in standing with CGA for balance. CGA for sit>supine to low height toilet and pt continent of bladder. Sit<>stand with CGA and pt able to pull shorts over hips in standing with CGA. Ambulated with RW and CGA ~32ft to w/c. Increased difficulty managing tight spaces in bathroom needing cues for L foot awareness. Lab staff then arriving for routine blood draw. W/c transport for time management to main rehab gym. Stand<>pivot with CGA to mat table and no AD with cues for safety approach. With mirror for visual feedback, completed "warm up" exercises including repeated unsupported sit<>stands and alternating LAQ/hip marches. Gait training 278ft + 63ft + 8ft  with CGA fading to minA and RW - cues needed for increasing L step length and improving L foot clearance. Increased unsteadiness during turns and more cues needed for RW management and keeping LLE in walker frame. Stair training 2x12 steps (seated rest) with CGA/minA and 1 handrail on R. Needs cues for stepping up with R foot (nonparetic) and stepping down with L foot (paretic) ~25% of the time. Will need family ed on this tomorrow with wife. Returned to room in w/c for energy conservation and time management. Ambulated in room with CGA/minA and RW to recliner, needing minA for balance and RW management to navigate tight spaces b/w  bed and recliner. Chair alarm activated at end of session, needs within reach.  2nd session: Pt greeted seated in recliner, agreeable to therapy session. No reports of pain. Somewhat mildly impulsive this session - performing sit<>stands prior to being cued. 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 Sit<>stand with supervision and no AD - requires minA for  Gait 74ft with no AD to w/c. W/c transport for time management to ortho gym and completed stand<>pivot transfer with close supervision and no AD to mat table. Completed alternating step-ups on to 4inch platform requiring min/modA while unsupported. Increased difficulty maintaining wide BOS while stepping down backwards due to L adductor tone kicking in. Also performed L lateral stepping and R lateral stepping onto 4inch platform, working on LLE cordination with L stepping and LLE stance control with R stepping - needing modA for balance. Completed NMR BITS system for dual-cog task including visual pursuit with 2.03 reaction time. He also completed Geoboard with memory overlay, unable to perform mild complexity puzzle and decreased error recognition. He then performed visual presentation memory activity, able to get 4-5 word sequencing but difficulty understanding rules, maybe due to novel task. Completed Nustep at workload 5, using BLE's only, focusing on reciprocal movement patterns and LLE strengthening - pt now able to now need assist for LLE stabilization in neutral (!) as he had in the past. Completed with brief rest breaks at 91min45 seconds and maintained ~25 steps/minute cadence. Instructed on car transfer with car height set to simulate his at  home. Pt required minA for safety approach and stepping backwards to the car but was able to bring legs in/out with supervision. Practiced walking and sitting to different surfaces (chair with armrests and mat tables) to emphasize feeling back of legs with sitting  surface to avoid sitting prematurely. Pt transported back to his room in w/c for time management. Requesting assistance to bathroom. Ambulated in room with minA and RW and continent of bladder. Pt remained seated in recliner at end of session with chair alarm activated and needs in reach. Reminded pt of family education tomorrow with wife prior to DC.   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 Mild L inattention Restrictions Weight Bearing Restrictions: No General:     Therapy/Group: Individual Therapy  Orrin Brigham 07/03/2020, 7:40 AM

## 2020-07-03 NOTE — Progress Notes (Signed)
Occupational Therapy Discharge Summary  Patient Details  Name: Frank Luna MRN: [id number] Date of Birth: [birth year]     Patient has met 4 of 66 long term goals due to improved activity tolerance, improved balance, postural control, functional use of  LEFT upper extremity and improved coordination.  Patient to discharge at overall Supervision level.  Patient's care partner is independent to provide the necessary physical and cognitive assistance at discharge. Family education completed with family on day of discharge. Educated pt's need for supervision/CGA during ADLs and functional transfers, req cuing for safety. Demo'd toilet tranfers and mobility and provided with handouts on ADLs with affected LUE. Family and pt had no concerns upon OT exit, all questions answered.   Reasons goals not met: Pt continues to demo L sided weakness, L side inattention, and cuing for safety awareness during functional tasks.   Recommendation:  Patient will benefit from ongoing skilled OT services in home health setting to continue to advance functional skills in the area of BADL and iADL.  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 Mild L inattention Restrictions Weight Bearing Restrictions: No Vital Signs Therapy Vitals Temp: 99.1 F (37.3 C) Temp Source: Oral Pulse Rate: 69 Resp: 16 BP: 116/66 Patient Position (if appropriate): Sitting Oxygen Therapy SpO2: 96 % O2 Device: Room Air Pain Pain Assessment Pain Scale: 0-10 Pain Score: 0-No pain ADL ADL Eating: Independent Where Assessed-Eating: Chair Grooming: Independent Where Assessed-Grooming: Sitting at sink Upper Body Bathing: Minimal assistance Where Assessed-Upper Body Bathing: Shower Lower Body Bathing:  Supervision/safety Where Assessed-Lower Body Bathing: Shower Upper Body Dressing: Supervision/safety Where Assessed-Upper Body Dressing: Chair Lower Body Dressing: Supervision/safety Where Assessed-Lower Body Dressing: Chair Toileting: Supervision/safety Where Assessed-Toileting: Glass blower/designer: Close supervision Toilet Transfer Method: Counselling psychologist: Energy manager: Curator Method: Heritage manager: Advertising account executive Baseline Vision/History: No visual deficits Patient Visual Report: No change from baseline Vision Assessment?: Yes Eye Alignment: Within Functional Limits Ocular Range of Motion: Within Functional Limits Alignment/Gaze Preference: Within Defined Limits Tracking/Visual Pursuits: Able to track stimulus in all quads without difficulty Cognition Overall Cognitive Status: Within Functional Limits for tasks assessed Arousal/Alertness: Awake/alert Orientation Level: Oriented X4 Attention: Sustained;Selective Focused Attention: Appears intact Sustained Attention: Appears intact Selective Attention: Impaired Selective Attention Impairment: Verbal complex Memory: Impaired Memory Impairment: Decreased recall of new information Problem Solving: Impaired Problem Solving Impairment: Verbal basic;Functional basic;Functional complex Executive Function: Sequencing;Organizing Sequencing: Impaired Sequencing Impairment: Verbal complex Behaviors: Impulsive Safety/Judgment: Impaired Comments: pt requires cuing for sequencing and safety during all transfers Sensation Sensation Light Touch: Impaired by gross assessment Hot/Cold: Appears Intact Proprioception: Appears Intact Stereognosis: Not tested Additional Comments: Able to detect light touch but dimminshed sensation. LUE > LLE Coordination Gross Motor Movements are Fluid and Coordinated: No Fine Motor  Movements are Fluid and Coordinated: No Coordination and Movement Description: Blocked movement patterns and effortful 2/2 L hemibody weakness (LUE>LLE). Finger Nose Finger Test: UTA on LUE due to weakness Motor  Motor Motor: Hemiplegia Motor - Skilled Clinical Observations: L hemi - LUE>LLE Mobility  Bed Mobility Bed Mobility: Rolling Right;Rolling Left;Supine to Sit;Sit to Supine Rolling Right: Supervision/verbal cueing Rolling Left: Supervision/Verbal cueing Supine to Sit: Supervision/Verbal cueing Transfers Sit to Stand: Supervision/Verbal cueing Stand to Sit: Supervision/Verbal cueing  Trunk/Postural Assessment  Cervical Assessment Cervical Assessment:  Within Functional Limits Thoracic Assessment Thoracic Assessment: Within Functional Limits Lumbar Assessment Lumbar Assessment: Exceptions to [facility] (posterior pelvic tilt)  Balance Balance Balance Assessed: Yes Static Sitting Balance Static Sitting - Balance Support: Feet supported;No upper extremity supported Static Sitting - Level of Assistance: 5: Stand by assistance Dynamic Sitting Balance Dynamic Sitting - Balance Support: During functional activity;Feet supported;No upper extremity supported Dynamic Sitting - Level of Assistance: 5: Stand by assistance Sitting balance - Comments: Static sitting EOB no LOB Static Standing Balance Static Standing - Balance Support: No upper extremity supported Static Standing - Level of Assistance: 5: Stand by assistance Dynamic Standing Balance Dynamic Standing - Balance Support: During functional activity;No upper extremity supported Dynamic Standing - Level of Assistance: 4: Min assist Extremity/Trunk Assessment RUE Assessment RUE Assessment: Within Functional Limits LUE Assessment LUE Assessment: Exceptions to [facility] Active Range of Motion (AROM) Comments: pt demos 3/5 strength in shoulder and elbow General Strength Comments: pt has made great progress, can move through  ROM at  least one time in each direction before muscle fatigues LUE Body System: Neuro Brunstrum levels for arm and hand: Arm;Hand Brunstrum level for arm: Stage III Synergy is performed voluntarily Brunstrum level for hand: Stage II Synergy is developing LUE Tone LUE Tone: Mild   Frank Luna 07/03/2020, 1:00 PM

## 2020-07-03 NOTE — Progress Notes (Signed)
[place] PHYSICAL MEDICINE & REHABILITATION PROGRESS NOTE  Subjective/Complaints: Patient seen working with therapies this AM.  He states he slept well overnight.  He states he had a good weekend.  He states he is going home tomorrow.  Encouraged discussion with wife to determine her level of comfort.   ROS: Denies CP, SOB, N/V/D  Objective: Vital Signs: Blood pressure 116/66, pulse 69, temperature 99.1 F (37.3 C), temperature source Oral, resp. rate 16, height 5' 7.5" (1.715 m), weight 93.4 kg, SpO2 96 %. No results found. Recent Labs    07/03/20 1116  WBC 7.3  HGB 17.3*  HCT 51.3  PLT 116*   No results for input(s): NA, K, CL, CO2, GLUCOSE, BUN, CREATININE, CALCIUM in the last 72 hours.  Intake/Output Summary (Last 24 hours) at 07/03/2020 1408 Last data filed at 07/03/2020 1254 Gross per 24 hour  Intake 740 ml  Output 350 ml  Net 390 ml       Physical Exam: BP 116/66 (BP Location: Right Arm)   Pulse 69   Temp 99.1 F (37.3 C) (Oral)   Resp 16   Ht 5' 7.5" (1.715 m)   Wt 93.4 kg   SpO2 96%   BMI 31.76 kg/m  Constitutional: No distress . Vital signs reviewed. HENT: Normocephalic.  Atraumatic. Eyes: EOMI. No discharge. Cardiovascular: No JVD.  RRR. Respiratory: Normal effort.  No stridor.  Bilateral clear to auscultation. GI: Non-distended.  BS +. Skin: Warm and dry.  Intact. Psych: Normal mood.  Normal behavior. Musc: No edema in extremities.  No tenderness in extremities. Neuro: Alert Motor: RUE/RLE: 5/5 proximal distal LLE: 5/5 proximal distal, stable LUE: Shoulder abduction 2/5, elbow flexion/extension 2/5, distally 2-/5 with apraxia, appears unchanged  Assessment/Plan: 1. Functional deficits which require 3+ hours per day of interdisciplinary therapy in a comprehensive inpatient rehab setting.  Physiatrist is providing close team supervision and 24 hour management of active medical problems listed below.  Physiatrist and rehab team continue to assess  barriers to discharge/monitor patient progress toward functional and medical goals   Care Tool:  Bathing    Body parts bathed by patient: Chest,Abdomen,Left arm,Front perineal area,Right upper leg,Left upper leg,Face,Buttocks,Right lower leg,Left lower leg   Body parts bathed by helper: Right arm     Bathing assist Assist Level: Minimal Assistance - Patient > 75%     Upper Body Dressing/Undressing Upper body dressing   What is the patient wearing?: Pull over shirt    Upper body assist Assist Level: Contact Guard/Touching assist    Lower Body Dressing/Undressing Lower body dressing      What is the patient wearing?: Underwear/pull up,Pants     Lower body assist Assist for lower body dressing: Contact Guard/Touching assist     Toileting Toileting    Toileting assist Assist for toileting: Supervision/Verbal cueing     Transfers Chair/bed transfer  Transfers assist     Chair/bed transfer assist level: Contact Guard/Touching assist     Locomotion Ambulation   Ambulation assist      Assist level: Minimal Assistance - Patient > 75% Assistive device: Walker-rolling Max distance: 164ft   Walk 10 feet activity   Assist     Assist level: Minimal Assistance - Patient > 75% Assistive device: Walker-rolling   Walk 50 feet activity   Assist Walk 50 feet with 2 turns activity did not occur: Safety/medical concerns  Assist level: Minimal Assistance - Patient > 75% Assistive device: Walker-rolling    Walk 150 feet activity  Assist Walk 150 feet activity did not occur: Safety/medical concerns  Assist level: Minimal Assistance - Patient > 75% Assistive device: Walker-rolling    Walk 10 feet on uneven surface  activity   Assist Walk 10 feet on uneven surfaces activity did not occur: Safety/medical concerns         Wheelchair     Assist Will patient use wheelchair at discharge?: No   Wheelchair activity did not occur: N/A          Wheelchair 50 feet with 2 turns activity    Assist    Wheelchair 50 feet with 2 turns activity did not occur: N/A       Wheelchair 150 feet activity     Assist  Wheelchair 150 feet activity did not occur: N/A        Medical Problem List and Plan: 1.  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  Impaired mobility -DVT/anticoagulation: Continue Lovenox             -antiplatelet therapy: Aspirin 81 mg daily and Plavix 75 mg daily for 3 weeks total then aspirin alone 3. Pain Management: Tylenol as needed 4. Mood: Provide emotional support             -antipsychotic agents: Seroquel started on 5/10 ?with some improvement 5. Neuropsych: This patient is? capable of making decisions on his own behalf. 6. Skin/Wound Care: Routine skin checks 7. Fluids/Electrolytes/Nutrition: Routine in and outs.               CMP ordered for tomorrow  5/8- will check BMP tomorrow AM 8.  Hypertension.  Continue Lisinopril 40 mg daily, Toprol-XL 75 mg daily.    Controlled on 5/16             Monitor with increased mobility 9.  Hyperlipidemia: Lipitor 10. Uncontrolled diabetes mellitus with hyperglycemia.  Hemoglobin A1c 8.3.  SSI.  Presently on Lantus insulin 12 units nightly.  Patient also on farxiga 10 mg daily prior to admission.    Glucotrol  5 daily (10 mg twice daily PTA) started on 5/10, increase to BID on 5/16          CBG (last 3)  Recent Labs    07/02/20 2058 07/03/20 0607 07/03/20 1156  GLUCAP 168* 158* 163*    11.  History of tobacco use.  Provide counseling 12.  Thrombocytopenia               Platelets 116 on 5/16  Continue to monitor 13.  Hypoalbuminemia  Supplement initiated on 5/5 14. Poor safety awareness with noncompliance  LOS: 12 days A FACE TO FACE EVALUATION WAS PERFORMED  [name] [name] 07/03/2020, 2:08 PM

## 2020-07-03 NOTE — Discharge Summary (Addendum)
Physician Discharge Summary  Patient ID: Frank Luna MRN: [id number] DOB/AGE: 1961/01/10 60 y.o.  Admit date: 06/21/2020 Discharge date: 07/04/2020  Discharge Diagnoses:  Principal Problem:   Acute cerebral infarction [facility]) Active Problems:   Small vessel disease, cerebrovascular   Hypoalbuminemia due to protein-calorie malnutrition (HCC)   Uncontrolled type 2 diabetes mellitus with hyperglycemia (HCC)   Monoparesis of arm (HCC)   Noncompliance   Benign essential HTN DVT prophylaxis Hyperlipidemia History of tobacco use Thrombocytopenia  Discharged Condition: Stable  Significant Diagnostic Studies: DG Elbow 2 Views Left  Result Date: 06/17/2020 CLINICAL DATA:  Fall, elbow stiffness. EXAM: LEFT ELBOW - 2 VIEW COMPARISON:  None. FINDINGS: There is no evidence of fracture, dislocation, or joint effusion. There is no evidence of significant arthropathy or other focal bone abnormality. Soft tissues are unremarkable. IMPRESSION: Negative. Electronically Signed   By: Bary Richard M.D.   On: 06/17/2020 19:52   CT HEAD WO CONTRAST  Result Date: 06/17/2020 CLINICAL DATA:  Follow-up stroke.  Fall from bed with head trauma. EXAM: CT HEAD WITHOUT CONTRAST TECHNIQUE: Contiguous axial images were obtained from the base of the skull through the vertex without intravenous contrast. COMPARISON:  Head CT dated 06/17/2020.  Brain MRI dated 06/17/2020. FINDINGS: Brain: Generalized parenchymal volume loss with commensurate dilatation of the ventricles and sulci. No evidence of hydrocephalus. Chronic small vessel ischemic changes are seen within the bilateral periventricular and subcortical white matter regions. Small old lacunar infarcts are seen within the bilateral basal ganglia regions. No mass, hemorrhage, edema or other evidence of acute parenchymal abnormality. No extra-axial hemorrhage. Vascular: Chronic calcified atherosclerotic changes of the large vessels at the skull base. No unexpected hyperdense  vessel. Skull: Normal. Negative for fracture or focal lesion. Sinuses/Orbits: No acute finding. Other: None. IMPRESSION: 1. No acute findings. No intracranial mass, hemorrhage or edema. No skull fracture. 2. Chronic small vessel ischemic changes in the white matter and basal ganglia regions. Electronically Signed   By: Bary Richard M.D.   On: 06/17/2020 19:56   CT HEAD WO CONTRAST  Result Date: 06/17/2020 CLINICAL DATA:  Left-sided arm and leg weakness. EXAM: CT HEAD WITHOUT CONTRAST TECHNIQUE: Contiguous axial images were obtained from the base of the skull through the vertex without intravenous contrast. COMPARISON:  February 04, 2016 FINDINGS: Brain: There is mild cerebral atrophy with widening of the extra-axial spaces and ventricular dilatation. There are areas of decreased attenuation within the white matter tracts of the supratentorial brain, consistent with microvascular disease changes. A chronic left basal ganglia lacunar infarct is seen. Vascular: No hyperdense vessel or unexpected calcification. Skull: Normal. Negative for fracture or focal lesion. Sinuses/Orbits: There is mild right maxillary sinus mucosal thickening. Other: None. IMPRESSION: 1. Generalized cerebral atrophy. 2. No acute intracranial abnormality. Electronically Signed   By: Aram Candela M.D.   On: 06/17/2020 04:29   MR BRAIN WO CONTRAST  Result Date: 06/25/2020 CLINICAL DATA:  Recent right-sided white matter infarction with left facial numbness. EXAM: MRI HEAD WITHOUT CONTRAST TECHNIQUE: Multiplanar, multiecho pulse sequences of the brain and surrounding structures were obtained without intravenous contrast. COMPARISON:  CT and MRI 06/17/2020. FINDINGS: Brain: Diffusion imaging shows a new punctate acute infarction in the white matter of the left external capsule. Second new acute punctate infarction in the left frontal subcortical white matter. Previously seen acute infarction within the right corona radiata is again  demonstrated without evidence of extension. No large vessel territory acute infarction. Extensive chronic small-vessel ischemic changes elsewhere throughout the pons, thalami,  basal ganglia and hemispheric white matter are stable. Generalized brain atrophy as seen previously. No hydrocephalus, acute hemorrhage or extra-axial collection. Vascular: Major vessels at the base of the brain show flow. Skull and upper cervical spine: Negative Sinuses/Orbits: Clear/normal Other: Bilateral mastoid effusions as seen previously. IMPRESSION: Two new punctate white matter infarctions in the left hemisphere, 1 within the external capsule in the other within the left frontal subcortical white matter towards the vertex. No mass effect or acute hemorrhage. No evidence of extension of the previously seen acute infarction in the right corona radiata. Extensive widespread old small vessel ischemic changes throughout the brain as seen previously. Redemonstration of mastoid effusions. Electronically Signed   By: Paulina Fusi M.D.   On: 06/25/2020 15:12   MR BRAIN WO CONTRAST  Result Date: 06/17/2020 CLINICAL DATA:  Left-sided arm and leg weakness. EXAM: MRI HEAD WITHOUT CONTRAST TECHNIQUE: Multiplanar, multiecho pulse sequences of the brain and surrounding structures were obtained without intravenous contrast. COMPARISON:  Negative acute CT evaluations for 202021 and earlier today. FINDINGS: Brain: Study suffers from considerable motion degradation. There is a 1 cm acute infarction within the white matter of the corona radiography on the right. No other acute infarction. Extensive chronic small-vessel ischemic changes affect the pons. No focal cerebellar infarction. Old small vessel infarctions are present affecting the thalami, basal ganglia and hemispheric white matter. No large vessel territory infarction. No mass, hemorrhage, hydrocephalus or extra-axial collection. Vascular: Major vessels at the base of the brain show flow.  Skull and upper cervical spine: Negative Sinuses/Orbits: Clear/normal Other: Bilateral mastoid effusions. IMPRESSION: 1 cm acute infarction in the corona radiata white matter on the right. Extensive chronic small-vessel ischemic changes elsewhere throughout brain as outlined above. Bilateral mastoid effusions. Electronically Signed   By: Paulina Fusi M.D.   On: 06/17/2020 16:12   ECHOCARDIOGRAM COMPLETE BUBBLE STUDY  Result Date: 06/18/2020    ECHOCARDIOGRAM REPORT   Patient Name:   Frank Luna Date of Exam: 06/18/2020 Medical Rec #:  [id number]    Height:       69.0 in Accession #:    [id number]   Weight:       189.6 lb Date of Birth:  1960/02/21    BSA:          2.020 m Patient Age:    59 years     BP:           167/79 mmHg Patient Gender: M            HR:           77 bpm. Exam Location:  Inpatient Procedure: 2D Echo, Cardiac Doppler, Color Doppler and Saline Contrast Bubble            Study Indications:    CVA  History:        Patient has prior history of Echocardiogram examinations, most                 recent 02/05/2016. Stroke; Risk Factors:Hypertension, Diabetes,                 Current Smoker and Obesity.  Sonographer:    Lavenia Atlas Referring Phys: [id number] [facility]  Sonographer Comments: Patient is morbidly obese and suboptimal apical window. Image acquisition challenging due to patient body habitus. IMPRESSIONS  1. Left ventricular ejection fraction, by estimation, is 60 to 65%. The left ventricle has normal function. The left ventricle has no regional wall motion abnormalities. There is mild concentric left  ventricular hypertrophy. Left ventricular diastolic parameters were normal.  2. Right ventricular systolic function is normal. The right ventricular size is normal. Tricuspid regurgitation signal is inadequate for assessing PA pressure.  3. The mitral valve is normal in structure. No evidence of mitral valve regurgitation. No evidence of mitral stenosis.  4. The aortic valve is  tricuspid. Aortic valve regurgitation is not visualized. Mild aortic valve sclerosis is present, with no evidence of aortic valve stenosis.  5. The inferior vena cava is normal in size with <50% respiratory variability, suggesting right atrial pressure of 8 mmHg.  6. Agitated saline contrast bubble study was negative, with no evidence of any interatrial shunt. FINDINGS  Left Ventricle: Left ventricular ejection fraction, by estimation, is 60 to 65%. The left ventricle has normal function. The left ventricle has no regional wall motion abnormalities. The left ventricular internal cavity size was normal in size. There is  mild concentric left ventricular hypertrophy. Left ventricular diastolic parameters were normal. Normal left ventricular filling pressure. Right Ventricle: The right ventricular size is normal. No increase in right ventricular wall thickness. Right ventricular systolic function is normal. Tricuspid regurgitation signal is inadequate for assessing PA pressure. Left Atrium: Left atrial size was normal in size. Right Atrium: Right atrial size was normal in size. Pericardium: There is no evidence of pericardial effusion. Mitral Valve: The mitral valve is normal in structure. No evidence of mitral valve regurgitation. No evidence of mitral valve stenosis. Tricuspid Valve: The tricuspid valve is normal in structure. Tricuspid valve regurgitation is not demonstrated. No evidence of tricuspid stenosis. Aortic Valve: The aortic valve is tricuspid. Aortic valve regurgitation is not visualized. Mild aortic valve sclerosis is present, with no evidence of aortic valve stenosis. Pulmonic Valve: The pulmonic valve was normal in structure. Pulmonic valve regurgitation is not visualized. No evidence of pulmonic stenosis. Aorta: The aortic root is normal in size and structure. Venous: The inferior vena cava is normal in size with less than 50% respiratory variability, suggesting right atrial pressure of 8 mmHg.  IAS/Shunts: No atrial level shunt detected by color flow Doppler. Agitated saline contrast was given intravenously to evaluate for intracardiac shunting. Agitated saline contrast bubble study was negative, with no evidence of any interatrial shunt.  LEFT VENTRICLE PLAX 2D LVIDd:         4.90 cm  Diastology LVIDs:         3.00 cm  LV e' medial:    7.62 cm/s LV PW:         1.40 cm  LV E/e' medial:  9.6 LV IVS:        1.40 cm  LV e' lateral:   8.59 cm/s LVOT diam:     2.10 cm  LV E/e' lateral: 8.5 LV SV:         69 LV SV Index:   34 LVOT Area:     3.46 cm  RIGHT VENTRICLE RV Basal diam:  3.50 cm RV S prime:     12.30 cm/s TAPSE (M-mode): 2.4 cm LEFT ATRIUM             Index       RIGHT ATRIUM           Index LA diam:        4.00 cm 1.98 cm/m  RA Area:     12.80 cm LA Vol (A2C):   48.6 ml 24.06 ml/m RA Volume:   28.70 ml  14.21 ml/m LA Vol (A4C):   34.8 ml  17.23 ml/m LA Biplane Vol: 41.4 ml 20.50 ml/m  AORTIC VALVE LVOT Vmax:   108.00 cm/s LVOT Vmean:  70.200 cm/s LVOT VTI:    0.199 m  AORTA Ao Root diam: 3.30 cm MITRAL VALVE MV Area (PHT): 3.99 cm    SHUNTS MV Decel Time: 190 msec    Systemic VTI:  0.20 m MV E velocity: 73.30 cm/s  Systemic Diam: 2.10 cm MV A velocity: 61.70 cm/s MV E/A ratio:  1.19 Armanda Magic MD Electronically signed by Armanda Magic MD Signature Date/Time: 06/18/2020/1:05:44 PM    Final    CT ANGIO HEAD NECK W WO CM W PERF  Result Date: 06/17/2020 CLINICAL DATA:  60 year old male with left side numbness and weakness upon waking. 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 EXAM: CT ANGIOGRAPHY HEAD AND NECK TECHNIQUE: Multidetector CT imaging of the head and neck was performed using the standard protocol during bolus administration of intravenous contrast. Multiplanar CT image reconstructions and MIPs were obtained to evaluate the vascular anatomy. Carotid stenosis measurements (when applicable) are obtained utilizing NASCET criteria,  using the distal internal carotid diameter as the denominator. CONTRAST:  OMNIPAQUE IOHEXOL 350 MG/ML SOLN COMPARISON:  CTA head and neck and CT brain perfusion 06/08/2019. CTA head and neck 02/04/2016. FINDINGS: CTA NECK Skeleton: Carious dentition. Progressive left middle ear opacification since last year. Chronic mastoid effusions greater on the left. No acute osseous abnormality identified. Upper chest: Stable upper lungs, mild pulmonary scarring. No superior mediastinal lymphadenopathy. Other neck: Partially effaced pharynx similar to last year. Chronic adenoid soft tissue enlargement has not significantly changed. No discrete nasopharynx mass or hyperenhancement. Negative parapharyngeal and retropharyngeal spaces. No cervical lymphadenopathy identified. Aortic arch: Calcified aortic atherosclerosis. 3 vessel arch configuration. Right carotid system: Mild right CCA plaque without stenosis. Mild to moderate soft and calcified plaque at the right carotid bifurcation through ICA bulb without stenosis. Left carotid system: Minimal left CCA soft plaque without stenosis. Mild mostly calcified plaque at the left carotid bifurcation and ICA origin without stenosis. Vertebral arteries: Proximal subclavian arteries are patent with mild plaque and no significant stenosis. Both vertebral arteries are diminutive, more so the right. There is chronic atherosclerosis at the right vertebral artery origin with at least moderate stenosis there, stable. Left vertebral artery origin is within normal limits. Both diminutive vertebral arteries remain patent to the skull base without additional stenosis. CTA HEAD Posterior circulation: Diminutive right vertebral artery terminates in PICA. Left PICA origin is patent. Diminutive left V4 segment continues to the vertebrobasilar junction. 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 This appearance unchanged since 2017 and likely congenital. Both posterior communicating arteries are present, the left is larger. There is mild bilateral P1 irregularity and stenosis. PCA branches are within normal limits. Anterior circulation: Both ICA siphons are patent. On the left mild to moderate calcified atherosclerosis results in mild supraclinoid stenosis. On the right similar mild to moderate calcified plaque and mild supraclinoid stenosis. Right side persistent trigeminal artery also with mild calcified plaque. Normal ophthalmic and posterior communicating artery origins. Patent carotid termini. Patent MCA and ACA origins. Mild new left ACA A1 segment stenosis on series 14, image 22. Diminutive anterior communicating artery. Mildly fenestrated proximal left A2 (normal variant). Bilateral ACA branches are stable and within normal limits. Left MCA M1 segment and bifurcation  are patent without stenosis. Right MCA M1 segment and bifurcation are patent without stenosis. Bilateral MCA branches are stable and within normal limits. Venous sinuses: Early contrast timing.  Grossly patent. 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 Right vertebral artery terminates in PICA. Review of the MIP images confirms the above findings IMPRESSION: 1. Negative for emergent large vessel occlusion. 2. 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 3. Mild posterior circulation atherosclerosis otherwise, mild bilateral P1 stenoses. 4. Bilateral carotid atherosclerosis but no hemodynamically significant stenosis (mild supraclinoid ICA stenosis). 5. Progressed left middle ear opacification since last year with chronic left mastoid effusion. There is chronic nasopharynx soft tissue/adenoid hypertrophy which appears  stable since 2017. No discrete nasopharyngeal mass. Recommend ENT follow-up for left eustachian tube dysfunction. 6.  Aortic Atherosclerosis (ICD10-I70.0). Electronically Signed   By: Odessa FlemingH  Hall M.D.   On: 06/17/2020 05:59    Labs:  Basic Metabolic Panel: No results for input(s): NA, K, CL, CO2, GLUCOSE, BUN, CREATININE, CALCIUM, MG, PHOS in the last 168 hours.  CBC: Recent Labs  Lab 07/03/20 1116  WBC 7.3  NEUTROABS 4.8  HGB 17.3*  HCT 51.3  MCV 88.3  PLT 116*    CBG: Recent Labs  Lab 07/02/20 2058 07/03/20 0607 07/03/20 1156 07/03/20 1614 07/03/20 2101  GLUCAP 168* 158* 163* 133* 152*   Family history.  Maternal grandfather with CVA.  Denies any colon cancer esophageal cancer or rectal cancer  Brief HPI:   Frank Luna is a 60 y.o. right-handed male with history of diabetes mellitus, hypertension, tobacco use, thrombocytopenia, prior CVA 2017 with minimal residual weakness maintained on aspirin and Plavix.  Per chart review lives with spouse independent prior to admission.  Presented 06/17/2020 with acute onset of left-sided weakness and dysarthria.  Initially presented to [facility] after CT unremarkable for acute intracranial process and patient left AMA returning with persistent symptoms.  MRI showed a 1 cm acute infarction of the right corona radiata white matter.  Extensive chronic small vessel ischemic changes elsewhere throughout the brain as well as incidental findings of bilateral mastoid effusions.  CT angiogram of head and neck showed no emergent large vessel occlusion.  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  Admission chemistries unremarkable except glucose 126 alcohol negative hemoglobin A1c 8.3 urine drug screen negative.  Echocardiogram with ejection fraction of 60 to 65% no wall motion abnormalities.  Maintained on aspirin and Plavix for CVA prophylaxis x3 weeks and aspirin alone.   Subcutaneous Lovenox for DVT prophylaxis.  Due to patient's left side weakness slurred speech she was admitted for a comprehensive rehab program.   Hospital Course: Frank Luna was admitted to rehab 06/21/2020 for inpatient therapies to consist of PT, ST and OT at least three hours five days a week. Past admission physiatrist, therapy team and rehab RN have worked together to provide customized collaborative inpatient rehab.  Pertaining to patient's right corona radiata infarction secondary to small vessel disease as well as prior CVA 2017 with minimal residual weakness he continued on aspirin and Plavix x3 weeks total and aspirin alone.  He would follow-up with neurology services.  Subcutaneous Lovenox for DVT prophylaxis no bleeding episodes.  Blood pressure controlled on lisinopril/Norvasc as well as Toprol he would need outpatient follow-up.  Lipitor ongoing for hyperlipidemia.  Uncontrolled diabetes mellitus  hemoglobin A1c 8.3 presently on insulin as well as Glucotrol and farxiga with full diabetic teaching completed .  Patient did have some trouble sleeping irritable at times ongoing request for discharge to home he was placed on low-dose Seroquel with good results.  History of tobacco use provided with counseling cessation of nicotine products as well as placed on NicoDerm patch.  History of thrombocytopenia latest platelets 118,000 and no bleeding episodes.   Blood pressures were monitored on TID basis and soft and monitored  Diabetes has been monitored with ac/hs CBG checks and SSI was use prn for tighter BS control.    Rehab course: During patient's stay in rehab weekly team conferences were held to monitor patient's progress, set goals and discuss barriers to discharge. At admission, patient required moderate assist 15 feet to person hand-held assist moderate assist sit to stand minimal assist supine to sit moderate assist upper body bathing mod assist lower body bathing moderate assist upper  body dressing max assist lower body dressing  Physical exam.  Blood pressure 170/87 pulse 60 temperature 98 respirations 18 oxygen saturation 90% room air Constitutional.  No acute distress HEENT Head.  Normocephalic and atraumatic Eyes.  Pupils round and reactive to light no discharge.nystagmus Neck.  Supple nontender no JVD without thyromegaly Cardiac regular rate rhythm not extra sounds or murmur heard Abdomen.  Soft nontender positive bowel sounds without rebound Respiratory effort normal no respiratory distress without wheeze Skin.  Warm and dry Neurologic.  Alert oriented makes eye contact with examiner follows commands.  Fair but limited awareness of deficits Motor.  Right upper right lower extremity 5/5 proximal distal Left lower extremity 5/5 proximal distal Left upper extremity 0/5 proximal distal  He/She  has had improvement in activity tolerance, balance, postural control as well as ability to compensate for deficits. He/She has had improvement in functional use RUE/LUE  and RLE/LLE as well as improvement in awareness.  Transferred stand pivot to mat contact-guard assist.  Ambulates 200 feet mostly contact-guard assist with rolling walker.  Needs some cues for safety.  Stand pivot transfers minimal assist with no assistive device.  Sessions focused on ADLs including toileting bathing dressing oral care and grooming.  Patient walked from recliner to bathroom with rolling walker and hemileft hand grip contact-guard assist.  Required cues for safety during all transfers.  Required minimal assist to thoroughly wash right upper extremity and shower.  Contact-guard assist for pullover shirt with cueing on proper Hemi techniques and contact-guard assist for lower body bathing and dressing.  It was stressed to family need for ongoing cues for safety and physical assistance.  Full family teaching completed plan discharge to home       Disposition: Discharged to home   Diet: Diabetic  diet  Special Instructions: No driving smoking or alcohol  Medications at discharge. 1.  Tylenol as needed 2.  Norvasc 5 mg p.o. daily 3.  Lipitor 40 mg p.o. daily 4.  Vitamin D 1000 units p.o. daily 5.Farxiga 10 mg p.o. daily 6.  Glucotrol 5 mg p.o.twice daily 7.  Lantus insulin 12 units nightly 8.  Lisinopril 40 mg p.o. daily 9.  Toprol-XL 75 mg p.o. daily 10.  NicoDerm patch taper as directed 11.  Seroquel 12.5 mg p.o. nightly 12.  Aspirin 81 mg daily 13.  Plavix 75 mg daily until 07/07/2020 and stop  30-35 minutes were spent completing discharge summary and discharge planning  Discharge Instructions     Ambulatory referral to Neurology   Complete  by: As directed    An appointment is requested in approximately 4 weeks right corona radiata infarction   Ambulatory referral to Physical Medicine Rehab   Complete by: As directed    Medical complexity follow-up 1 to 2 weeks right corona radiata infarction        Follow-up Information     Marcello Fennel, MD Follow up.   Specialty: Physical Medicine and Rehabilitation Why: Office to call for appointment Contact information: [address] [place] [address] [place] [address] [phone]                 Signed: Charlton Amor 07/04/2020, 5:00 AM Patient was seen, face-face, and physical exam performed by me on day of discharge, greater than 30 minutes of total time spent.. Please see progress note from day of discharge as well.  Maryla Morrow, MD, ABPMR

## 2020-07-03 NOTE — Discharge Instructions (Signed)
Inpatient Rehab Discharge Instructions  Frank Luna Discharge date and time: No discharge date for patient encounter.   Activities/Precautions/ Functional Status: Activity: activity as tolerated Diet: diabetic diet Wound Care: Routine skin checks Functional status:  ___ No restrictions     ___ Walk up steps independently ___ 24/7 supervision/assistance   ___ Walk up steps with assistance ___ Intermittent supervision/assistance  ___ Bathe/dress independently ___ Walk with walker     __x_ Bathe/dress with assistance ___ Walk Independently    ___ Shower independently ___ Walk with assistance    ___ Shower with assistance ___ No alcohol     ___ Return to work/school ________  Special Instructions: No driving smoking or alcohol  Continue aspirin 81 mg daily and Plavix 75 mg daily x3 weeks total    COMMUNITY REFERRALS UPON DISCHARGE:   HOME EXERCISE PROGRAM DUE TO NO HOME HEALTH AGENCY WILL ACCEPT MEDICAID-WELL CARE INSURANCE  Medical Equipment/Items Ordered:WHEELCHAIR                                                 Agency/Supplier:[facility]  [phone]   STROKE/TIA DISCHARGE INSTRUCTIONS SMOKING Cigarette smoking nearly doubles your risk of having a stroke & is the single most alterable risk factor  If you smoke or have smoked in the last 12 months, you are advised to quit smoking for your health.  Most of the excess cardiovascular risk related to smoking disappears within a year of stopping.  Ask you doctor about anti-smoking medications  [place] Quit Line: 1-800-QUIT NOW  Free Smoking Cessation Classes [phone]  CHOLESTEROL Know your levels; limit fat & cholesterol in your diet  Lipid Panel     Component Value Date/Time   CHOL 94 06/17/2020 0216   TRIG 115 06/17/2020 0216   HDL 34 (L) 06/17/2020 0216   CHOLHDL 2.8 06/17/2020 0216   VLDL 23 06/17/2020 0216   LDLCALC 37 06/17/2020 0216      Many patients benefit from treatment even if their cholesterol is at  goal.  Goal: Total Cholesterol (CHOL) less than 160  Goal:  Triglycerides (TRIG) less than 150  Goal:  HDL greater than 40  Goal:  LDL (LDLCALC) less than 100   BLOOD PRESSURE American Stroke Association blood pressure target is less that 120/80 mm/Hg  Your discharge blood pressure is:     Monitor your blood pressure  Limit your salt and alcohol intake  Many individuals will require more than one medication for high blood pressure  DIABETES (A1c is a blood sugar average for last 3 months) Goal HGBA1c is under 7% (HBGA1c is blood sugar average for last 3 months)  Diabetes:   Lab Results  Component Value Date   HGBA1C 8.3 (H) 06/17/2020     Your HGBA1c can be lowered with medications, healthy diet, and exercise.  Check your blood sugar as directed by your physician  Call your physician if you experience unexplained or low blood sugars.  PHYSICAL ACTIVITY/REHABILITATION Goal is 30 minutes at least 4 days per week  Activity: Increase activity slowly, Therapies: Physical Therapy: Home Health Return to work:   Activity decreases your risk of heart attack and stroke and makes your heart stronger.  It helps control your weight and blood pressure; helps you relax and can improve your mood.  Participate in a regular exercise program.  Talk with your doctor  about the best form of exercise for you (dancing, walking, swimming, cycling).  DIET/WEIGHT Goal is to maintain a healthy weight  Your discharge diet is:  Diet Order            Diet Carb Modified Fluid consistency: Thin; Room service appropriate? Yes with Assist  Diet effective now                 liquids Your height is:    Your current weight is:   Your Body Mass Index (BMI) is:     Following the type of diet specifically designed for you will help prevent another stroke.  Your goal weight range is:    Your goal Body Mass Index (BMI) is 19-24.  Healthy food habits can help reduce 3 risk factors for stroke:  High  cholesterol, hypertension, and excess weight.  RESOURCES Stroke/Support Group:  Call [phone]   STROKE EDUCATION PROVIDED/REVIEWED AND GIVEN TO PATIENT Stroke warning signs and symptoms How to activate emergency medical system (call 911). Medications prescribed at discharge. Need for follow-up after discharge. Personal risk factors for stroke. Pneumonia vaccine given:  Flu vaccine given:  My questions have been answered, the writing is legible, and I understand these instructions.  I will adhere to these goals & educational materials that have been provided to me after my discharge from the hospital.    and aspirin alone   My questions have been answered and I understand these instructions. I will adhere to these goals and the provided educational materials after my discharge from the hospital.  Patient/Caregiver Signature _______________________________ Date __________  Clinician Signature _______________________________________ Date __________  Please bring this form and your medication list with you to all your follow-up doctor's appointments.

## 2020-07-03 NOTE — Progress Notes (Signed)
Occupational Therapy Session Note  Patient Details  Name: Frank Luna MRN: [id number] Date of Birth: 11/28/1960  Today's Date: 07/03/2020 OT Individual Time: 0800-0901 OT Individual Time Calculation (min): 61 min    Short Term Goals: Week 1:  OT Short Term Goal 1 (Week 1): Pt will don a tshirt with min A. OT Short Term Goal 2 (Week 1): Pt will don pants over feet min A. OT Short Term Goal 3 (Week 1): Pt will pull pants over hips with CGA for balance. OT Short Term Goal 4 (Week 1): Pt will use a long handled sponge as needed for washing feet and R arm. OT Short Term Goal 5 (Week 1): Pt will be able to use L hand as a stabilizing A with dressing tasks.  Skilled Therapeutic Interventions/Progress Updates:    Pt received in recliner and consented to OT tx. Session focused on ADLs including toileting, bathing, dressing, and oral care and grooming. Pt walked from recliner to bathroom with RW and hemi L hand grip with CGA-close SUP. Pt required cuing for safety during all transfers as pt does not turn all the way around before trying to sit down. Pt req min A to thoroughly wash RUE in shower, CGA for pullover shirt with cuing on proper hemi techniques, and CGA for LB dressing. Pt able to manage footwear with setup with socks placed on sock aide. Instructed in blue hand gripper exercises for 4x10 to increase intrinsic hand strength for ADLs. Pt continues to demo decreased safety awareness, decreased attention to L side of body. Pt demo's much more ROM and strength in LUE, AROM WFL in shoulder and elbow for only a few reps before fatiguing. Instructed in shoulder flexion for 1x5, and elbow flexion for 2x5. After tx, pt left up in recliner with all needs met.   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 Mild L inattention Restrictions Weight Bearing Restrictions: No    Vital Signs: Therapy Vitals Temp: 97.7 F (36.5 C) Temp Source:  Oral Pulse Rate: 61 Resp: 18 BP: 130/75 Patient Position (if appropriate): Lying Oxygen Therapy SpO2: 98 % O2 Device: Room Air Pain:   none   Therapy/Group: Individual Therapy  Kynnedy Carreno 07/03/2020, 8:07 AM

## 2020-07-03 NOTE — Progress Notes (Addendum)
Patient ID: Frank Luna, male   DOB: August 03, 1960, 60 y.o.   MRN: [id number] Spoke with wife who reports is willing to take home tomorrow after family education. Have messaged MD to see if ok with., Dan-PA aware also. Await response from MD  12:40 PM MD in agreement with discharge tomorrow after family education.

## 2020-07-04 DIAGNOSIS — R7309 Other abnormal glucose: Secondary | ICD-10-CM

## 2020-07-04 LAB — GLUCOSE, CAPILLARY
Glucose-Capillary: 108 mg/dL — ABNORMAL HIGH (ref 70–99)
Glucose-Capillary: 223 mg/dL — ABNORMAL HIGH (ref 70–99)

## 2020-07-04 MED ORDER — GLIPIZIDE 5 MG PO TABS: 5.0000 mg | ORAL_TABLET | Freq: Two times a day (BID) | ORAL | 0 refills | Status: DC

## 2020-07-04 MED ORDER — CLOPIDOGREL BISULFATE 75 MG PO TABS: 75.0000 mg | ORAL_TABLET | Freq: Every day | ORAL | 0 refills | Status: AC

## 2020-07-04 NOTE — Progress Notes (Signed)
PHYSICAL MEDICINE & REHABILITATION PROGRESS NOTE  Subjective/Complaints: Patient seen sitting up in his chair this morning.  He states he slept well overnight.  He states he is ready for discharge.  He has questions regarding discharge medications.  ROS: Denies CP, SOB, N/V/D  Objective: Vital Signs: Blood pressure 138/66, pulse 60, temperature 97.9 F (36.6 C), temperature source Oral, resp. rate 18, height 5' 7.5" (1.715 m), weight 93.4 kg, SpO2 98 %. No results found. Recent Labs    07/03/20 1116  WBC 7.3  HGB 17.3*  HCT 51.3  PLT 116*   No results for input(s): NA, K, CL, CO2, GLUCOSE, BUN, CREATININE, CALCIUM in the last 72 hours.  Intake/Output Summary (Last 24 hours) at 07/04/2020 1219 Last data filed at 07/04/2020 0736 Gross per 24 hour  Intake 900 ml  Output 1200 ml  Net -300 ml       Physical Exam: BP 138/66   Pulse 60   Temp 97.9 F (36.6 C) (Oral)   Resp 18   Ht 5' 7.5" (1.715 m)   Wt 93.4 kg   SpO2 98%   BMI 31.76 kg/m  Constitutional: No distress . Vital signs reviewed. HENT: Normocephalic.  Atraumatic. Eyes: EOMI. No discharge. Cardiovascular: No JVD.  RRR. Respiratory: Normal effort.  No stridor.  Bilateral clear to auscultation. GI: Non-distended.  BS +. Skin: Warm and dry.  Intact. Psych: Normal mood.  Normal behavior. Musc: No edema in extremities.  No tenderness in extremities. Neuro: Alert Motor: RUE/RLE: 5/5 proximal distal LLE: 5/5 proximal distal, stable LUE: Shoulder abduction 2/5, elbow flexion/extension 2/5, distally 2-/5 with apraxia, appears stable  Assessment/Plan: 1. Functional deficits which require 3+ hours per day of interdisciplinary therapy in a comprehensive inpatient rehab setting.  Physiatrist is providing close team supervision and 24 hour management of active medical problems listed below.  Physiatrist and rehab team continue to assess barriers to discharge/monitor patient progress toward functional and  medical goals   Care Tool:  Bathing    Body parts bathed by patient: Chest,Abdomen,Left arm,Front perineal area,Right upper leg,Left upper leg,Face,Buttocks,Right lower leg,Left lower leg   Body parts bathed by helper: Right arm     Bathing assist Assist Level: Minimal Assistance - Patient > 75%     Upper Body Dressing/Undressing Upper body dressing   What is the patient wearing?: Pull over shirt    Upper body assist Assist Level: Contact Guard/Touching assist    Lower Body Dressing/Undressing Lower body dressing      What is the patient wearing?: Underwear/pull up,Pants     Lower body assist Assist for lower body dressing: Contact Guard/Touching assist     Toileting Toileting    Toileting assist Assist for toileting: Supervision/Verbal cueing     Transfers Chair/bed transfer  Transfers assist     Chair/bed transfer assist level: Supervision/Verbal cueing     Locomotion Ambulation   Ambulation assist      Assist level: Minimal Assistance - Patient > 75% Assistive device: Walker-rolling Max distance: 178ft   Walk 10 feet activity   Assist     Assist level: Contact Guard/Touching assist Assistive device: Walker-rolling   Walk 50 feet activity   Assist Walk 50 feet with 2 turns activity did not occur: Safety/medical concerns  Assist level: Minimal Assistance - Patient > 75% Assistive device: Walker-rolling    Walk 150 feet activity   Assist Walk 150 feet activity did not occur: Safety/medical concerns  Assist level: Minimal Assistance - Patient > 75%  Assistive device: Walker-rolling    Walk 10 feet on uneven surface  activity   Assist Walk 10 feet on uneven surfaces activity did not occur: Safety/medical concerns   Assist level: Minimal Assistance - Patient > 75% Assistive device: Photographer Will patient use wheelchair at discharge?: No   Wheelchair activity did not occur: N/A          Wheelchair 50 feet with 2 turns activity    Assist    Wheelchair 50 feet with 2 turns activity did not occur: N/A       Wheelchair 150 feet activity     Assist  Wheelchair 150 feet activity did not occur: N/A        Medical Problem List and Plan: 1.  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  Impaired mobility -DVT/anticoagulation: Continue Lovenox             -antiplatelet therapy: Aspirin 81 mg daily and Plavix 75 mg daily for 3 weeks total then aspirin alone 3. Pain Management: Tylenol as needed 4. Mood: Provide emotional support             -antipsychotic agents: Seroquel started on 5/10 ?with some improvement 5. Neuropsych: This patient is? capable of making decisions on his own behalf. 6. Skin/Wound Care: Routine skin checks 7. Fluids/Electrolytes/Nutrition: Routine in and outs.               CMP ordered for tomorrow  5/8- will check BMP tomorrow AM 8.  Hypertension.  Continue Lisinopril 40 mg daily, Toprol-XL 75 mg daily.    Controlled on 5/17             Monitor with increased mobility 9.  Hyperlipidemia: Lipitor 10. Uncontrolled diabetes mellitus with hyperglycemia.  Hemoglobin A1c 8.3.  SSI.  Presently on Lantus insulin 12 units nightly.  Patient also on farxiga 10 mg daily prior to admission.    Glucotrol  5 daily (10 mg twice daily PTA) started on 5/10, increase to BID on 5/16          CBG (last 3)  Recent Labs    07/03/20 2101 07/04/20 0609 07/04/20 1146  GLUCAP 152* 108* 223*   Labile on 5/17, continue  monitoring ambulatory setting with potential further adjustments as necessary  11.  History of tobacco use.  Provide counseling 12.  Thrombocytopenia              Platelets 116 on 5/16  Continue to monitor 13.  Hypoalbuminemia  Supplement initiated on 5/5 14. Poor safety awareness with noncompliance  > 30 minutes spent in total in discharge planning between myself and PA regarding aforementioned, as well discussion regarding DME equipment, follow-up appointments, follow-up therapies, discharge medications, discharge recommendations, discharge disposition, answering questions.  Please see discharge summary as well.  LOS: 13 days A FACE TO FACE EVALUATION WAS PERFORMED  Cyann Venti [name] 07/04/2020, 12:19 PM

## 2020-07-04 NOTE — Progress Notes (Signed)
Occupational Therapy Session Note  Patient Details  Name: Frank Luna MRN: [id number] Date of Birth: Jul 16, 1960  Today's Date: 07/04/2020 OT Individual Time: 1003-1053 OT Individual Time Calculation (min): 50 min  and Today's Date: 07/04/2020 OT Missed Time: 10 Minutes Missed Time Reason: Patient unwilling/refused to participate without medical reason   Short Term Goals: Week 1:  OT Short Term Goal 1 (Week 1): Pt will don a tshirt with min A. OT Short Term Goal 2 (Week 1): Pt will don pants over feet min A. OT Short Term Goal 3 (Week 1): Pt will pull pants over hips with CGA for balance. OT Short Term Goal 4 (Week 1): Pt will use a long handled sponge as needed for washing feet and R arm. OT Short Term Goal 5 (Week 1): Pt will be able to use L hand as a stabilizing A with dressing tasks.  Skilled Therapeutic Interventions/Progress Updates:    Pt received in room with family present and consented to OT tx. Session focused on family education, family and pt provided with educational handouts regarding ADLs with L affected side, proper posture/positioning for affected LUE, and self ROM exercises. Demonstrated toilet transfer and walking in room with RW for family. Educated family that pt continues to require cuing for safety, cuing to turn all the way before sitting down on toilet or on chair. Educated on showering at home, that pt req CGA during shower transfers and can bathe with distant supervision, only requiring min A to thoroughly wash RUE. Pt reports he is ready to leave now, as he feels ready to go. Reminded pt that he has one more PT family education session following OT session to demo stairs for family. Pt agreeable, but very eager to leave. After encouragement, pt agreeable to spend remaining OT session focusing on LUE NMR. Pt able to move through full range of shoulder and elbow, demo's decreased wrist and finger extension, but can form gross grasp. NMES applied to L wrist extensors for  10 mins, at 42 maCC, 2 second ramp 10/10. Pt encourages to weight bear through extended fingers when e-stim activated to increase strength and improve tone. Pt refused any further OT tx and left after session with all needs met,chair alarm on, awaiting PT session to follow.  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 Mild L inattention Restrictions Weight Bearing Restrictions: No Other Position/Activity Restrictions: Poor safety awareness and insight into deficits   Vital Signs: Therapy Vitals Pulse Rate: 60 BP: 138/66 Pain: Pain Assessment Pain Score: 0-No pain    Therapy/Group: Individual Therapy  Bora Broner 07/04/2020, 10:39 AM

## 2020-07-04 NOTE — Progress Notes (Signed)
Patient discharged to home, accompanied by his wife. 

## 2020-07-05 ENCOUNTER — Telehealth: Payer: Self-pay | Admitting: *Deleted

## 2020-07-05 NOTE — Telephone Encounter (Signed)
Transitional Care call--who you talked with Mrs Cavallaro and their step daughter    1. Are you/is patient experiencing any problems since coming home? Are there any questions regarding any aspect of care? NO 2. Are there any questions regarding medications administration/dosing? Are meds being taken as prescribed? Patient should review meds with caller to confirm  Yes he has his medications. Plavix through 5/20 and then just asa 81 mg. 3. Have there been any falls? NO 4. Has Home Health been to the house and/or have they contacted you? If not, have you tried to contact them? Can we help you contact them? They were having problems getting a HH agency arranged May be going to outpt neuro rehab TBD 5. Are bowels and bladder emptying properly? Are there any unexpected incontinence issues? If applicable, is patient following bowel/bladder programs? NO problems 6. Any fevers, problems with breathing, unexpected pain?  NO 7. Are there any skin problems or new areas of breakdown? NO 8. Has the patient/family member arranged specialty MD follow up (ie cardiology/neurology/renal/surgical/etc)?  Can we help arrange? I have given them the number for [facility]. Referral is in place, Dr Roda Shutters had made an earlier referral. I have encouraged them to make appt with PCP Dr Sherril Croon to manage medications and diabetes. 9. Does the patient need any other services or support that we can help arrange? No 10. Are caregivers following through as expected in assisting the patient? Yes 11. Has the patient quit smoking, drinking alcohol, or using drugs as recommended? Yes  Appointment Thursday 07/13/20 @ 9:40 arrive by 9:20 for appt with Dr Allena Katz Address reviewed and advised to watch mail for packet from our office with papers to fill out and bring to appt. [address]

## 2020-07-13 ENCOUNTER — Encounter: Payer: Medicaid Other | Attending: Physical Medicine & Rehabilitation | Admitting: Physical Medicine & Rehabilitation

## 2020-07-26 ENCOUNTER — Other Ambulatory Visit: Payer: Self-pay | Admitting: Family Medicine

## 2020-07-26 DIAGNOSIS — L723 Sebaceous cyst: Secondary | ICD-10-CM

## 2020-08-15 ENCOUNTER — Ambulatory Visit: Payer: Medicaid Other | Admitting: Adult Health

## 2020-08-15 ENCOUNTER — Encounter: Payer: Self-pay | Admitting: Adult Health

## 2020-08-15 ENCOUNTER — Other Ambulatory Visit: Payer: Self-pay

## 2020-08-15 VITALS — BP 138/62 | HR 72 | Ht 67.0 in | Wt 206.0 lb

## 2020-08-15 DIAGNOSIS — I639 Cerebral infarction, unspecified: Secondary | ICD-10-CM

## 2020-08-15 DIAGNOSIS — I69354 Hemiplegia and hemiparesis following cerebral infarction affecting left non-dominant side: Secondary | ICD-10-CM | POA: Diagnosis not present

## 2020-08-15 DIAGNOSIS — Z720 Tobacco use: Secondary | ICD-10-CM

## 2020-08-15 DIAGNOSIS — F0630 Mood disorder due to known physiological condition, unspecified: Secondary | ICD-10-CM

## 2020-08-15 DIAGNOSIS — E1165 Type 2 diabetes mellitus with hyperglycemia: Secondary | ICD-10-CM

## 2020-08-15 DIAGNOSIS — E785 Hyperlipidemia, unspecified: Secondary | ICD-10-CM

## 2020-08-15 DIAGNOSIS — I10 Essential (primary) hypertension: Secondary | ICD-10-CM | POA: Diagnosis not present

## 2020-08-15 MED ORDER — QUETIAPINE FUMARATE 25 MG PO TABS: 12.5000 mg | ORAL_TABLET | Freq: Every day | ORAL | 3 refills | Status: DC

## 2020-08-15 NOTE — Patient Instructions (Addendum)
Continue to do therapies at home as you have been - if you wish to pursue therapies, please let me know  Would recommend restarting Seroquel 0.5 tabs nightly which can help with sleep and anxiety (increased irritability)   Continue aspirin 81 mg daily  and atorvastatin  for secondary stroke prevention  Continue to follow up with PCP regarding cholesterol, blood pressure and diabetes management  Maintain strict control of hypertension with blood pressure goal below 130/90, diabetes with hemoglobin A1c goal below 7% and cholesterol with LDL cholesterol (bad cholesterol) goal below 70 mg/dL.   Ensure you reschedule follow up visit with Dr. Allena Katz       Followup in the future with me in 3 months or call earlier if needed       Thank you for coming to see [place] at [facility]. I hope we have been able to provide you high quality care today.  You may receive a patient satisfaction survey over the next few weeks. We would appreciate your feedback and comments so that we may continue to improve ourselves and the health of our patients.    Stroke Prevention Some medical conditions and lifestyle choices can lead to a higher risk for a stroke. You can help to prevent a stroke by eating healthy foods and exercising. It also helps to not smoke and to manage any health problems youmay have. How can this condition affect me? A stroke is an emergency. It should be treated right away. A stroke can lead to brain damage or threaten your life. There is a better chance of surviving andgetting better after a stroke if you get medical help right away. What can increase my risk? The following medical conditions may increase your risk of a stroke: Diseases of the heart and blood vessels (cardiovascular disease). High blood pressure (hypertension). Diabetes. High cholesterol. Sickle cell disease. Problems with blood clotting. Being very overweight. Sleeping problems (obstructivesleep  apnea). Other risk factors include: Being older than age 19. A history of blood clots, stroke, or mini-stroke (TIA). Race, ethnic background, or a family history of stroke. Smoking or using tobacco products. Taking birth control pills, especially if you smoke. Heavy alcohol and drug use. Not being active. What actions can I take to prevent this? Manage your health conditions High cholesterol. Eat a healthy diet. If this is not enough to manage your cholesterol, you may need to take medicines. Take medicines as told by your doctor. High blood pressure. Try to keep your blood pressure below 130/80. If your blood pressure cannot be managed through a healthy diet and regular exercise, you may need to take medicines. Take medicines as told by your doctor. Ask your doctor if you should check your blood pressure at home. Have your blood pressure checked every year. Diabetes. Eat a healthy diet and get regular exercise. If your blood sugar (glucose) cannot be managed through diet and exercise, you may need to take medicines. Take medicines as told by your doctor. Talk to your doctor about getting checked for sleeping problems. Signs of a problem can include: Snoring a lot. Feeling very tired. Make sure that you manage any other conditions you have. Nutrition  Follow instructions from your doctor about what to eat or drink. You may be told to: Eat and drink fewer calories each day. Limit how much salt (sodium) you use to 1,500 milligrams (mg) each day. Use only healthy fats for cooking, such as olive oil, canola oil, and sunflower oil. Eat healthy foods.  To do this: Choose foods that are high in fiber. These include whole grains, and fresh fruits and vegetables. Eat at least 5 servings of fruits and vegetables a day. Try to fill one-half of your plate with fruits and vegetables at each meal. Choose low-fat (lean) proteins. These include low-fat cuts of meat, chicken without skin, fish,  tofu, beans, and nuts. Eat low-fat dairy products. Avoid foods that: Are high in salt. Have saturated fat. Have trans fat. Have cholesterol. Are processed or pre-made. Count how many carbohydrates you eat and drink each day.  Lifestyle If you drink alcohol: Limit how much you have to: 0-1 drink a day for women who are not pregnant. 0-2 drinks a day for men. Know how much alcohol is in your drink. In the [place] one drink equals one 12 oz bottle of beer ( ), one 5 oz glass of wine ( ), or one 1 oz glass of hard liquor (56mL). Do not smoke or use any products that have nicotine or tobacco. If you need help quitting, ask your doctor. Avoid secondhand smoke. Do not use drugs. Activity  Try to stay at a healthy weight. Get at least 30 minutes of exercise on most days, such as: Fast walking. Biking. Swimming.  Medicines Take over-the-counter and prescription medicines only as told by your doctor. Avoid taking birth control pills. Talk to your doctor about the risks of taking birth control pills if: You are over 46 years old. You smoke. You get very bad headaches. You have had a blood clot. Where to find more information American Stroke Association: www.strokeassociation.org Get help right away if: You or a loved one has any signs of a stroke. "BE FAST" is an easy way to remember the warning signs: B - Balance. Dizziness, sudden trouble walking, or loss of balance. E - Eyes. Trouble seeing or a change in how you see. F - Face. Sudden weakness or loss of feeling of the face. The face or eyelid may droop on one side. A - Arms. Weakness or loss of feeling in an arm. This happens all of a sudden and most often on one side of the body. S - Speech. Sudden trouble speaking, slurred speech, or trouble understanding what people say. T - Time. Time to call emergency services. Write down what time symptoms started. You or a loved one has other signs of a stroke, such as: A sudden,  very bad headache with no known cause. Feeling like you may vomit (nausea). Vomiting. A seizure. These symptoms may be an emergency. Get help right away. Call your local emergency services (911 in the [place] Do not wait to see if the symptoms will go away. Do not drive yourself to the hospital. Summary You can help to prevent a stroke by eating healthy, exercising, and not smoking. It also helps to manage any health problems you have. Do not smoke or use any products that contain nicotine or tobacco. Get help right away if you or a loved one has any signs of a stroke. This information is not intended to replace advice given to you by your health care provider. Make sure you discuss any questions you have with your healthcare provider. Document Revised: 09/06/2019 Document Reviewed: 09/06/2019 Elsevier Patient Education  2022 [employer].

## 2020-08-15 NOTE — Progress Notes (Signed)
[facility] [address] [place]. [place] [address] [phone]       HOSPITAL FOLLOW UP NOTE  [name] Date of Birth:  December 04, 1960 Medical Record Number:  [id number]   Reason for Referral:  hospital stroke follow up    SUBJECTIVE:   CHIEF COMPLAINT:  Chief Complaint  Patient presents with   Follow-up    TR with spouse Frank Luna Pt is having L side weakness and imbalance.     HPI:   [name] is a 60 y.o. male with history of DM2, HTN, thrombocytopenia, prior stroke in 2017 with minimal to no residual symptoms, smoker ~1pack per day who presented to [facility] ED on 06/17/2020 with left sided weakness.  Personally reviewed hospitalization pertinent progress notes, lab work and imaging summary provided.  Pt initially signed out AMA but returned and an MRI revealed a 1 cm R corona radiata infarct likely due to small vessel disease.  LDL 37.  A1c 8.3.  Recommended DAPT for 3 weeks and aspirin alone - on aspirin 325 mg daily and clopidogrel 75 mg daily PTA.  HTN stable on high end.  Continued home dose atorvastatin 40 mg daily.  Current tobacco use with smoking cessation counseling provided.  Prior stroke history 01/2016 with MRI showing left CR small infarct, old left BG bilateral thalamic infarcts.  Residual deficits of left-sided weakness and slurred speech and discharged to CIR  Stroke: Rt CR infarct likely due to likely due to small vessel disease.  CT head - No acute findings. No intracranial mass, hemorrhage or edema.  MRI head - 1 cm acute infarction in the corona radiata white matter on the right.  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   2D Echo - EF 60 - 65%. No cardiac source of emboli identified.  Sars Corona Virus 2 - negative LDL - 37 HgbA1c - 8.3 UDS - negative VTE prophylaxis - lovenox aspirin 325 mg  daily and clopidogrel 75 mg daily prior to admission, now on aspirin 81 mg daily and plavix 75 DAPT for 3 weeks and then ASA alone. Patient will be counseled to be compliant with his antithrombotic medications Ongoing aggressive stroke risk factor management Therapy recommendations:  CIR Disposition:  d/c to CIR 06/21/2020-07/04/2020  Today, 08/15/2020, [name] is being seen for hospital follow-up accompanied by his wife, Frank Luna. He has been doing well since discharge with improvement of left-sided weakness and speech.  He has not been seen by any type of therapies has been doing his own exercises at home.  He is ambulating without assistive device and did have 1 fall since returning home thankfully without injury but no recent falls.  Denies residual cognitive difficulties but wife is concerned regarding mood changes and he is quick to become irritable.  He was started on Seroquel during hospitalization with stabilization but he is not currently taking.  Denies new stroke/TIA symptoms.  Completed 3 weeks DAPT and remains on aspirin alone as well as atorvastatin 40 mg daily without associated side effects.  Blood pressure today initially elevated but on recheck 138/62.  Compliant on amlodipine and lisinopril.  He reports recently being started on metoprolol by PCP but had difficulty tolerating due to nausea and decreased extremity therefore he self discontinued with resolution of symptoms once stopping.  Routinely monitors glucose levels at home which have also been stable.  He does  endorse continued tobacco use reporting less than 1 pack/day.  No further concerns at this time.    ROS:   14 system review of systems performed and negative with exception of those listed in HPI  PMH:  Past Medical History:  Diagnosis Date   Diabetes (HCC)    Hypertension    Stroke (HCC)    Thrombocytopenia (HCC)     PSH: History reviewed. No pertinent surgical history.  Social History:  Social History    Socioeconomic History   Marital status: Married    Spouse name: Not on file   Number of children: Not on file   Years of education: Not on file   Highest education level: Not on file  Occupational History   Not on file  Tobacco Use   Smoking status: Every Day    Packs/day: 1.00    Years: 45.00    Pack years: 45.00    Types: Cigarettes   Smokeless tobacco: Never  Vaping Use   Vaping Use: Never used  Substance and Sexual Activity   Alcohol use: No   Drug use: No   Sexual activity: Not on file  Other Topics Concern   Not on file  Social History Narrative   Not on file   Social Determinants of Health   Financial Resource Strain: Not on file  Food Insecurity: Not on file  Transportation Needs: Not on file  Physical Activity: Not on file  Stress: Not on file  Social Connections: Not on file  Intimate Partner Violence: Not on file    Family History:  Family History  Problem Relation Age of Onset   Stroke Paternal Great-grandfather     Medications:   Current Outpatient Medications on File Prior to Visit  Medication Sig Dispense Refill   acetaminophen (TYLENOL) 325 MG tablet Take 2 tablets (650 mg total) by mouth every 6 (six) hours as needed for mild pain, headache or moderate pain (or Fever >/= 101).     amLODipine (NORVASC) 5 MG tablet Take 1 tablet (5 mg total) by mouth daily. 30 tablet 0   aspirin EC 81 MG EC tablet Take 1 tablet (81 mg total) by mouth daily. Swallow whole. 30 tablet 0   atorvastatin (LIPITOR) 40 MG tablet Take 1 tablet (40 mg total) by mouth daily at 6 PM. 90 tablet 3   cholecalciferol (VITAMIN D) 25 MCG tablet Take 1 tablet (1,000 Units total) by mouth daily. 30 tablet 0   dapagliflozin propanediol (FARXIGA) 10 MG TABS tablet Take 1 tablet (10 mg total) by mouth daily. 30 tablet 0   glipiZIDE (GLUCOTROL) 5 MG tablet Take 1 tablet (5 mg total) by mouth 2 (two) times daily before a meal. 60 tablet 0   insulin glargine (LANTUS) 100 UNIT/ML Solostar  Pen Inject 12 Units into the skin at bedtime. 15 mL 11   lisinopril (ZESTRIL) 40 MG tablet Take 1 tablet (40 mg total) by mouth daily. 30 tablet 0   nicotine (NICODERM CQ - DOSED IN MG/24 HOURS) 21 mg/24hr patch NicoDerm patch 21 mg daily x1 week and 14 mg patch daily x3 weeks then 7 mg patch daily x3 weeks and stop 28 patch 0   No current facility-administered medications on file prior to visit.    Allergies:   Allergies  Allergen Reactions   Penicillins     Unknown reaction      OBJECTIVE:  Physical Exam  Vitals:   08/15/20 1113  BP: 138/62  Pulse: 72  Weight: 206  lb (93.4 kg)  Height: 5\' 7"  (1.702 m)   Body mass index is 32.26 kg/m. No results found.   Post stroke PHQ 2/9 Depression screen PHQ 2/9 08/15/2020  Decreased Interest 1  Down, Depressed, Hopeless 0  PHQ - 2 Score 1     General: well developed, well nourished, pleasant middle-age Caucasian male, seated, in no evident distress Head: head normocephalic and atraumatic.   Neck: supple with no carotid or supraclavicular bruits Cardiovascular: regular rate and rhythm, no murmurs Musculoskeletal: no deformity Skin:  no rash/petichiae Vascular:  Normal pulses all extremities   Neurologic Exam Mental Status: Awake and fully alert.  Mild dysarthria.  No evidence of aphasia.  Oriented to place and time. Recent and remote memory intact. Attention span, concentration and fund of knowledge appropriate during visit. Mood and affect appropriate during our visit but was verbally aggressive towards CMA.  Cranial Nerves: Fundoscopic exam reveals sharp disc margins. Pupils equal, briskly reactive to light. Extraocular movements full without nystagmus. Visual fields full to confrontation. Hearing intact. Facial sensation intact.  Mild left lower facial weakness.  Tongue, and palate moves normally and symmetrically.  Motor: Normal strength, bulk and tone on right side.  LUE: 4+/5 with decreased grip strength and swelling and  increased tone.  LLE: 4+/5 hip flexor and ankle dorsiflexion Sensory.: intact to touch , pinprick , position and vibratory sensation.  Coordination: Rapid alternating movements normal in all extremities except left hand. Finger-to-nose performed accurately RUE and heel-to-shin performed accurately bilaterally. Gait and Station: Arises from chair without difficulty. Stance is normal. Gait demonstrates decreased stride length and step height LLE and mild imbalance without use of assistive device.  Tandem walk and heel toe not attempted Reflexes: 1+ and symmetric. Toes downgoing.     NIHSS  2 Modified Rankin  3      ASSESSMENT: Frank Luna is a 61 y.o. year old male with recent right CR infarct on 07/17/2020 secondary to small vessel disease after presenting with left-sided weakness.  Vascular risk factors include HTN, HLD, DM, history of stroke 01/2016 and tobacco use.      PLAN:  R CR stroke :  Residual deficit: Left hemiparesis, dysarthria and mood changes.  He declines interest in formal therapies and wishes to continue his current home exercise routine.  He was advised to contact office in the future if he wishes to proceed with therapy.  Advised to restart Seroquel 12.5 mg nightly as prescribed during CIR for mood stabilization.  Advised to reschedule follow-up with Dr. 02/2016 PMR Continue aspirin 81 mg daily  and atorvastatin 40 mg daily for secondary stroke prevention.   Discussed secondary stroke prevention measures and importance of close PCP follow up for aggressive stroke risk factor management  HTN: BP goal <130/90.  Stable on current regimen per PCP HLD: LDL goal <70. Recent LDL 37 -continue atorvastatin 40 mg daily per PCP.  DMII: A1c goal<7.0. Recent A1c 8.3.  Monitor by PCP Tobacco use: Discussed importance of complete tobacco cessation.  He verbalized understanding and does have plans on quitting    Follow up in 3 months or call earlier if needed   CC:  [facility]  provider: Dr. Allena Katz PCP: Pearlean Brownie Graylin Shiver    I spent 46 minutes of face-to-face and non-face-to-face time with patient and wife.  This included previsit chart review including recent hospitalization, lab review, study review, order entry, electronic health record documentation, and prolonged patient and wife education and discussion regarding recent stroke including etiology, secondary  stroke prevention measures and aggressive stroke risk factor management, residual deficits and typical recovery time, and answered all questions to patient satisfaction   Ihor AustinJessica McCue, [facility] Regional Medical CenterGNP-BC  [facility] [address] [place], [place] [id number]  Phone [phone] Fax [phone] Note: This document was prepared with digital dictation and possible smart phrase technology. Any transcriptional errors that result from this process are unintentional.

## 2020-08-17 ENCOUNTER — Encounter: Payer: Self-pay | Admitting: General Surgery

## 2020-08-17 ENCOUNTER — Other Ambulatory Visit: Payer: Self-pay

## 2020-08-17 ENCOUNTER — Ambulatory Visit (INDEPENDENT_AMBULATORY_CARE_PROVIDER_SITE_OTHER): Payer: Medicaid Other | Admitting: General Surgery

## 2020-08-17 VITALS — BP 134/69 | HR 104 | Temp 98.5°F | Ht 67.0 in | Wt 205.0 lb

## 2020-08-17 DIAGNOSIS — L723 Sebaceous cyst: Secondary | ICD-10-CM | POA: Diagnosis not present

## 2020-08-18 NOTE — Progress Notes (Signed)
I agree with the above plan 

## 2020-08-18 NOTE — Progress Notes (Signed)
Frank Luna; [id number]; 1960-07-22   HPI Patient is a 60 year old white male who was referred to my care by Dr. Sherril Croon for evaluation and treatment of an infected sebaceous cyst on his back.  Patient has multiple medical problems and recently had a stroke.  He was at a skilled nursing facility and recently was discharged home.  He has had some cloudy white fluid emanating from a sebaceous cyst on his back.  It is tender to touch.  He denies any fevers. Past Medical History:  Diagnosis Date   Diabetes (HCC)    Hypertension    Stroke (HCC)    Thrombocytopenia (HCC)     History reviewed. No pertinent surgical history.  Family History  Problem Relation Age of Onset   Stroke Paternal Great-grandfather     Current Outpatient Medications on File Prior to Visit  Medication Sig Dispense Refill   acetaminophen (TYLENOL) 325 MG tablet Take 2 tablets (650 mg total) by mouth every 6 (six) hours as needed for mild pain, headache or moderate pain (or Fever >/= 101).     amLODipine (NORVASC) 5 MG tablet Take 1 tablet (5 mg total) by mouth daily. 30 tablet 0   aspirin EC 81 MG EC tablet Take 1 tablet (81 mg total) by mouth daily. Swallow whole. 30 tablet 0   atorvastatin (LIPITOR) 40 MG tablet Take 1 tablet (40 mg total) by mouth daily at 6 PM. 90 tablet 3   cholecalciferol (VITAMIN D) 25 MCG tablet Take 1 tablet (1,000 Units total) by mouth daily. 30 tablet 0   dapagliflozin propanediol (FARXIGA) 10 MG TABS tablet Take 1 tablet (10 mg total) by mouth daily. 30 tablet 0   glipiZIDE (GLUCOTROL) 5 MG tablet Take 1 tablet (5 mg total) by mouth 2 (two) times daily before a meal. 60 tablet 0   insulin glargine (LANTUS) 100 UNIT/ML Solostar Pen Inject 12 Units into the skin at bedtime. 15 mL 11   lisinopril (ZESTRIL) 40 MG tablet Take 1 tablet (40 mg total) by mouth daily. 30 tablet 0   nicotine (NICODERM CQ - DOSED IN MG/24 HOURS) 21 mg/24hr patch NicoDerm patch 21 mg daily x1 week and 14 mg patch daily x3  weeks then 7 mg patch daily x3 weeks and stop 28 patch 0   QUEtiapine (SEROQUEL) 25 MG tablet Take 0.5 tablets (12.5 mg total) by mouth at bedtime. 45 tablet 3   No current facility-administered medications on file prior to visit.    Allergies  Allergen Reactions   Penicillins     Unknown reaction    Social History   Substance and Sexual Activity  Alcohol Use No    Social History   Tobacco Use  Smoking Status Every Day   Packs/day: 1.00   Years: 45.00   Pack years: 45.00   Types: Cigarettes  Smokeless Tobacco Never    Review of Systems  Constitutional:  Positive for malaise/fatigue.  HENT: Negative.    Eyes: Negative.   Respiratory: Negative.    Cardiovascular: Negative.   Gastrointestinal: Negative.   Genitourinary: Negative.   Musculoskeletal:  Positive for back pain, joint pain and neck pain.  Skin: Negative.   Neurological:  Positive for tremors, sensory change, weakness and headaches.  Endo/Heme/Allergies:  Bruises/bleeds easily.  Psychiatric/Behavioral: Negative.     Objective   Vitals:   08/17/20 1124  BP: 134/69  Pulse: (!) 104  Temp: 98.5 F (36.9 C)  SpO2: 94%    Physical Exam Vitals reviewed.  Constitutional:  Appearance: Normal appearance. He is not ill-appearing.  HENT:     Head: Normocephalic and atraumatic.  Cardiovascular:     Rate and Rhythm: Normal rate and regular rhythm.     Heart sounds: Normal heart sounds. No murmur heard.   No friction rub. No gallop.  Pulmonary:     Effort: Pulmonary effort is normal. No respiratory distress.     Breath sounds: Normal breath sounds. No stridor. No wheezing, rhonchi or rales.  Skin:    General: Skin is warm and dry.     Comments: 1.5 cm inflamed sebaceous cyst on his midback to the left of midline.  I was able to express some sebum and purulent fluid.  Minimal redness is noted.  The cyst decreased in size.  Neurological:     Mental Status: He is alert and oriented to person, place, and  time.    Assessment  Infected sebaceous cyst, back, resolving.  As the cyst is small and resolving, no need for antibiotic therapy at this time.  Formal excision is not recommended given his recent CVA, but I think this will resolve conservatively. Plan  Continue expressing the cyst daily.  Wife said she can do this.  Follow-up in my office in 2 weeks for wound check.

## 2020-08-30 ENCOUNTER — Emergency Department (HOSPITAL_COMMUNITY)
Admission: EM | Admit: 2020-08-30 | Discharge: 2020-08-31 | Discharge status: Against Medical Advice | Payer: Medicaid Other | Attending: Emergency Medicine | Admitting: Emergency Medicine

## 2020-08-30 ENCOUNTER — Encounter (HOSPITAL_COMMUNITY): Payer: Self-pay

## 2020-08-30 ENCOUNTER — Emergency Department (HOSPITAL_COMMUNITY): Payer: Medicaid Other

## 2020-08-30 DIAGNOSIS — F17210 Nicotine dependence, cigarettes, uncomplicated: Secondary | ICD-10-CM | POA: Diagnosis not present

## 2020-08-30 DIAGNOSIS — I639 Cerebral infarction, unspecified: Secondary | ICD-10-CM | POA: Diagnosis not present

## 2020-08-30 DIAGNOSIS — Z7982 Long term (current) use of aspirin: Secondary | ICD-10-CM | POA: Diagnosis not present

## 2020-08-30 DIAGNOSIS — R531 Weakness: Secondary | ICD-10-CM | POA: Diagnosis present

## 2020-08-30 DIAGNOSIS — W06XXXA Fall from bed, initial encounter: Secondary | ICD-10-CM | POA: Diagnosis not present

## 2020-08-30 DIAGNOSIS — I10 Essential (primary) hypertension: Secondary | ICD-10-CM | POA: Insufficient documentation

## 2020-08-30 DIAGNOSIS — Z794 Long term (current) use of insulin: Secondary | ICD-10-CM | POA: Diagnosis not present

## 2020-08-30 DIAGNOSIS — Z20822 Contact with and (suspected) exposure to covid-19: Secondary | ICD-10-CM | POA: Insufficient documentation

## 2020-08-30 DIAGNOSIS — Z79899 Other long term (current) drug therapy: Secondary | ICD-10-CM | POA: Diagnosis not present

## 2020-08-30 DIAGNOSIS — E119 Type 2 diabetes mellitus without complications: Secondary | ICD-10-CM | POA: Insufficient documentation

## 2020-08-30 LAB — CBC WITH DIFFERENTIAL/PLATELET
Abs Immature Granulocytes: 0.02 10*3/uL (ref 0.00–0.07)
Basophils Absolute: 0.1 10*3/uL (ref 0.0–0.1)
Basophils Relative: 1 %
Eosinophils Absolute: 0.2 10*3/uL (ref 0.0–0.5)
Eosinophils Relative: 3 %
HCT: 45.2 % (ref 39.0–52.0)
Hemoglobin: 15.3 g/dL (ref 13.0–17.0)
Immature Granulocytes: 0 %
Lymphocytes Relative: 23 %
Lymphs Abs: 1.7 10*3/uL (ref 0.7–4.0)
MCH: 29.9 pg (ref 26.0–34.0)
MCHC: 33.8 g/dL (ref 30.0–36.0)
MCV: 88.3 fL (ref 80.0–100.0)
Monocytes Absolute: 0.7 10*3/uL (ref 0.1–1.0)
Monocytes Relative: 10 %
Neutro Abs: 4.5 10*3/uL (ref 1.7–7.7)
Neutrophils Relative %: 63 %
Platelets: 105 10*3/uL — ABNORMAL LOW (ref 150–400)
RBC: 5.12 MIL/uL (ref 4.22–5.81)
RDW: 13 % (ref 11.5–15.5)
WBC: 7.1 10*3/uL (ref 4.0–10.5)
nRBC: 0 % (ref 0.0–0.2)

## 2020-08-30 LAB — BASIC METABOLIC PANEL
Anion gap: 6 (ref 5–15)
BUN: 18 mg/dL (ref 6–20)
CO2: 26 mmol/L (ref 22–32)
Calcium: 9.5 mg/dL (ref 8.9–10.3)
Chloride: 104 mmol/L (ref 98–111)
Creatinine, Ser: 0.94 mg/dL (ref 0.61–1.24)
GFR, Estimated: >60 mL/min (ref >60)
Glucose, Bld: 115 mg/dL — ABNORMAL HIGH (ref 70–99)
Potassium: 3.9 mmol/L (ref 3.5–5.1)
Sodium: 136 mmol/L (ref 135–145)

## 2020-08-30 LAB — URINALYSIS, ROUTINE W REFLEX MICROSCOPIC
Bacteria, UA: NONE SEEN
Bilirubin Urine: NEGATIVE
Glucose, UA: 50 mg/dL — AB
Ketones, ur: NEGATIVE mg/dL
Leukocytes,Ua: NEGATIVE
Nitrite: NEGATIVE
Protein, ur: 100 mg/dL — AB
Specific Gravity, Urine: 1.014 (ref 1.005–1.030)
pH: 5 (ref 5.0–8.0)

## 2020-08-30 LAB — PROTIME-INR
INR: 1.1 (ref 0.8–1.2)
Prothrombin Time: 14.1 seconds (ref 11.4–15.2)

## 2020-08-30 NOTE — ED Notes (Addendum)
Attempted to ambulate patient in room.  Uses a walker at home. States he feels "different" in his LLE than his normal.  Seems harder to ambulate.  Assisted back to bed when became unsteady.

## 2020-08-30 NOTE — ED Provider Notes (Signed)
[facility] EMERGENCY DEPARTMENT Provider Note   CSN: [id number] Arrival date & time: 08/30/20  1512     History No chief complaint on file.   Frank Luna is a 60 y.o. male.  HPI     60 year old male comes in with chief complaint of fall.  He has history of diabetes, hypertension and recently had multiple strokes on the right side with residual left-sided weakness.  Patient reports that his weakness is improved, but is not back to baseline.  This morning when he got out of the bed, he fell down.  He is not sure why he fell.  He suspects that he was weak on the left side.  He also had some numbness in his face early on on the left side, which is better.  Last known normal about 10 PM when he went to sleep.  Currently, patient feels primarily that he has some difficulty in walking.  He feels like his strength is back to baseline and the numbness has resolved.  Past Medical History:  Diagnosis Date   Diabetes (HCC)    Hypertension    Stroke (HCC)    Thrombocytopenia [facility])     Patient Active Problem List   Diagnosis Date Noted   Labile blood glucose    Benign essential HTN    Noncompliance    Monoparesis of arm (HCC)    Hypoalbuminemia due to protein-calorie malnutrition (HCC)    Uncontrolled type 2 diabetes mellitus with hyperglycemia (HCC)    Small vessel disease, cerebrovascular 06/21/2020   Thrombocytopenia (HCC)    Stroke (HCC) 06/18/2020   Acute cerebral infarction (HCC) 06/17/2020   Hyperlipidemia 02/06/2016   Cognitive impairment 02/06/2016   Morbid obesity (HCC) 02/06/2016   Diabetes (HCC) 02/06/2016   Tobacco use disorder    Essential hypertension     History reviewed. No pertinent surgical history.     Family History  Problem Relation Age of Onset   Stroke Paternal Great-grandfather     Social History   Tobacco Use   Smoking status: Every Day    Packs/day: 1.00    Years: 45.00    Pack years: 45.00    Types: Cigarettes    Smokeless tobacco: Never  Vaping Use   Vaping Use: Never used  Substance Use Topics   Alcohol use: No   Drug use: No    Home Medications Prior to Admission medications   Medication Sig Start Date End Date Taking? Authorizing Provider  acetaminophen (TYLENOL) 325 MG tablet Take 2 tablets (650 mg total) by mouth every 6 (six) hours as needed for mild pain, headache or moderate pain (or Fever >/= 101). 07/03/20   Angiulli, Mcarthur Rossetti, PA-C  amLODipine (NORVASC) 5 MG tablet Take 1 tablet (5 mg total) by mouth daily. 07/03/20   Angiulli, Mcarthur Rossetti, PA-C  aspirin EC 81 MG EC tablet Take 1 tablet (81 mg total) by mouth daily. Swallow whole. 06/21/20   Karsten Ro, MD  atorvastatin (LIPITOR) 40 MG tablet Take 1 tablet (40 mg total) by mouth daily at 6 PM. 07/03/20   Angiulli, Mcarthur Rossetti, PA-C  cholecalciferol (VITAMIN D) 25 MCG tablet Take 1 tablet (1,000 Units total) by mouth daily. 07/04/20   Angiulli, Mcarthur Rossetti, PA-C  dapagliflozin propanediol (FARXIGA) 10 MG TABS tablet Take 1 tablet (10 mg total) by mouth daily. 07/03/20   Angiulli, Mcarthur Rossetti, PA-C  glipiZIDE (GLUCOTROL) 5 MG tablet Take 1 tablet (5 mg total) by mouth 2 (two) times daily before a  meal. 07/04/20   Angiulli, Mcarthur Rossetti, PA-C  insulin glargine (LANTUS) 100 UNIT/ML Solostar Pen Inject 12 Units into the skin at bedtime. 07/03/20   Angiulli, Mcarthur Rossetti, PA-C  lisinopril (ZESTRIL) 40 MG tablet Take 1 tablet (40 mg total) by mouth daily. 07/03/20   Angiulli, Mcarthur Rossetti, PA-C  nicotine (NICODERM CQ - DOSED IN MG/24 HOURS) 21 mg/24hr patch NicoDerm patch 21 mg daily x1 week and 14 mg patch daily x3 weeks then 7 mg patch daily x3 weeks and stop 07/03/20   Angiulli, Mcarthur Rossetti, PA-C  QUEtiapine (SEROQUEL) 25 MG tablet Take 0.5 tablets (12.5 mg total) by mouth at bedtime. 08/15/20   Ihor Austin, NP    Allergies    Penicillins  Review of Systems   Review of Systems  Constitutional:  Positive for activity change.  Eyes:  Positive for visual disturbance.   Respiratory:  Negative for shortness of breath.   Cardiovascular:  Negative for chest pain.  Neurological:  Positive for weakness.  All other systems reviewed and are negative.  Physical Exam Updated Vital Signs BP (!) 151/94   Pulse 73   Temp 98.1 F (36.7 C) (Oral)   Resp 17   SpO2 96%   Physical Exam Vitals and nursing note reviewed.  Constitutional:      Appearance: He is well-developed.  HENT:     Head: Atraumatic.  Eyes:     Extraocular Movements: Extraocular movements intact.     Pupils: Pupils are equal, round, and reactive to light.  Cardiovascular:     Rate and Rhythm: Normal rate.  Pulmonary:     Effort: Pulmonary effort is normal.  Musculoskeletal:     Cervical back: Neck supple.  Skin:    General: Skin is warm.  Neurological:     Mental Status: He is alert and oriented to person, place, and time.     Motor: Weakness present.     Comments: Left upper extremity strength is 3/5 Left lower extremity strength is 4 out of 5 Right upper and lower extremity strength is 5 out of 5 Gross sensory exam is normal over the face, upper and lower extremity    ED Results / Procedures / Treatments   Labs (all labs ordered are listed, but only abnormal results are displayed) Labs Reviewed  BASIC METABOLIC PANEL - Abnormal; Notable for the following components:      Result Value   Glucose, Bld 115 (*)    All other components within normal limits  CBC WITH DIFFERENTIAL/PLATELET - Abnormal; Notable for the following components:   Platelets 105 (*)    All other components within normal limits  URINALYSIS, ROUTINE W REFLEX MICROSCOPIC - Abnormal; Notable for the following components:   Glucose, UA 50 (*)    Hgb urine dipstick SMALL (*)    Protein, ur 100 (*)    All other components within normal limits  SARS CORONAVIRUS 2 (TAT 6-24 HRS)  PROTIME-INR    EKG None  Radiology CT Head Wo Contrast  Result Date: 08/30/2020 CLINICAL DATA:  Head trauma EXAM: CT HEAD  WITHOUT CONTRAST TECHNIQUE: Contiguous axial images were obtained from the base of the skull through the vertex without intravenous contrast. COMPARISON:  06/17/2020 FINDINGS: Brain: There is atrophy and chronic small vessel disease changes. Old bilateral basal ganglia lacunar infarcts. No acute intracranial abnormality. Specifically, no hemorrhage, hydrocephalus, mass lesion, acute infarction, or significant intracranial injury. Vascular: No hyperdense vessel or unexpected calcification. Skull: No acute calvarial abnormality. Sinuses/Orbits: No acute findings.  Mucosal thickening in the paranasal sinuses. Other: None IMPRESSION: Atrophy, chronic microvascular disease. No acute intracranial abnormality. Chronic sinusitis. Electronically Signed   By: Charlett NoseKevin  Dover M.D.   On: 08/30/2020 16:43   MR BRAIN WO CONTRAST  Result Date: 08/30/2020 CLINICAL DATA:  Initial evaluation for neuro deficit, stroke suspected. EXAM: MRI HEAD WITHOUT CONTRAST TECHNIQUE: Multiplanar, multiecho pulse sequences of the brain and surrounding structures were obtained without intravenous contrast. COMPARISON:  Prior CT from earlier same day and MRI from 06/25/2020. FINDINGS: Brain: Examination degraded by motion artifact. Diffuse prominence of the CSF containing spaces compatible generalized age-related cerebral atrophy. Patchy and confluent T2/FLAIR hyperintensity within the periventricular and deep white matter both cerebral hemispheres most consistent with chronic small vessel ischemic disease, fairly advanced in nature. Multiple scattered superimposed remote lacunar infarcts present about the bilateral basal ganglia, hemispheric cerebral white matter, and pons. There has been interval evolution of previously identified ischemic infarcts involving the right corona radiata and subcortical left frontal lobe, now late subacute to chronic in appearance with mild residual diffusion abnormality. There is an additional 6 mm subacute to chronic  appearing white matter infarct involving the posterior left frontoparietal centrum semi ovale, new as compared to most recent MRI (series 3, image 37). 1.3 cm acute ischemic linear infarct seen involving the posterior limb of the right internal capsule (series 3, image 26). Additional 1.6 cm linear acute ischemic infarct involving the overlying posterior right frontoparietal region (series 3, image 29). No associated hemorrhage or mass effect about these acute infarcts. No other evidence for acute ischemia. Gray-white matter differentiation otherwise maintained. No acute intracranial hemorrhage. Few small chronic micro hemorrhages noted about the deep gray nuclei, likely small vessel/hypertensive in nature. No mass lesion, midline shift or mass effect. No hydrocephalus or extra-axial fluid collection. Pituitary gland and suprasellar region within normal limits. Midline structures intact. Vascular: Major intracranial vascular flow voids are maintained. Skull and upper cervical spine: Craniocervical junction within normal limits. Bone marrow signal intensity normal. No scalp soft tissue abnormality. Sinuses/Orbits: Globes and orbital soft tissues demonstrate no acute finding. Mild scattered mucosal thickening noted throughout the paranasal sinuses. Bilateral mastoid effusions noted, stable from prior. Other: None. IMPRESSION: 1. 1.3 cm acute ischemic nonhemorrhagic infarct involving the posterior limb of the right internal capsule. 2. Additional 1.6 cm acute ischemic nonhemorrhagic infarct involving the overlying subcortical posterior right frontoparietal region. 3. Interval evolution of previously identified ischemic infarcts involving the right corona radiata and subcortical left frontal lobe, now late subacute to chronic in appearance. Additional evolving 6 mm subacute ischemic infarct at the posterior left centrum semi ovale, subacute in nature, but new as compared to most recent MRI from 06/25/2020. 4. Underlying  age-related cerebral atrophy with advanced chronic microvascular ischemic disease. Electronically Signed   By: Rise MuBenjamin  McClintock M.D.   On: 08/30/2020 23:39    Procedures Procedures   Medications Ordered in ED Medications - No data to display  ED Course  I have reviewed the triage vital signs and the nursing notes.  Pertinent labs & imaging results that were available during my care of the patient were reviewed by me and considered in my medical decision making (see chart for details).    MDM Rules/Calculators/A&P                          60 year old male comes in with chief complaint of fall.  He has history of diabetes, hypertension and has had multiple strokes recently.  On exam it appears that he has had some worsening of the left side, and that primarily led to the fall.  MRI ordered to see if there is reactivation of the old stroke or new areas of infarct.  MRI confirms a new ischemic stroke. Results discussed with the patient.  He has always wanted to go home since he arrived here.  After we discussed the results, he still wants to go home.  He states that no matter what, he is going to still get strokes, and he rather be at home.  He understands that part of the goal for admission is to reduce the risk of future strokes and also to get him some physical therapy to ensure that he can regain his strength.  After much deliberation with the patient and his wife, he continues to want to leave AGAINST MEDICAL ADVICE  Patient wants to leave against medical advice. Patient understands that his/her actions will lead to inadequate medical workup, and that he/she is at risk of complications of missed diagnosis, which includes morbidity and mortality.  Alternative options discussed -awaiting for neurology consultation while in the ER. Opportunity to change mind given. Discussion witnessed by patient's wife. Patient is demonstrating good capacity to make decision. Patient understands  that he/she needs to return to the ER immediately if his/her symptoms get worse.   Final Clinical Impression(s) / ED Diagnoses Final diagnoses:  Acute stroke due to ischemia [facility])    Rx / DC Orders ED Discharge Orders     None        Derwood Kaplan, MD 08/31/20 [phone]

## 2020-08-30 NOTE — ED Triage Notes (Signed)
Patient states that he took one of his meds yesterday and today couldn't walk when he awoke this am. Unsure what pill it was and denies pain. Reports face was also numb when he awoke this am. Patient reports went to bed last night at 11pm. Patient has left sided deficits from previous stroke. NAD. No drift, no new weakness

## 2020-08-30 NOTE — ED Provider Notes (Cosign Needed)
Emergency Medicine Provider Triage Evaluation Note  Frank Luna , a 60 y.o. male  was evaluated in triage.  Pt complains of patient presents with weakness, states last night he went to bed feeling just fine but this morning he woke up and felt very weak, was unable to hold himself up, states when he stands up he falls back onto his back.  He denies headaches, change in vision, paresthesias in his upper and or lower extremities.  Patient states he has had a stroke in the past, has left-sided weakness.  Has no other complaints at this time.  Denies chest pain or shortness of breath..  Review of Systems  Positive: Weakness, fall Negative: Chest pain shortness of breath  Physical Exam  BP (!) 143/81 (BP Location: Left Arm)   Pulse 80   Temp 98.1 F (36.7 C) (Oral)   Resp 15   SpO2 95%  Gen:   Awake, no distress   Resp:  Normal effort  MSK:   Moves extremities without difficulty  Other:  Patient is noted weakness in his left upper arm, no facial asymmetry, able to follow commands, no difficulty with word finding.   Medical Decision Making  Medically screening exam initiated at 4:02 PM.  Appropriate orders placed.  Hazim Treadway was informed that the remainder of the evaluation will be completed by another provider, this initial triage assessment does not replace that evaluation, and the importance of remaining in the ED until their evaluation is complete.  Patient presents with weakness, outside stroke window, lab work and imaging have been ordered.  Patient will need further work-up.   Carroll Sage, PA-C 08/30/20 [phone]

## 2020-08-30 NOTE — ED Notes (Signed)
Pt endorses no complaints at this time.

## 2020-08-31 ENCOUNTER — Ambulatory Visit: Payer: Medicaid Other | Admitting: General Surgery

## 2020-08-31 LAB — SARS CORONAVIRUS 2 (TAT 6-24 HRS): SARS Coronavirus 2: NEGATIVE

## 2020-08-31 NOTE — Discharge Instructions (Addendum)
You had new area of stroke based on the MRI. We wanted to admit him to the hospital, you preferred going home.  Please call the neurologist at the number provided to set up an appointment.  Return to the ER if your symptoms get worse.

## 2020-11-15 ENCOUNTER — Encounter: Payer: Self-pay | Admitting: Adult Health

## 2020-11-15 ENCOUNTER — Ambulatory Visit: Payer: Medicaid Other | Admitting: Adult Health

## 2020-11-15 NOTE — Progress Notes (Deleted)
[facility] [address] [place]. [place] [address] [phone]       STROKE FOLLOW UP NOTE  [name] Date of Birth:  February 11, 1961 Medical Record Number:  [id number]   Reason for Referral: stroke follow up    SUBJECTIVE:   CHIEF COMPLAINT:  No chief complaint on file.   HPI:   Today, 11/15/2020, Frank Luna returns for 66-month stroke follow-up.  He presented to ED on 08/30/2020 after a fall and on exam concern of worsening left-sided weakness possibly contributing to fall upon awakening as well as left facial numbness.  MRI showed new strokes: Acute posterior limb of right internal capsule infarct, acute subcortical posterior right frontal parietal region infarct and subacute posterior left centrum semiovale infarct.  He unfortunately left AMA prior to further work-up.  Since that time, ***.   Compliant on aspirin and atorvastatin 40 mg daily without side effects.  Blood pressure today ***.  Glucose levels ***.      MR BRAIN WO CONTRAST 08/30/2020 IMPRESSION: 1. 1.3 cm acute ischemic nonhemorrhagic infarct involving the posterior limb of the right internal capsule. 2. Additional 1.6 cm acute ischemic nonhemorrhagic infarct involving the overlying subcortical posterior right frontoparietal region. 3. Interval evolution of previously identified ischemic infarcts involving the right corona radiata and subcortical left frontal lobe, now late subacute to chronic in appearance. Additional evolving 6 mm subacute ischemic infarct at the posterior left centrum semi ovale, subacute in nature, but new as compared to most recent MRI from 06/25/2020. 4. Underlying age-related cerebral atrophy with advanced chronic microvascular ischemic disease.    History provided for reference purposes only Initial visit 08/15/2020 JM: Frank Luna is being seen for hospital follow-up accompanied by his wife, Frank Luna. He has been doing well since discharge with improvement of  left-sided weakness and speech.  He has not been seen by any type of therapies has been doing his own exercises at home.  He is ambulating without assistive device and did have 1 fall since returning home thankfully without injury but no recent falls.  Denies residual cognitive difficulties but wife is concerned regarding mood changes and he is quick to become irritable.  He was started on Seroquel during hospitalization with stabilization but he is not currently taking.  Denies new stroke/TIA symptoms.  Completed 3 weeks DAPT and remains on aspirin alone as well as atorvastatin 40 mg daily without associated side effects.  Blood pressure today initially elevated but on recheck 138/62.  Compliant on amlodipine and lisinopril.  He reports recently being started on metoprolol by PCP but had difficulty tolerating due to nausea and decreased extremity therefore he self discontinued with resolution of symptoms once stopping.  Routinely monitors glucose levels at home which have also been stable.  He does endorse continued tobacco use reporting less than 1 pack/day.  No further concerns at this time.  Stroke admission 07/17/2020 Frank Luna is a 60 y.o. male with history of DM2, HTN, thrombocytopenia, prior stroke in 2017 with minimal to no residual symptoms, smoker ~1pack per day who presented to [facility] ED on 06/17/2020 with left sided weakness.  Personally reviewed hospitalization pertinent progress notes, lab work and imaging summary provided.  Pt initially signed out AMA but returned and an MRI revealed a 1 cm R corona radiata infarct likely due to small vessel disease.  LDL 37.  A1c 8.3.  Recommended DAPT for 3 weeks and aspirin alone - on aspirin 325 mg daily and clopidogrel 75 mg daily PTA.  HTN stable on high end.  Continued home dose atorvastatin 40 mg daily.  Current tobacco use with smoking cessation counseling provided.  Prior stroke history 01/2016 with MRI showing left CR small infarct, old left BG  bilateral thalamic infarcts.  Residual deficits of left-sided weakness and slurred speech and discharged to CIR  Stroke: Rt CR infarct likely due to likely due to small vessel disease.  CT head - No acute findings. No intracranial mass, hemorrhage or edema.  MRI head - 1 cm acute infarction in the corona radiata white matter on the right.  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   2D Echo - EF 60 - 65%. No cardiac source of emboli identified.  Sars Corona Virus 2 - negative LDL - 37 HgbA1c - 8.3 UDS - negative VTE prophylaxis - lovenox aspirin 325 mg daily and clopidogrel 75 mg daily prior to admission, now on aspirin 81 mg daily and plavix 75 DAPT for 3 weeks and then ASA alone. Patient will be counseled to be compliant with his antithrombotic medications Ongoing aggressive stroke risk factor management Therapy recommendations:  CIR Disposition:  d/c to CIR 06/21/2020-07/04/2020      ROS:   14 system review of systems performed and negative with exception of those listed in HPI  PMH:  Past Medical History:  Diagnosis Date   Diabetes (HCC)    Hypertension    Stroke (HCC)    Thrombocytopenia (HCC)     PSH: No past surgical history on file.  Social History:  Social History   Socioeconomic History   Marital status: Married    Spouse name: Not on file   Number of children: Not on file   Years of education: Not on file   Highest education level: Not on file  Occupational History   Not on file  Tobacco Use   Smoking status: Every Day    Packs/day: 1.00    Years: 45.00    Pack years: 45.00    Types: Cigarettes   Smokeless tobacco: Never  Vaping Use   Vaping Use: Never used  Substance and Sexual Activity   Alcohol use: No   Drug use: No   Sexual activity: Not on file  Other Topics Concern   Not on file  Social History Narrative    Not on file   Social Determinants of Health   Financial Resource Strain: Not on file  Food Insecurity: Not on file  Transportation Needs: Not on file  Physical Activity: Not on file  Stress: Not on file  Social Connections: Not on file  Intimate Partner Violence: Not on file    Family History:  Family History  Problem Relation Age of Onset   Stroke Paternal Great-grandfather     Medications:   Current Outpatient Medications on File Prior to Visit  Medication Sig Dispense Refill   acetaminophen (TYLENOL) 325 MG tablet Take 2 tablets (650 mg total) by mouth every 6 (six) hours as needed for mild pain, headache or moderate pain (or Fever >/= 101).     amLODipine (NORVASC) 5 MG tablet Take 1 tablet (5 mg total) by mouth daily. 30 tablet 0   aspirin EC 81 MG EC tablet Take 1 tablet (81 mg total) by mouth daily. Swallow whole. 30 tablet 0   atorvastatin (LIPITOR) 40 MG tablet Take 1 tablet (40 mg total) by mouth daily at 6  PM. 90 tablet 3   cholecalciferol (VITAMIN D) 25 MCG tablet Take 1 tablet (1,000 Units total) by mouth daily. 30 tablet 0   dapagliflozin propanediol (FARXIGA) 10 MG TABS tablet Take 1 tablet (10 mg total) by mouth daily. 30 tablet 0   glipiZIDE (GLUCOTROL) 5 MG tablet Take 1 tablet (5 mg total) by mouth 2 (two) times daily before a meal. 60 tablet 0   insulin glargine (LANTUS) 100 UNIT/ML Solostar Pen Inject 12 Units into the skin at bedtime. 15 mL 11   lisinopril (ZESTRIL) 40 MG tablet Take 1 tablet (40 mg total) by mouth daily. 30 tablet 0   nicotine (NICODERM CQ - DOSED IN MG/24 HOURS) 21 mg/24hr patch NicoDerm patch 21 mg daily x1 week and 14 mg patch daily x3 weeks then 7 mg patch daily x3 weeks and stop 28 patch 0   QUEtiapine (SEROQUEL) 25 MG tablet Take 0.5 tablets (12.5 mg total) by mouth at bedtime. 45 tablet 3   No current facility-administered medications on file prior to visit.    Allergies:   Allergies  Allergen Reactions   Penicillins     Unknown  reaction      OBJECTIVE:  Physical Exam  There were no vitals filed for this visit.  There is no height or weight on file to calculate BMI. No results found.   Post stroke PHQ 2/9 Depression screen PHQ 2/9 08/15/2020  Decreased Interest 1  Down, Depressed, Hopeless 0  PHQ - 2 Score 1     General: well developed, well nourished, pleasant middle-age Caucasian male, seated, in no evident distress Head: head normocephalic and atraumatic.   Neck: supple with no carotid or supraclavicular bruits Cardiovascular: regular rate and rhythm, no murmurs Musculoskeletal: no deformity Skin:  no rash/petichiae Vascular:  Normal pulses all extremities   Neurologic Exam Mental Status: Awake and fully alert.  Mild dysarthria.  No evidence of aphasia.  Oriented to place and time. Recent and remote memory intact. Attention span, concentration and fund of knowledge appropriate during visit. Mood and affect appropriate during our visit but was verbally aggressive towards CMA.  Cranial Nerves: Fundoscopic exam reveals sharp disc margins. Pupils equal, briskly reactive to light. Extraocular movements full without nystagmus. Visual fields full to confrontation. Hearing intact. Facial sensation intact.  Mild left lower facial weakness.  Tongue, and palate moves normally and symmetrically.  Motor: Normal strength, bulk and tone on right side.  LUE: 4+/5 with decreased grip strength and swelling and increased tone.  LLE: 4+/5 hip flexor and ankle dorsiflexion Sensory.: intact to touch , pinprick , position and vibratory sensation.  Coordination: Rapid alternating movements normal in all extremities except left hand. Finger-to-nose performed accurately RUE and heel-to-shin performed accurately bilaterally. Gait and Station: Arises from chair without difficulty. Stance is normal. Gait demonstrates decreased stride length and step height LLE and mild imbalance without use of assistive device.  Tandem walk and  heel toe not attempted Reflexes: 1+ and symmetric. Toes downgoing.     NIHSS  2 Modified Rankin  3      ASSESSMENT: Frank Luna is a 60 y.o. year old male with right CR infarct on 07/17/2020 secondary to small vessel disease after presenting with left-sided weakness.  Represented to ED on 08/30/2020 after follow-up on awakening with worsening left-sided weakness and left facial numbness with MRI revealing acute posterior limb of right internal capsule infarct, acute subcortical posterior right frontal parietal region infarct and subacute posterior left centrum semiovale infarct. He unfortunately  left AMA prior to further testing.  Vascular risk factors include HTN, HLD, DM, history of stroke 01/2016 and tobacco use.      PLAN:  Recurrent strokes (as above) Imperative to complete further stroke work-up including CTA head/neck, lipid panel and A1c.  No indication for repeat echo as completed 06/2020.  Due to bilateral nature, recommend cardiac monitoring to rule out atrial fibrillation as potential cause of recurrent strokes. Would recommend transitioning from aspirin to Plavix as he had additional strokes despite aspirin compliance R CR stroke :  Residual deficit: Left hemiparesis, dysarthria and mood changes.  He declines interest in formal therapies and wishes to continue his current home exercise routine.  He was advised to contact office in the future if he wishes to proceed with therapy.  Advised to restart Seroquel 12.5 mg nightly as prescribed during CIR for mood stabilization.  Advised to reschedule follow-up with Dr. Allena Katz PMR Initiate clopidogrel 75 mg daily  and continue atorvastatin 40 mg daily for secondary stroke prevention.   Discussed secondary stroke prevention measures and importance of close PCP follow up for aggressive stroke risk factor management  HTN: BP goal <130/90.  Stable on current regimen per PCP HLD: LDL goal <70.  Prior LDL 37 -plan to repeat today in setting of  recurrent stroke-continue atorvastatin 40 mg daily per PCP.  DMII: A1c goal<7.0.  Prior A1c 8.3.  Repeat today in setting of recurrent stroke.  Monitor by PCP Tobacco use: Discussed importance of complete tobacco cessation especially in setting of recurrent strokes.  He verbalized understanding and does have plans on quitting    Follow up in 3 months or call earlier if needed   CC:  [facility] provider: Dr. Pearlean Brownie PCP: Graylin Shiver [place]    I spent 46 minutes of face-to-face and non-face-to-face time with patient and wife.  This included previsit chart review including recent hospitalization, lab review, study review, order entry, electronic health record documentation, and prolonged patient and wife education and discussion regarding recent stroke including etiology, secondary stroke prevention measures and aggressive stroke risk factor management, residual deficits and typical recovery time, and answered all questions to patient satisfaction   Ihor Austin, AGNP-BC  [facility] [address] [place], [place] [address]  Phone [phone] Fax [phone] Note: This document was prepared with digital dictation and possible smart phrase technology. Any transcriptional errors that result from this process are unintentional.

## 2021-03-06 ENCOUNTER — Telehealth: Payer: Self-pay | Admitting: "Endocrinology

## 2021-03-06 NOTE — Telephone Encounter (Signed)
Attemped pt several times for referral. Patient did not respond. Closed Referral

## 2021-10-03 NOTE — PMR Pre-admission (Shared)
PMR Admission Coordinator Pre-Admission Assessment  Patient: Frank Luna is an 61 y.o., male MRN: [id number] DOB: 01/24/1961 Height: 5' 7" (1.702 m) Weight: 92.1 kg  Insurance Information HMO:     PPO:      PCP:      IPA:      80/20:      OTHER:  [place]      Policy#: [id number]      Subscriber: Pt, CM Name: ***      Phone#: ***     Fax#: *** Pre-Cert#: [id number].        Employer:  Benefits:  Phone #:      Name:  : Effective: 04/18/2021- 07/18/2025     Deduct:       Out of Pocket Max:       Life Max:  CIR:       SNF:  Outpatient:      Co-Pay:  Home Health:       Co-Pay:  DME:      Co-Pay:  Providers:  SECONDARY: none      Policy#:       Phone#:   Development worker, community:       Phone#:   The Therapist, art Information Summary" for patients in Inpatient Rehabilitation Facilities with attached "Privacy Act [place] Records" was provided and verbally reviewed with: n/a  Emergency Contact Information Contact Information     Name Relation Home Work [place] Spouse [phone]  [phone]   Jason Nest Daughter   [phone]       Current Medical History  Patient Admitting Diagnosis: CVA  History of Present Illness: Pt. Is a 61 year old male who was admitted 8/8-8/9 to [facility] for CVA (left AMA) and presented back to [facility] from hom later than day on 09/26/21 He originally presented with R sided weakness and inability to walk. His CT showed small evolving infarct in the posterior limb of the right internal capsule as well as a right thalamic interval infarct. He was hypokalemic, hypocalcemic, with questionable diluted CMP. On readmission, he demonstrated worsening of symptoms. MRI 8/10 consistent with acute perforator infarcts of the L basal ganglia, evolving subacute infarct in the R corona radiata, remote lacunar infarcts, and chronic microvascular ischemic disease and cerebral atrophy. No evidence  of  hemorrhagic  infarct. Pt. Presented outside TPA window, so did not receive any. Pt.'s stay complicated by uncontrolled DM2 (self discontinued meds), HTN, HLD, and dehydration. Pt. Seen by PT/OT/SLP and they recommended CIR to assist return to PLOF.     Patient's medical record from [facility]  has been reviewed by the rehabilitation admission coordinator and physician.  Past Medical History  Past Medical History:  Diagnosis Date   Diabetes ([place])    Hypertension    Stroke ([place])    Thrombocytopenia ([place])     Has the patient had major surgery during 100 days prior to admission? No  Family History   family history includes Stroke in his paternal great-grandfather.  Current Medications  Current Outpatient Medications:    acetaminophen (TYLENOL) 325 MG tablet, Take 2 tablets (650 mg total) by mouth every 6 (six) hours as needed for mild pain, headache or moderate pain (or Fever >/= 101)., Disp: , Rfl:    amLODipine (NORVASC) 5 MG tablet, Take 1 tablet (5 mg total) by mouth daily., Disp: 30 tablet, Rfl: 0   aspirin EC 81 MG EC tablet, Take 1 tablet (81 mg total) by  mouth daily. Swallow whole., Disp: 30 tablet, Rfl: 0   atorvastatin (LIPITOR) 40 MG tablet, Take 1 tablet (40 mg total) by mouth daily at 6 PM., Disp: 90 tablet, Rfl: 3   cholecalciferol (VITAMIN D) 25 MCG tablet, Take 1 tablet (1,000 Units total) by mouth daily., Disp: 30 tablet, Rfl: 0   dapagliflozin propanediol (FARXIGA) 10 MG TABS tablet, Take 1 tablet (10 mg total) by mouth daily., Disp: 30 tablet, Rfl: 0   glipiZIDE (GLUCOTROL) 5 MG tablet, Take 1 tablet (5 mg total) by mouth 2 (two) times daily before a meal., Disp: 60 tablet, Rfl: 0   insulin glargine (LANTUS) 100 UNIT/ML Solostar Pen, Inject 12 Units into the skin at bedtime., Disp: 15 mL, Rfl: 11   lisinopril (ZESTRIL) 40 MG tablet, Take 1 tablet (40 mg total) by mouth daily., Disp: 30 tablet, Rfl: 0   nicotine (NICODERM CQ - DOSED IN MG/24 HOURS) 21 mg/24hr  patch, NicoDerm patch 21 mg daily x1 week and 14 mg patch daily x3 weeks then 7 mg patch daily x3 weeks and stop, Disp: 28 patch, Rfl: 0   QUEtiapine (SEROQUEL) 25 MG tablet, Take 0.5 tablets (12.5 mg total) by mouth at bedtime., Disp: 45 tablet, Rfl: 3  Patients Current Diet: Diet **  Precautions / Restrictions Precautions: Fall Weight Bearing Restrictions: No    Has the patient had 2 or more falls or a fall with injury in the past year? Yes  Prior Activity Level Limited Community (1-2x/wk): Went out for appts   Prior Functional Level Self Care: Did the patient need help bathing, dressing, using the toilet or eating? Needed some help  Indoor Mobility: Did the patient need assistance with walking from room to room (with or without device)? Needed some help  Stairs: Did the patient need assistance with internal or external stairs (with or without device)? Independent  Functional Cognition: Did the patient need help planning regular tasks such as shopping or remembering to take medications? Needed some help  Patient Information Are you of Hispanic, Latino/a,or Spanish origin?: A. No, not of Hispanic, Latino/a, or Spanish origin What is your race?: A. White Do you need or want an interpreter to communicate with a doctor or health care staff?: 0. No  Patient's Response To:  Health Literacy and Transportation Is the patient able to respond to health literacy and transportation needs?: Yes Health Literacy - How often do you need to have someone help you when you read instructions, pamphlets, or other written material from your doctor or pharmacy?: Sometimes In the past 12 months, has lack of transportation kept you from medical appointments or from getting medications?: No In the past 12 months, has lack of transportation kept you from meetings, work, or from getting things needed for daily living?: No  Home Assistive Devices / Equipment Shower chair with back   Prior Device Use:  Indicate devices/aids used by the patient prior to current illness, exacerbation or injury? None of the above   Prior Functional Level Current Functional Level  Bed Mobility  Independent Mod assist   Transfers  Independent  Max assist   Mobility - Walk/Wheelchair  Min assist  Max assist   Upper Body Dressing  Min assist  Min assist   Lower Body Dressing  Min assist  Max assist   Grooming  Independent  Min assist   Eating/Drinking  Independent  Max assist   Toilet Transfer  Independent  Max assist   Bladder Continence   Continent  Incontinent     Bowel Management  Continent  Incontinent   Stair Climbing  Min assist  Other   Communication  Independent  Mod A   Memory  Min A Mod A     Special Needs/ Care Considerations Special service needs none   Previous Home Environment (from acute therapy documentation) Living Arrangements: Parent  Lives With: Other (Comment) Available Help at Discharge: Family; Available 24 hours/day; Personal care attendant Type of Home: House Home Layout: One level Home Access: Stairs to enter Entrance Stairs-Rails: None Entrance Stairs-Number of Steps: 4 Bathroom Shower/Tub: Chiropodist: Handicapped height Bathroom Accessibility: Yes How Accessible: Accessible via walker [place]: No   Discharge Living Setting Plans for Discharge Living Setting: Patient's home Type of Home at Discharge: House Discharge Home Layout: One level Discharge Home Access: Stairs to enter Entrance Stairs-Rails: None Entrance Stairs-Number of Steps: 4 Discharge Bathroom Shower/Tub: Tub/shower unit Discharge Bathroom Toilet: Handicapped height Discharge Bathroom Accessibility: No How Accessible: Accessible via walker Does the patient have any problems obtaining your medications?: No   Social/Family/Support Systems Patient Roles: Parent Contact Information: [phone] Anticipated Caregiver: Krishiv Sandler  (mother Ability/Limitations of Caregiver: Can provide supervision to light assist (Agency w3 stated they can have personal care attendant to Pt.'s home (covered by his Medicaid) within 2-3 weeks and are working on it currently. Contact there is [employer] [phone]) Caregiver Availability: 24/7 Discharge Plan Discussed with Primary Caregiver: Yes Is Caregiver In Agreement with Plan?: Yes Does Caregiver/Family have Issues with Lodging/Transportation while Pt is in Rehab?: No   Goals Patient/Family Goal for Rehab: PT/OT/SLP Min A Expected length of stay: 18-21 days Pt/Family Agrees to Admission and willing to participate: Yes Program Orientation Provided & Reviewed with Pt/Caregiver Including Roles  & Responsibilities: Yes   Decrease burden of Care through IP rehab admission: not anticipated   Possible need for SNF placement upon discharge: not anticipated  Patient Condition: I have reviewed medical records from [facility], spoken with CM, and patient. I met with patient at the bedside for inpatient rehabilitation assessment.  Patient will benefit from ongoing PT, OT, and SLP, can actively participate in 3 hours of therapy a day 5 days of the week, and can make measurable gains during the admission.  Patient will also benefit from the coordinated team approach during an Inpatient Acute Rehabilitation admission.  The patient will receive intensive therapy as well as Rehabilitation physician, nursing, social worker, and care management interventions.  Due to bladder management, bowel management, safety, skin/wound care, disease management, medication administration, pain management, and patient education the patient requires 24 hour a day rehabilitation nursing.  The patient is currently max A  with mobility and basic ADLs.  Discharge setting and therapy post discharge at home with home health is anticipated.  Patient has agreed to participate in the Acute Inpatient Rehabilitation  Program and will admit {Time; today/tomorrow:10263}.  Preadmission Screen Completed By:  Genella Mech, 10/03/2021 3:16 PM ______________________________________________________________________   Discussed status with Dr. Marland Kitchen on *** at *** and received approval for admission today.  Admission Coordinator:  Genella Mech, CCC-SLP, time Marland KitchenSudie Grumbling ***   Assessment/Plan: Diagnosis: Does the need for close, 24 hr/day Medical supervision in concert with the patient's rehab needs make it unreasonable for this patient to be served in a less intensive setting? {yes_no_potentially:3041433} Co-Morbidities requiring supervision/potential complications: *** Due to {due GE:9528413}, does the patient require 24 hr/day rehab nursing? {yes_no_potentially:3041433} Does the patient require coordinated care of a physician, rehab nurse, PT, OT,  and SLP to address physical and functional deficits in the context of the above medical diagnosis(es)? {yes_no_potentially:3041433} Addressing deficits in the following areas: {deficits:3041436} Can the patient actively participate in an intensive therapy program of at least 3 hrs of therapy 5 days a week? {yes_no_potentially:3041433} The potential for patient to make measurable gains while on inpatient rehab is {potential:3041437} Anticipated functional outcomes upon discharge from inpatient rehab: {functional outcomes:304600100} PT, {functional outcomes:304600100} OT, {functional outcomes:304600100} SLP Estimated rehab length of stay to reach the above functional goals is: *** Anticipated discharge destination: {anticipated dc setting:21604} 10. Overall Rehab/Functional Prognosis: {potential:3041437}  MD Signature ***

## 2022-01-01 ENCOUNTER — Inpatient Hospital Stay (HOSPITAL_COMMUNITY)
Admission: EM | Admit: 2022-01-01 | Discharge: 2022-01-08 | DRG: 871 | Disposition: A | Discharge status: Hospice | Payer: Medicaid Other | Source: Skilled Nursing Facility | Attending: Family Medicine | Admitting: Family Medicine

## 2022-01-01 ENCOUNTER — Emergency Department (HOSPITAL_COMMUNITY): Payer: Medicaid Other

## 2022-01-01 ENCOUNTER — Encounter (HOSPITAL_COMMUNITY): Payer: Self-pay

## 2022-01-01 ENCOUNTER — Inpatient Hospital Stay (HOSPITAL_COMMUNITY): Payer: Medicaid Other

## 2022-01-01 DIAGNOSIS — N390 Urinary tract infection, site not specified: Secondary | ICD-10-CM | POA: Diagnosis not present

## 2022-01-01 DIAGNOSIS — Z7982 Long term (current) use of aspirin: Secondary | ICD-10-CM | POA: Diagnosis not present

## 2022-01-01 DIAGNOSIS — R6521 Severe sepsis with septic shock: Secondary | ICD-10-CM | POA: Diagnosis present

## 2022-01-01 DIAGNOSIS — A419 Sepsis, unspecified organism: Secondary | ICD-10-CM | POA: Diagnosis not present

## 2022-01-01 DIAGNOSIS — Z515 Encounter for palliative care: Secondary | ICD-10-CM | POA: Diagnosis not present

## 2022-01-01 DIAGNOSIS — R627 Adult failure to thrive: Secondary | ICD-10-CM | POA: Diagnosis not present

## 2022-01-01 DIAGNOSIS — I69319 Unspecified symptoms and signs involving cognitive functions following cerebral infarction: Secondary | ICD-10-CM

## 2022-01-01 DIAGNOSIS — E876 Hypokalemia: Secondary | ICD-10-CM | POA: Diagnosis not present

## 2022-01-01 DIAGNOSIS — Z7189 Other specified counseling: Secondary | ICD-10-CM | POA: Diagnosis not present

## 2022-01-01 DIAGNOSIS — R6510 Systemic inflammatory response syndrome (SIRS) of non-infectious origin without acute organ dysfunction: Secondary | ICD-10-CM | POA: Diagnosis not present

## 2022-01-01 DIAGNOSIS — I69354 Hemiplegia and hemiparesis following cerebral infarction affecting left non-dominant side: Secondary | ICD-10-CM | POA: Diagnosis not present

## 2022-01-01 DIAGNOSIS — R7989 Other specified abnormal findings of blood chemistry: Secondary | ICD-10-CM | POA: Diagnosis present

## 2022-01-01 DIAGNOSIS — E1159 Type 2 diabetes mellitus with other circulatory complications: Secondary | ICD-10-CM | POA: Diagnosis not present

## 2022-01-01 DIAGNOSIS — E1165 Type 2 diabetes mellitus with hyperglycemia: Secondary | ICD-10-CM | POA: Diagnosis not present

## 2022-01-01 DIAGNOSIS — F17210 Nicotine dependence, cigarettes, uncomplicated: Secondary | ICD-10-CM | POA: Diagnosis present

## 2022-01-01 DIAGNOSIS — R4189 Other symptoms and signs involving cognitive functions and awareness: Secondary | ICD-10-CM | POA: Diagnosis present

## 2022-01-01 DIAGNOSIS — R54 Age-related physical debility: Secondary | ICD-10-CM | POA: Diagnosis present

## 2022-01-01 DIAGNOSIS — E870 Hyperosmolality and hypernatremia: Secondary | ICD-10-CM | POA: Diagnosis not present

## 2022-01-01 DIAGNOSIS — R0602 Shortness of breath: Secondary | ICD-10-CM | POA: Diagnosis present

## 2022-01-01 DIAGNOSIS — E860 Dehydration: Secondary | ICD-10-CM | POA: Diagnosis present

## 2022-01-01 DIAGNOSIS — I10 Essential (primary) hypertension: Secondary | ICD-10-CM | POA: Diagnosis present

## 2022-01-01 DIAGNOSIS — I639 Cerebral infarction, unspecified: Secondary | ICD-10-CM | POA: Diagnosis present

## 2022-01-01 DIAGNOSIS — J189 Pneumonia, unspecified organism: Secondary | ICD-10-CM | POA: Diagnosis not present

## 2022-01-01 DIAGNOSIS — I6300 Cerebral infarction due to thrombosis of unspecified precerebral artery: Secondary | ICD-10-CM | POA: Diagnosis not present

## 2022-01-01 DIAGNOSIS — Z1152 Encounter for screening for COVID-19: Secondary | ICD-10-CM | POA: Diagnosis not present

## 2022-01-01 DIAGNOSIS — N179 Acute kidney failure, unspecified: Secondary | ICD-10-CM | POA: Diagnosis present

## 2022-01-01 DIAGNOSIS — R6520 Severe sepsis without septic shock: Secondary | ICD-10-CM | POA: Diagnosis present

## 2022-01-01 DIAGNOSIS — Z880 Allergy status to penicillin: Secondary | ICD-10-CM

## 2022-01-01 DIAGNOSIS — E119 Type 2 diabetes mellitus without complications: Secondary | ICD-10-CM

## 2022-01-01 DIAGNOSIS — Z794 Long term (current) use of insulin: Secondary | ICD-10-CM | POA: Diagnosis not present

## 2022-01-01 DIAGNOSIS — I2489 Other forms of acute ischemic heart disease: Secondary | ICD-10-CM | POA: Diagnosis not present

## 2022-01-01 DIAGNOSIS — G9341 Metabolic encephalopathy: Secondary | ICD-10-CM | POA: Diagnosis not present

## 2022-01-01 DIAGNOSIS — Z79899 Other long term (current) drug therapy: Secondary | ICD-10-CM

## 2022-01-01 DIAGNOSIS — Z66 Do not resuscitate: Secondary | ICD-10-CM | POA: Diagnosis present

## 2022-01-01 DIAGNOSIS — N19 Unspecified kidney failure: Secondary | ICD-10-CM

## 2022-01-01 DIAGNOSIS — R1310 Dysphagia, unspecified: Secondary | ICD-10-CM | POA: Diagnosis not present

## 2022-01-01 DIAGNOSIS — J9601 Acute respiratory failure with hypoxia: Secondary | ICD-10-CM | POA: Diagnosis not present

## 2022-01-01 DIAGNOSIS — Z7984 Long term (current) use of oral hypoglycemic drugs: Secondary | ICD-10-CM

## 2022-01-01 LAB — CBC WITH DIFFERENTIAL/PLATELET
Abs Immature Granulocytes: 0.07 10*3/uL (ref 0.00–0.07)
Basophils Absolute: 0 10*3/uL (ref 0.0–0.1)
Basophils Relative: 0 %
Eosinophils Absolute: 0.3 10*3/uL (ref 0.0–0.5)
Eosinophils Relative: 2 %
HCT: 52.2 % — ABNORMAL HIGH (ref 39.0–52.0)
Hemoglobin: 17.3 g/dL — ABNORMAL HIGH (ref 13.0–17.0)
Immature Granulocytes: 0 %
Lymphocytes Relative: 4 %
Lymphs Abs: 0.7 10*3/uL (ref 0.7–4.0)
MCH: 30.9 pg (ref 26.0–34.0)
MCHC: 33.1 g/dL (ref 30.0–36.0)
MCV: 93.2 fL (ref 80.0–100.0)
Monocytes Absolute: 1.4 10*3/uL — ABNORMAL HIGH (ref 0.1–1.0)
Monocytes Relative: 7 %
Neutro Abs: 16.9 10*3/uL — ABNORMAL HIGH (ref 1.7–7.7)
Neutrophils Relative %: 87 %
Platelets: 163 10*3/uL (ref 150–400)
RBC: 5.6 MIL/uL (ref 4.22–5.81)
RDW: 14.6 % (ref 11.5–15.5)
WBC: 19.3 10*3/uL — ABNORMAL HIGH (ref 4.0–10.5)
nRBC: 0 % (ref 0.0–0.2)

## 2022-01-01 LAB — BASIC METABOLIC PANEL
Anion gap: 12 (ref 5–15)
BUN: 69 mg/dL — ABNORMAL HIGH (ref 8–23)
CO2: 21 mmol/L — ABNORMAL LOW (ref 22–32)
Calcium: 10.1 mg/dL (ref 8.9–10.3)
Chloride: 114 mmol/L — ABNORMAL HIGH (ref 98–111)
Creatinine, Ser: 1.98 mg/dL — ABNORMAL HIGH (ref 0.61–1.24)
GFR, Estimated: 38 mL/min — ABNORMAL LOW (ref >60)
Glucose, Bld: 216 mg/dL — ABNORMAL HIGH (ref 70–99)
Potassium: 5 mmol/L (ref 3.5–5.1)
Sodium: 147 mmol/L — ABNORMAL HIGH (ref 135–145)

## 2022-01-01 LAB — BRAIN NATRIURETIC PEPTIDE: B Natriuretic Peptide: 131 pg/mL — ABNORMAL HIGH (ref 0.0–100.0)

## 2022-01-01 LAB — PROCALCITONIN: Procalcitonin: 0.24 ng/mL

## 2022-01-01 LAB — RESP PANEL BY RT-PCR (FLU A&B, COVID) ARPGX2
Influenza A by PCR: NEGATIVE
Influenza B by PCR: NEGATIVE
SARS Coronavirus 2 by RT PCR: NEGATIVE

## 2022-01-01 LAB — BLOOD GAS, VENOUS
Acid-Base Excess: 1.9 mmol/L (ref 0.0–2.0)
Bicarbonate: 24.4 mmol/L (ref 20.0–28.0)
Drawn by: 66297
O2 Saturation: 89.4 %
Patient temperature: 37.3
pCO2, Ven: 32 mmHg — ABNORMAL LOW (ref 44–60)
pH, Ven: 7.49 — ABNORMAL HIGH (ref 7.25–7.43)
pO2, Ven: 59 mmHg — ABNORMAL HIGH (ref 32–45)

## 2022-01-01 LAB — MAGNESIUM: Magnesium: 2.6 mg/dL — ABNORMAL HIGH (ref 1.7–2.4)

## 2022-01-01 LAB — TROPONIN I (HIGH SENSITIVITY)
Troponin I (High Sensitivity): 34 ng/L — ABNORMAL HIGH (ref <18)
Troponin I (High Sensitivity): 35 ng/L — ABNORMAL HIGH (ref <18)

## 2022-01-01 LAB — LACTIC ACID, PLASMA: Lactic Acid, Venous: 5.4 mmol/L (ref 0.5–1.9)

## 2022-01-01 MED ORDER — SODIUM CHLORIDE 0.9 % IV BOLUS
1000.0000 mL | Freq: Once | INTRAVENOUS | Status: AC
  Administered 2022-01-01: 1000 mL via INTRAVENOUS

## 2022-01-01 MED ORDER — SODIUM CHLORIDE 0.9 % IV SOLN
2.0000 g | Freq: Once | INTRAVENOUS | Status: AC
  Administered 2022-01-02: 2 g via INTRAVENOUS
  Filled 2022-01-01: qty 12.5

## 2022-01-01 MED ORDER — METRONIDAZOLE 500 MG/100ML IV SOLN
500.0000 mg | Freq: Two times a day (BID) | INTRAVENOUS | Status: DC
  Administered 2022-01-01: 500 mg via INTRAVENOUS
  Filled 2022-01-01 (×2): qty 100

## 2022-01-01 MED ORDER — VANCOMYCIN HCL 2000 MG/400ML IV SOLN
2000.0000 mg | Freq: Once | INTRAVENOUS | Status: AC
  Administered 2022-01-02: 2000 mg via INTRAVENOUS
  Filled 2022-01-01: qty 400

## 2022-01-01 MED ORDER — LACTATED RINGERS IV BOLUS
1000.0000 mL | Freq: Once | INTRAVENOUS | Status: AC
  Administered 2022-01-02: 1000 mL via INTRAVENOUS

## 2022-01-01 NOTE — ED Notes (Signed)
Pt repositioned in bed, bed locked in lowest position

## 2022-01-01 NOTE — Assessment & Plan Note (Signed)
Mild troponin elevation -troponin 34 > 35.  EKG with new diffuse T wave inversions in lead to 3 aVF V3 through V6.  Likely demand ischemia from possible severe sepsis, respiratory failure and dehydration. -Repeat EKG in a.m.

## 2022-01-01 NOTE — ED Triage Notes (Signed)
Pt brought in by [facility] for respiratory distress. O2 sats 87% on 15L.

## 2022-01-01 NOTE — H&P (Addendum)
History and Physical    Frank Luna [id number] DOB: 1960-05-12 DOA: 01/01/2022  PCP: [name] Chapman   Patient coming from: [place]  I have personally briefly reviewed patient's old medical records in [place]  Chief Complaint: Difficulty breathing, altered mental status  HPI: Frank Luna is a 61 y.o. male with medical history significant for diabetes mellitus, stroke, hypertension. Patient was brought to the ED from nursing home with reports of difficulty breathing, and cough.  Over the past few days, patient has had decreased oral intake, and decreased responsiveness.  ED provider talked to patient's mother who is his legal guardian, confirmed the above history, and that patient is DNR/DNI.  At baseline, patient cannot ambulate, or feed himself, and is dependent for ADLs. They tried to feed him yesterday, but he would not swallow. On my evaluation, patient is unable to provide history, somnolent/nearly obtunded, eyes are open, with some flexion movement of his extremities to sternal rub.  ED Course: O2 sats 87% on 15 L, he was placed on nonrebreather sats greater than 90%.  Tmax 99.1.  Respiratory rate 29-40.  Heart rate 110-120. Leukocytosis of 19.3. Creatinine elevated 1.98.  Sodium 147.  ABG shows pH of 7.49, PCO2 of 32, PO2 of 59.  Troponin 34 > 85. Chest X-ray showing bronchitic changes. Head CT showing remote infarcts that are new from prior exam.  Negative for acute abnormality. 2 L bolus given hospitalist admit.  Review of Systems: Unable to assess due to altered mental status.  Past Medical History:  Diagnosis Date   Diabetes ([place])    Hypertension    Stroke ([place])    Thrombocytopenia ([place])     History reviewed. No pertinent surgical history.   reports that he has been smoking cigarettes. He has a 45.00 pack-year smoking history. He has never used smokeless tobacco. He reports that he does not drink alcohol and does not use  drugs.  Allergies  Allergen Reactions   Penicillins     Unknown reaction    Family History  Problem Relation Age of Onset   Stroke Paternal Great-grandfather    Prior to Admission medications   Medication Sig Start Date End Date Taking? Authorizing Provider  acetaminophen (TYLENOL) 325 MG tablet Take 2 tablets (650 mg total) by mouth every 6 (six) hours as needed for mild pain, headache or moderate pain (or Fever >/= 101). 07/03/20   Angiulli, Lavon Paganini, PA-C  amLODipine (NORVASC) 5 MG tablet Take 1 tablet (5 mg total) by mouth daily. 07/03/20   Angiulli, Lavon Paganini, PA-C  aspirin EC 81 MG EC tablet Take 1 tablet (81 mg total) by mouth daily. Swallow whole. 06/21/20   Armando Reichert, MD  atorvastatin (LIPITOR) 40 MG tablet Take 1 tablet (40 mg total) by mouth daily at 6 PM. 07/03/20   Angiulli, Lavon Paganini, PA-C  cholecalciferol (VITAMIN D) 25 MCG tablet Take 1 tablet (1,000 Units total) by mouth daily. 07/04/20   Angiulli, Lavon Paganini, PA-C  dapagliflozin propanediol (FARXIGA) 10 MG TABS tablet Take 1 tablet (10 mg total) by mouth daily. 07/03/20   Angiulli, Lavon Paganini, PA-C  glipiZIDE (GLUCOTROL) 5 MG tablet Take 1 tablet (5 mg total) by mouth 2 (two) times daily before a meal. 07/04/20   Angiulli, Lavon Paganini, PA-C  insulin glargine (LANTUS) 100 UNIT/ML Solostar Pen Inject 12 Units into the skin at bedtime. 07/03/20   Angiulli, Lavon Paganini, PA-C  lisinopril (ZESTRIL) 40 MG tablet Take 1 tablet (40 mg total) by mouth  daily. 07/03/20   Angiulli, Mcarthur Rossetti, PA-C  nicotine (NICODERM CQ - DOSED IN MG/24 HOURS) 21 mg/24hr patch NicoDerm patch 21 mg daily x1 week and 14 mg patch daily x3 weeks then 7 mg patch daily x3 weeks and stop 07/03/20   Angiulli, Mcarthur Rossetti, PA-C  QUEtiapine (SEROQUEL) 25 MG tablet Take 0.5 tablets (12.5 mg total) by mouth at bedtime. 08/15/20   Ihor Austin, NP    Physical Exam: Vitals:   01/01/22 2000 01/01/22 2030 01/01/22 2100 01/01/22 2130  BP: (!) 127/91 (!) 142/87 127/86 117/79  Pulse: (!)  110 (!) 110 (!) 110 (!) 106  Resp: (!) 30 (!) 44 (!) 33 (!) 28  Temp:      TempSrc:      SpO2: 90% 90% 93% 97%    Constitutional: Eyes open, not tracking, somnolent/near obtundation Vitals:   01/01/22 2000 01/01/22 2030 01/01/22 2100 01/01/22 2130  BP: (!) 127/91 (!) 142/87 127/86 117/79  Pulse: (!) 110 (!) 110 (!) 110 (!) 106  Resp: (!) 30 (!) 44 (!) 33 (!) 28  Temp:      TempSrc:      SpO2: 90% 90% 93% 97%   Eyes: PERRL, lids and conjunctivae normal ENMT: Mucous membranes are dry   Neck: normal, supple, no masses, no thyromegaly Respiratory: Secretions in the airway auscultated, do not appreciate wheezing or rhonchi, increased work of breathing, use of accessory muscles,  Cardiovascular: Tachycardic, regular rate and rhythm, no murmurs / rubs / gallops. No extremity edema.  Extremities warm. Abdomen: no tenderness, no masses palpated. No hepatosplenomegaly. Bowel sounds positive.  Musculoskeletal: no clubbing / cyanosis. No joint deformity upper and lower extremities. Good ROM, no contractures. Normal muscle tone.  Skin: no rashes, lesions, ulcers. No induration Neurologic: No apparent cranial nerve abnormality.  Flexion movement of bilateral upper extremities to pain, movement of the bilateral lower extremities to stimulation of soles Psychiatric: . Eyes open, not tracking, somnolent/near obtundation  Labs on Admission: I have personally reviewed following labs and imaging studies  Basic Metabolic Panel: Recent Labs  Lab 01/01/22 1802  NA 147*  K 5.0  CL 114*  CO2 21*  GLUCOSE 216*  BUN 69*  CREATININE 1.98*  CALCIUM 10.1  MG 2.6*   Radiological Exams on Admission: CT Head Wo Contrast  Result Date: 01/01/2022 CLINICAL DATA:  Delirium EXAM: CT HEAD WITHOUT CONTRAST TECHNIQUE: Contiguous axial images were obtained from the base of the skull through the vertex without intravenous contrast. RADIATION DOSE REDUCTION: This exam was performed according to the departmental  dose-optimization program which includes automated exposure control, adjustment of the mA and/or kV according to patient size and/or use of iterative reconstruction technique. COMPARISON:  08/30/2020 FINDINGS: Brain: Normal anatomic configuration. Parenchymal volume loss is commensurate with the patient's age. Moderate periventricular white matter changes are present likely reflecting the sequela of small vessel ischemia. Remote lacunar infarcts within the carina radiology and thalami bilaterally are identified with remote appearing infarcts within the left thalamus and posterior left corona radiata new in the interval since prior examination. No abnormal intra or extra-axial mass lesion or fluid collection. No abnormal mass effect or midline shift. No evidence of acute intracranial hemorrhage or infarct. Ventricular size is normal. Cerebellum unremarkable. Vascular: No asymmetric hyperdense vasculature at the skull base. Skull: Intact Sinuses/Orbits: Paranasal sinuses are clear. Orbits are unremarkable. Other: Fluid is seen within the left mastoid air cells without superimposed osseous erosion. Middle ear cavities and right mastoid air cells are clear. IMPRESSION:  1. No acute intracranial hemorrhage or infarct. 2. Moderate senescent change. 3. Remote appearing infarcts within the left thalamus and posterior left corona radiata, new in the interval since prior examination. 4. Left mastoid effusion. Electronically Signed   By: Fidela Salisbury M.D.   On: 01/01/2022 21:22   DG Chest Port 1 View  Result Date: 01/01/2022 CLINICAL DATA:  Respiratory distress EXAM: PORTABLE CHEST 1 VIEW COMPARISON:  02/14/2018 FINDINGS: Mild bronchitic changes. No acute airspace disease, pleural effusion or pneumothorax. Stable cardiomediastinal silhouette. Aortic atherosclerosis IMPRESSION: Bronchitic changes without focal airspace disease Electronically Signed   By: Donavan Foil M.D.   On: 01/01/2022 18:40    EKG: Independently  reviewed.  Sinus tachycardia rate 125.  QTc 460.  New diffuse T wave inversions in lead to 3 aVF V3 through V6.  Assessment/Plan Principal Problem:   Acute respiratory failure with hypoxia (HCC) Active Problems:   AKI (acute kidney injury) ([place])   Acute metabolic encephalopathy   SIRS (systemic inflammatory response syndrome) (HCC)   Essential hypertension   Cognitive impairment   Diabetes ([place])   Stroke (HCC)   Elevated troponin  Assessment and Plan: * Acute respiratory failure with hypoxia (HCC) O2 sats down to 87% on 15 L cannula, currently on nonrebreather 15 L and sats ranging from 90 to 97%.  VBG shows pH of 7.49, PCO2 of 32.  Chest x-ray showing bronchitic changes.  -Obtain Stat CT chest without contrast - If etiology hypoxia still not determined on CT, proceed with VQ scan and start heparin drip -Patient is DNR/DNI -Admit to stepdown, hold off on BiPAP PCO2 is not elevated, and with altered mental status -Respiratory protocol   AKI (acute kidney injury) ([place]) Cr elevated at 1.98, BUN of 69.  Baseline creatinine ~ 0.8 - 1.  Likely from dehydration, in the setting of SIRS and ACE inh use. -2 L bolus given, continue LR 100cc/hr x 1 day -Hold lisinopril  Acute metabolic encephalopathy Encephalopathy likely multifactorial from dehydration, severe sepsis, hypoxia.  Head CT showing remote infarcts, new since prior exam.  No acute abnormality.  At this time he is somnolent/near obtundation - NPO. -IV antibiotics IV fluids. -Hold Seroquel  SIRS (systemic inflammatory response syndrome) (HCC) Meeting criteria, with tachycardia heart rate 110-120, tachypnea respiratory 29-40, leukocytosis of 19.3.  Evidence of endorgan dysfunction -acute hypoxic respiratory failure and acute metabolic encephalopathy.  Source likely respiratory, patient may have aspirated. -Obtain blood cultures, urine cultures - Pro-calcitonin -Check UA,  -Check lactic acid -Start broad-spectrum antibiotics IV  vancomycin cefepime and metronidazole -If CT negative, proceed with VQ scan in a.m. and start heparin drip  Elevated troponin  Mild troponin elevation -troponin 34 > 35.  EKG with new diffuse T wave inversions in lead to 3 aVF V3 through V6.  Likely demand ischemia from possible severe sepsis, respiratory failure and dehydration. -Repeat EKG in a.m.  Stroke [facility]) History of prior strokes.  Nursing home resident.  CT without acute abnormality today, showing remote infarcts that are new since prior exam.   -Hold statins, aspirin while n.p.o.  Diabetes ([place]) - HgbA1c - SSI- S q4h -Hold home Lantus, glipizide, Farxiga  Essential hypertension Blood pressure 1 17-142. -N.p.o. for now, AKI - hold lisinopril and Norvasc   DVT prophylaxis: Heparin Code Status: DNR- PINK MOST form at bedside.  ED provider also confirmed with patient's mother. Family Communication: None at bedside. Disposition Plan: > 2 days Consults called: None Admission status: Inpatient, stepdown  I certify that at the  point of admission it is my clinical judgment that the patient will require inpatient hospital care spanning beyond 2 midnights from the point of admission due to high intensity of service, high risk for further deterioration and high frequency of surveillance required.   Author: Bethena Roys, MD 01/01/2022 10:38 PM  For on call review [email or web address].

## 2022-01-01 NOTE — ED Provider Notes (Signed)
[facility] EMERGENCY DEPARTMENT Provider Note   CSN: [id number] Arrival date & time: 01/01/22  1744     History {Add pertinent medical, surgical, social history, OB history to HPI:1} Chief Complaint  Patient presents with   Shortness of Breath    Frank Luna is a 61 y.o. male.  Patient as above with significant medical history as below, including DM, HTN, CVA DNR/DNI who presents to the ED with complaint of diabetes. Level 5 caveat resp distress/cva  D/w pt's mother who is legal guardian at [phone], confirmed DNR/DNI.  Mother reports patient has been having some breathing problems over the past couple days, coughing, felt fevers but apparently nursing home did not have thermometer and cannot check his temperature.  Minimal p.o. intake past 24 hours.  Reduced responsiveness.  Does not wear supplemental oxygen at baseline.    Past Medical History:  Diagnosis Date   Diabetes (HCC)    Hypertension    Stroke (HCC)    Thrombocytopenia (HCC)     History reviewed. No pertinent surgical history.   The history is provided by the patient. No language interpreter was used.  Shortness of Breath      Home Medications Prior to Admission medications   Medication Sig Start Date End Date Taking? Authorizing Provider  acetaminophen (TYLENOL) 325 MG tablet Take 2 tablets (650 mg total) by mouth every 6 (six) hours as needed for mild pain, headache or moderate pain (or Fever >/= 101). 07/03/20   Angiulli, Mcarthur Rossetti, PA-C  amLODipine (NORVASC) 5 MG tablet Take 1 tablet (5 mg total) by mouth daily. 07/03/20   Angiulli, Mcarthur Rossetti, PA-C  aspirin EC 81 MG EC tablet Take 1 tablet (81 mg total) by mouth daily. Swallow whole. 06/21/20   Karsten Ro, MD  atorvastatin (LIPITOR) 40 MG tablet Take 1 tablet (40 mg total) by mouth daily at 6 PM. 07/03/20   Angiulli, Mcarthur Rossetti, PA-C  cholecalciferol (VITAMIN D) 25 MCG tablet Take 1 tablet (1,000 Units total) by mouth daily. 07/04/20   Angiulli, Mcarthur Rossetti,  PA-C  dapagliflozin propanediol (FARXIGA) 10 MG TABS tablet Take 1 tablet (10 mg total) by mouth daily. 07/03/20   Angiulli, Mcarthur Rossetti, PA-C  glipiZIDE (GLUCOTROL) 5 MG tablet Take 1 tablet (5 mg total) by mouth 2 (two) times daily before a meal. 07/04/20   Angiulli, Mcarthur Rossetti, PA-C  insulin glargine (LANTUS) 100 UNIT/ML Solostar Pen Inject 12 Units into the skin at bedtime. 07/03/20   Angiulli, Mcarthur Rossetti, PA-C  lisinopril (ZESTRIL) 40 MG tablet Take 1 tablet (40 mg total) by mouth daily. 07/03/20   Angiulli, Mcarthur Rossetti, PA-C  nicotine (NICODERM CQ - DOSED IN MG/24 HOURS) 21 mg/24hr patch NicoDerm patch 21 mg daily x1 week and 14 mg patch daily x3 weeks then 7 mg patch daily x3 weeks and stop 07/03/20   Angiulli, Mcarthur Rossetti, PA-C  QUEtiapine (SEROQUEL) 25 MG tablet Take 0.5 tablets (12.5 mg total) by mouth at bedtime. 08/15/20   Ihor Austin, NP      Allergies    Penicillins    Review of Systems   Review of Systems  Unable to perform ROS: Severe respiratory distress  Respiratory:  Positive for shortness of breath.     Physical Exam Updated Vital Signs BP 127/86   Pulse (!) 110   Temp 99.1 F (37.3 C) (Axillary)   Resp (!) 33   SpO2 93%  Physical Exam Vitals and nursing note reviewed. Exam conducted with a chaperone present.  Constitutional:  General: He is in acute distress.     Appearance: He is ill-appearing.  HENT:     Head: Normocephalic and atraumatic. No right periorbital erythema or left periorbital erythema.     Jaw: There is normal jaw occlusion.     Right Ear: External ear normal.     Left Ear: External ear normal.     Mouth/Throat:     Mouth: Mucous membranes are dry.     Pharynx: No oropharyngeal exudate.  Cardiovascular:     Rate and Rhythm: Regular rhythm. Tachycardia present.     Pulses: Normal pulses.     Heart sounds:     No friction rub. No gallop. No S3 or S4 sounds.  Pulmonary:     Effort: Tachypnea, accessory muscle usage and respiratory distress present.      Breath sounds: Decreased breath sounds present.  Abdominal:     General: Abdomen is flat.     Palpations: Abdomen is soft.     Tenderness: There is no abdominal tenderness. There is no guarding or rebound.  Musculoskeletal:     Cervical back: Normal range of motion. No rigidity.     Right lower leg: No edema.     Left lower leg: No edema.  Skin:    General: Skin is warm and dry.     Capillary Refill: Capillary refill takes 2 to 3 seconds.  Neurological:     Mental Status: He is lethargic.     GCS: GCS eye subscore is 4. GCS verbal subscore is 1. GCS motor subscore is 4.  Psychiatric:        Behavior: Behavior is cooperative.     ED Results / Procedures / Treatments   Labs (all labs ordered are listed, but only abnormal results are displayed) Labs Reviewed  BASIC METABOLIC PANEL - Abnormal; Notable for the following components:      Result Value   Sodium 147 (*)    Chloride 114 (*)    CO2 21 (*)    Glucose, Bld 216 (*)    BUN 69 (*)    Creatinine, Ser 1.98 (*)    GFR, Estimated 38 (*)    All other components within normal limits  BRAIN NATRIURETIC PEPTIDE - Abnormal; Notable for the following components:   B Natriuretic Peptide 131.0 (*)    All other components within normal limits  BLOOD GAS, VENOUS - Abnormal; Notable for the following components:   pH, Ven 7.49 (*)    pCO2, Ven 32 (*)    pO2, Ven 59 (*)    All other components within normal limits  MAGNESIUM - Abnormal; Notable for the following components:   Magnesium 2.6 (*)    All other components within normal limits  TROPONIN I (HIGH SENSITIVITY) - Abnormal; Notable for the following components:   Troponin I (High Sensitivity) 34 (*)    All other components within normal limits  TROPONIN I (HIGH SENSITIVITY) - Abnormal; Notable for the following components:   Troponin I (High Sensitivity) 35 (*)    All other components within normal limits  RESP PANEL BY RT-PCR (FLU A&B, COVID) [id number]     EKG None  Radiology CT Head Wo Contrast  Result Date: 01/01/2022 CLINICAL DATA:  Delirium EXAM: CT HEAD WITHOUT CONTRAST TECHNIQUE: Contiguous axial images were obtained from the base of the skull through the vertex without intravenous contrast. RADIATION DOSE REDUCTION: This exam was performed according to the departmental dose-optimization program which includes automated exposure control, adjustment of the  mA and/or kV according to patient size and/or use of iterative reconstruction technique. COMPARISON:  08/30/2020 FINDINGS: Brain: Normal anatomic configuration. Parenchymal volume loss is commensurate with the patient's age. Moderate periventricular white matter changes are present likely reflecting the sequela of small vessel ischemia. Remote lacunar infarcts within the carina radiology and thalami bilaterally are identified with remote appearing infarcts within the left thalamus and posterior left corona radiata new in the interval since prior examination. No abnormal intra or extra-axial mass lesion or fluid collection. No abnormal mass effect or midline shift. No evidence of acute intracranial hemorrhage or infarct. Ventricular size is normal. Cerebellum unremarkable. Vascular: No asymmetric hyperdense vasculature at the skull base. Skull: Intact Sinuses/Orbits: Paranasal sinuses are clear. Orbits are unremarkable. Other: Fluid is seen within the left mastoid air cells without superimposed osseous erosion. Middle ear cavities and right mastoid air cells are clear. IMPRESSION: 1. No acute intracranial hemorrhage or infarct. 2. Moderate senescent change. 3. Remote appearing infarcts within the left thalamus and posterior left corona radiata, new in the interval since prior examination. 4. Left mastoid effusion. Electronically Signed   By: Helyn NumbersAshesh  Parikh M.D.   On: 01/01/2022 21:22   DG Chest Port 1 View  Result Date: 01/01/2022 CLINICAL DATA:  Respiratory distress EXAM: PORTABLE CHEST 1 VIEW  COMPARISON:  02/14/2018 FINDINGS: Mild bronchitic changes. No acute airspace disease, pleural effusion or pneumothorax. Stable cardiomediastinal silhouette. Aortic atherosclerosis IMPRESSION: Bronchitic changes without focal airspace disease Electronically Signed   By: Jasmine PangKim  Fujinaga M.D.   On: 01/01/2022 18:40    Procedures Procedures  {Document cardiac monitor, telemetry assessment procedure when appropriate:1}  Medications Ordered in ED Medications  sodium chloride 0.9 % bolus 1,000 mL (has no administration in time range)  sodium chloride 0.9 % bolus 1,000 mL (1,000 mLs Intravenous New Bag/Given 01/01/22 2025)    ED Course/ Medical Decision Making/ A&P Clinical Course as of 01/01/22 2128  Tue Jan 01, 2022  1920 Creatinine(!): 1.98 aki [SG]    Clinical Course User Index [SG] Sloan LeiterGray, Kaysea Raya A, DO                           Medical Decision Making Amount and/or Complexity of Data Reviewed Labs: ordered. Decision-making details documented in ED Course. Radiology: ordered.   This patient presents to the ED with chief complaint(s) of dib with pertinent past medical history of cva, DNR/DNI which further complicates the presenting complaint. The complaint involves an extensive differential diagnosis and also carries with it a high risk of complications and morbidity.    The differential diagnosis includes but not limited to In my evaluation of this patient's dyspnea my DDx includes, but is not limited to, pneumonia, pulmonary embolism, pneumothorax, pulmonary edema, metabolic acidosis, asthma, COPD, cardiac cause, anemia, anxiety, etc.  . Serious etiologies were considered.   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 Minimal  improvement in deficits while at rehab at [facility].    Independent labs interpretation:  The following labs were independently interpreted:  VBG is stable Rvp neg Mg is mild elev at 2.6 BMP with hypernatremia 147, Cr is 1.98, worse from baseline around 1.0; BUN elevated 69, baseline wnl.  Trop is also elev at 34, EKG without stemi, favor this is  likely demand ischemia in setting of respiratory distress.  Delta 35,  Independent visualization of imaging: - I independently visualized the following imaging with scope of interpretation limited to determining acute life threatening conditions related to emergency care: CXR, CTH , which revealed bronchial changes, no overt pna or edema; CTH with remote infarcts, new from prior   Cardiac monitoring was reviewed and interpreted by myself which shows sinus tachy  Treatment and Reassessment: Patient on 15 L nonrebreather, O2 sats 90%. Do not feel BiPAP would be appropriate given patient's mental status, will continue NRB Resp status improved, pt more awake with the NRB Give IVF given dehydration, uremia   Consultation: - Consulted or discussed management/test interpretation w/ external professional: na  Consideration for admission or further workup: Admission was considered   Unfortunate 61 year old male with history of multiple prior CVAs from nursing facility here with respiratory distress.  Symptoms ongoing the past 3 days.  Per mother patient has not been acting normally for around 3 days, reduced responsiveness, poor p.o. intake.  Patient found to have significant dehydration, uremia, hypernatremia.  CT imaging also concerning for further stroke not demonstrated on prior imaging.  His mental status has improved nonrebreather, he does appear very dry.  Continue IV fluids.  Concern for mental status change secondary to dehydration, uremia.  Recommend admission.  Family is agreeable.  I did confirm CODE STATUS with mother   Social  Determinants of health: Social History   Tobacco Use   Smoking status: Every Day    Packs/day: 1.00    Years: 45.00    Total pack years: 45.00    Types: Cigarettes   Smokeless tobacco: Never  Vaping Use   Vaping Use: Never used  Substance Use Topics   Alcohol use: No   Drug use: No      {Document critical care time when appropriate:1} {Document review of labs and clinical decision tools ie heart score, Chads2Vasc2 etc:1}  {Document your independent review of radiology images, and any outside records:1} {Document your discussion with family members, caretakers, and with consultants:1} {Document social determinants of health affecting pt's care:1} {Document your decision making why or why not admission, treatments were needed:1} Final Clinical Impression(s) / ED Diagnoses Final diagnoses:  Acute hypoxic respiratory failure (HCC)  Uremia  AKI (acute kidney injury) (HCC)    Rx / DC Orders ED Discharge Orders     None

## 2022-01-01 NOTE — Assessment & Plan Note (Signed)
Blood pressure 1 17-142. -N.p.o. for now, AKI - hold lisinopril and Norvasc

## 2022-01-01 NOTE — Assessment & Plan Note (Signed)
O2 sats down to 87% on 15 L cannula, currently on nonrebreather 15 L and sats ranging from 90 to 97%.  VBG shows pH of 7.49, PCO2 of 32.  Chest x-ray showing bronchitic changes.  -Obtain Stat CT chest without contrast - If etiology hypoxia still not determined on CT, proceed with VQ scan and start heparin drip -Patient is DNR/DNI -Admit to stepdown, hold off on BiPAP PCO2 is not elevated, and with altered mental status -Respiratory protocol

## 2022-01-01 NOTE — ED Notes (Signed)
Patient transported to CT 

## 2022-01-01 NOTE — ED Notes (Signed)
Update given to RN @[facility]

## 2022-01-01 NOTE — Assessment & Plan Note (Addendum)
Meeting criteria, with tachycardia heart rate 110-120, tachypnea respiratory 29-40, leukocytosis of 19.3.  Evidence of endorgan dysfunction -acute hypoxic respiratory failure and acute metabolic encephalopathy.  Source likely respiratory, patient may have aspirated. -Obtain blood cultures, urine cultures - Pro-calcitonin -Check UA,  -Check lactic acid -Start broad-spectrum antibiotics IV vancomycin cefepime and metronidazole -If CT negative, proceed with VQ scan in a.m. and start heparin drip

## 2022-01-01 NOTE — Assessment & Plan Note (Deleted)
Meeting severe sepsis criteria, with tachycardia heart rate 110-120, tachypnea respiratory 29-40, leukocytosis of 19.3.  Evidence of endorgan dysfunction -acute hypoxic respiratory failure and acute metabolic encephalopathy.  Source likely respiratory, patient may have aspirated. -Obtain blood cultures, urine cultures - Pro-calcitonin -Check UA,  -Check lactic acid -Start broad-spectrum antibiotics IV vancomycin cefepime and metronidazole

## 2022-01-01 NOTE — Assessment & Plan Note (Addendum)
-   HgbA1c - SSI- S q4h -Hold home Lantus, glipizide, Marcelline Deist

## 2022-01-01 NOTE — Assessment & Plan Note (Addendum)
Encephalopathy likely multifactorial from dehydration, severe sepsis, hypoxia.  Head CT showing remote infarcts, new since prior exam.  No acute abnormality.  At this time he is somnolent/near obtundation - NPO. -IV antibiotics IV fluids. -Hold Seroquel

## 2022-01-01 NOTE — Assessment & Plan Note (Addendum)
Cr elevated at 1.98, BUN of 69.  Baseline creatinine ~ 0.8 - 1.  Likely from dehydration, in the setting of SIRS and ACE inh use. -2 L bolus given, continue LR 100cc/hr x 1 day -Hold lisinopril

## 2022-01-01 NOTE — Assessment & Plan Note (Signed)
History of prior strokes.  Nursing home resident.  CT without acute abnormality today, showing remote infarcts that are new since prior exam.   -Hold statins, aspirin while n.p.o.

## 2022-01-02 ENCOUNTER — Other Ambulatory Visit: Payer: Self-pay

## 2022-01-02 DIAGNOSIS — J9601 Acute respiratory failure with hypoxia: Secondary | ICD-10-CM | POA: Diagnosis not present

## 2022-01-02 LAB — BASIC METABOLIC PANEL
Anion gap: 16 — ABNORMAL HIGH (ref 5–15)
BUN: 68 mg/dL — ABNORMAL HIGH (ref 8–23)
CO2: 21 mmol/L — ABNORMAL LOW (ref 22–32)
Calcium: 9.4 mg/dL (ref 8.9–10.3)
Chloride: 115 mmol/L — ABNORMAL HIGH (ref 98–111)
Creatinine, Ser: 2.02 mg/dL — ABNORMAL HIGH (ref 0.61–1.24)
GFR, Estimated: 37 mL/min — ABNORMAL LOW (ref >60)
Glucose, Bld: 155 mg/dL — ABNORMAL HIGH (ref 70–99)
Potassium: 4.6 mmol/L (ref 3.5–5.1)
Sodium: 152 mmol/L — ABNORMAL HIGH (ref 135–145)

## 2022-01-02 LAB — MRSA NEXT GEN BY PCR, NASAL: MRSA by PCR Next Gen: NOT DETECTED

## 2022-01-02 LAB — RENAL FUNCTION PANEL
Albumin: 2.8 g/dL — ABNORMAL LOW (ref 3.5–5.0)
Anion gap: 7 (ref 5–15)
BUN: 62 mg/dL — ABNORMAL HIGH (ref 8–23)
CO2: 19 mmol/L — ABNORMAL LOW (ref 22–32)
Calcium: 8.4 mg/dL — ABNORMAL LOW (ref 8.9–10.3)
Chloride: 118 mmol/L — ABNORMAL HIGH (ref 98–111)
Creatinine, Ser: 1.53 mg/dL — ABNORMAL HIGH (ref 0.61–1.24)
GFR, Estimated: 51 mL/min — ABNORMAL LOW (ref >60)
Glucose, Bld: 128 mg/dL — ABNORMAL HIGH (ref 70–99)
Phosphorus: 2.7 mg/dL (ref 2.5–4.6)
Potassium: 3.2 mmol/L — ABNORMAL LOW (ref 3.5–5.1)
Sodium: 144 mmol/L (ref 135–145)

## 2022-01-02 LAB — HEMOGLOBIN A1C
Hgb A1c MFr Bld: 6.5 % — ABNORMAL HIGH (ref 4.8–5.6)
Mean Plasma Glucose: 139.85 mg/dL

## 2022-01-02 LAB — GLUCOSE, CAPILLARY
Glucose-Capillary: 107 mg/dL — ABNORMAL HIGH (ref 70–99)
Glucose-Capillary: 125 mg/dL — ABNORMAL HIGH (ref 70–99)
Glucose-Capillary: 144 mg/dL — ABNORMAL HIGH (ref 70–99)
Glucose-Capillary: 156 mg/dL — ABNORMAL HIGH (ref 70–99)
Glucose-Capillary: 212 mg/dL — ABNORMAL HIGH (ref 70–99)
Glucose-Capillary: 86 mg/dL (ref 70–99)

## 2022-01-02 LAB — CBC
HCT: 46.7 % (ref 39.0–52.0)
Hemoglobin: 14.7 g/dL (ref 13.0–17.0)
MCH: 30.2 pg (ref 26.0–34.0)
MCHC: 31.5 g/dL (ref 30.0–36.0)
MCV: 95.9 fL (ref 80.0–100.0)
Platelets: 126 10*3/uL — ABNORMAL LOW (ref 150–400)
RBC: 4.87 MIL/uL (ref 4.22–5.81)
RDW: 14.6 % (ref 11.5–15.5)
WBC: 18.1 10*3/uL — ABNORMAL HIGH (ref 4.0–10.5)
nRBC: 0 % (ref 0.0–0.2)

## 2022-01-02 LAB — LACTIC ACID, PLASMA: Lactic Acid, Venous: 6.1 mmol/L (ref 0.5–1.9)

## 2022-01-02 LAB — PROCALCITONIN: Procalcitonin: 0.32 ng/mL

## 2022-01-02 LAB — HIV ANTIBODY (ROUTINE TESTING W REFLEX): HIV Screen 4th Generation wRfx: NONREACTIVE

## 2022-01-02 MED ORDER — ONDANSETRON HCL 4 MG/2ML IJ SOLN: 4.0000 mg | Freq: Four times a day (QID) | INTRAMUSCULAR | Status: DC | PRN

## 2022-01-02 MED ORDER — SODIUM CHLORIDE 0.45 % IV SOLN
INTRAVENOUS | Status: DC
  Filled 2022-01-02 (×5): qty 75

## 2022-01-02 MED ORDER — SODIUM CHLORIDE 0.45 % IV SOLN: INTRAVENOUS | Status: AC

## 2022-01-02 MED ORDER — INSULIN ASPART 100 UNIT/ML IJ SOLN
0.0000 [IU] | INTRAMUSCULAR | Status: DC
  Administered 2022-01-02: 1 [IU] via SUBCUTANEOUS
  Administered 2022-01-02: 3 [IU] via SUBCUTANEOUS
  Administered 2022-01-02: 2 [IU] via SUBCUTANEOUS
  Administered 2022-01-02 – 2022-01-07 (×8): 1 [IU] via SUBCUTANEOUS

## 2022-01-02 MED ORDER — SODIUM CHLORIDE 0.9 % IV SOLN
2.0000 g | Freq: Two times a day (BID) | INTRAVENOUS | Status: DC
  Administered 2022-01-02 (×2): 2 g via INTRAVENOUS
  Filled 2022-01-02 (×3): qty 12.5

## 2022-01-02 MED ORDER — VANCOMYCIN HCL 750 MG/150ML IV SOLN
750.0000 mg | INTRAVENOUS | Status: DC
  Administered 2022-01-03: 750 mg via INTRAVENOUS
  Filled 2022-01-02: qty 150

## 2022-01-02 MED ORDER — ACETAMINOPHEN 650 MG RE SUPP: 650.0000 mg | Freq: Four times a day (QID) | RECTAL | Status: DC | PRN

## 2022-01-02 MED ORDER — VANCOMYCIN VARIABLE DOSE PER UNSTABLE RENAL FUNCTION (PHARMACIST DOSING): Status: DC

## 2022-01-02 MED ORDER — ACETAMINOPHEN 325 MG PO TABS: 650.0000 mg | ORAL_TABLET | Freq: Four times a day (QID) | ORAL | Status: DC | PRN

## 2022-01-02 MED ORDER — ONDANSETRON HCL 4 MG PO TABS: 4.0000 mg | ORAL_TABLET | Freq: Four times a day (QID) | ORAL | Status: DC | PRN

## 2022-01-02 MED ORDER — LACTATED RINGERS IV SOLN: INTRAVENOUS | Status: DC

## 2022-01-02 MED ORDER — SODIUM CHLORIDE 0.9 % IV SOLN
500.0000 mg | INTRAVENOUS | Status: DC
  Administered 2022-01-02 – 2022-01-07 (×6): 500 mg via INTRAVENOUS
  Filled 2022-01-02 (×6): qty 5

## 2022-01-02 MED ORDER — ATORVASTATIN CALCIUM 40 MG PO TABS
40.0000 mg | ORAL_TABLET | Freq: Every day | ORAL | Status: DC
  Administered 2022-01-02: 40 mg via ORAL
  Filled 2022-01-02 (×4): qty 1

## 2022-01-02 MED ORDER — ALBUTEROL SULFATE (2.5 MG/3ML) 0.083% IN NEBU: 2.5000 mg | INHALATION_SOLUTION | RESPIRATORY_TRACT | Status: DC | PRN

## 2022-01-02 MED ORDER — CHLORHEXIDINE GLUCONATE CLOTH 2 % EX PADS
6.0000 | MEDICATED_PAD | Freq: Every day | CUTANEOUS | Status: DC
  Administered 2022-01-02 – 2022-01-07 (×7): 6 via TOPICAL

## 2022-01-02 MED ORDER — ASPIRIN 81 MG PO TBEC
81.0000 mg | DELAYED_RELEASE_TABLET | Freq: Every day | ORAL | Status: DC
  Administered 2022-01-02: 81 mg via ORAL
  Filled 2022-01-02 (×4): qty 1

## 2022-01-02 MED ORDER — HEPARIN SODIUM (PORCINE) 5000 UNIT/ML IJ SOLN
5000.0000 [IU] | Freq: Three times a day (TID) | INTRAMUSCULAR | Status: DC
  Administered 2022-01-02 – 2022-01-07 (×17): 5000 [IU] via SUBCUTANEOUS
  Filled 2022-01-02 (×17): qty 1

## 2022-01-02 NOTE — TOC Initial Note (Addendum)
Transition of Care [facility]) - Initial/Assessment Note    Patient Details  Name: Frank Luna MRN: [id number] Date of Birth: [birth year]  [facility]) CM/SW Contact:    Salome Arnt, [place] Phone Number: 01/02/2022, 8:29 AM  Clinical Narrative: Pt admitted due to acute respiratory failure with hypoxia. Assessment completed with pt's wife as pt oriented to self only. Pt's wife states she is unsure exactly how long pt has been a resident at [facility]. She requests return when medically stable. LCSW left voicemail for Debbie at [facility] requesting return call. TOC will continue to follow.     Update: Per Jackelyn Poling, pt is long term and okay to return.  Expected Discharge Plan: Skilled Nursing Facility Barriers to Discharge: Continued Medical Work up   Patient Goals and CMS Choice Patient states their goals for this hospitalization and ongoing recovery are:: return to SNF   Choice offered to / list presented to : Spouse  Expected Discharge Plan and Services Expected Discharge Plan: [place] In-house Referral: Clinical Social Work   Post Acute Care Choice: Resumption of Svcs/PTA Provider Living arrangements for the past 2 months: [place]                                      Prior Living Arrangements/Services Living arrangements for the past 2 months: [place] Lives with:: Facility Resident Patient language and need for interpreter reviewed:: Yes Do you feel safe going back to the place where you live?: Yes      Need for Family Participation in Patient Care: Yes (Comment)     Criminal Activity/Legal Involvement Pertinent to Current Situation/Hospitalization: No - Comment as needed  Activities of Daily Living Home Assistive Devices/Equipment: None ADL Screening (condition at time of admission) Patient's cognitive ability adequate to safely complete daily activities?: Yes Is the patient deaf or have  difficulty hearing?: No Does the patient have difficulty seeing, even when wearing glasses/contacts?: No Does the patient have difficulty concentrating, remembering, or making decisions?: Yes Patient able to express need for assistance with ADLs?: No Does the patient have difficulty dressing or bathing?: Yes Independently performs ADLs?: No Does the patient have difficulty walking or climbing stairs?: No Weakness of Legs: None Weakness of Arms/Hands: None  Permission Sought/Granted         Permission granted to share info w AGENCY: [place] granted to share info w Relationship: SNF     Emotional Assessment   Attitude/Demeanor/Rapport: Unable to Assess Affect (typically observed): Unable to Assess   Alcohol / Substance Use: Not Applicable Psych Involvement: No (comment)  Admission diagnosis:  Uremia [N19] Acute respiratory failure with hypoxia (HCC) [J96.01] AKI (acute kidney injury) ([place]) [N17.9] Acute hypoxic respiratory failure ([place]) [J96.01] Patient Active Problem List   Diagnosis Date Noted   Acute respiratory failure with hypoxia ([place]) 01/01/2022   AKI (acute kidney injury) ([place]) [id number]   Acute metabolic encephalopathy [id number]   Elevated troponin 01/01/2022   SIRS (systemic inflammatory response syndrome) ([place]) 01/01/2022   Labile blood glucose    Benign essential HTN    Noncompliance    Monoparesis of arm (HCC)    Hypoalbuminemia due to protein-calorie malnutrition ([place])    Uncontrolled type 2 diabetes mellitus with hyperglycemia ([place])    Small vessel disease, cerebrovascular 06/21/2020   Thrombocytopenia ([place])    Stroke ([place]) 06/18/2020   Acute cerebral infarction ([place])  06/17/2020   Hyperlipidemia 02/06/2016   Cognitive impairment 02/06/2016   Morbid obesity (HCC) 02/06/2016   Diabetes (HCC) 02/06/2016   Tobacco use disorder    Essential hypertension    PCP:  [facility]:   [facility] - [place],  [place] - [address] [place] [address] Phone: [phone] Fax: [phone]     Social Determinants of Health (SDOH) Interventions    Readmission Risk Interventions     No data to display

## 2022-01-02 NOTE — Progress Notes (Signed)
Pharmacy Antibiotic Note  Frank Luna is a 61 y.o. male admitted on 01/01/2022 with sepsis.  Pharmacy has been consulted for vancomycin and cefepime dosing.  Pt w/ AKI; baseline SCr ~1.1, current 1.98, still 2.2 this AM.  Plan: Continue Vancomycin 750 mg IV Q 24 hrs. Goal AUC 400-550. Expected AUC: 510 SCr used: 2  Cefepime 2g IV Q12H. F/U cxs and clinical progress Monitor V/S, labs and levels as indicated  Height: 5\' 7"  (170.2 cm) Weight: 61.5 kg (135 lb 9.3 oz) IBW/kg (Calculated) : 66.1  Temp (24hrs), Avg:98.4 F (36.9 C), Min:98 F (36.7 C), Max:99.1 F (37.3 C)  Recent Labs  Lab 01/01/22 1802 01/01/22 2230 01/02/22 0259  WBC 19.3*  --  18.1*  CREATININE 1.98*  --  2.02*  LATICACIDVEN  --  5.4* 6.1*     Estimated Creatinine Clearance: 33.4 mL/min (A) (by C-G formula based on SCr of 2.02 mg/dL (H)).    Allergies  Allergen Reactions   Penicillins Rash    Rash per care everywhere   Antibiotics: Vancomycin 11/15>> Cefepime 11/15>. Flagyl 11/15> 115 Azithromycin 11/15   Cultures: 11/14 BCX: pending 11/14UCX:pending 11/14 MRSA PCR is neg  Thank you for allowing pharmacy to be a part of this patient's care.  12/14, BS Pharm D, BCPS Clinical Pharmacist 01/02/2022 11:36 AM

## 2022-01-02 NOTE — Progress Notes (Signed)
Initial Nutrition Assessment  DOCUMENTATION CODES:   Not applicable  INTERVENTION:   RD will monitor for diet advancement vs the need for nutrition support.   Recommend SLP evaluation prior to diet initiation  Pt at high refeed risk  NUTRITION DIAGNOSIS:   Inadequate oral intake related to acute illness as evidenced by per patient/family report.  GOAL:   Patient will meet greater than or equal to 90% of their needs  MONITOR:   Labs, Diet advancement, Weight trends, Skin, I & O's  REASON FOR ASSESSMENT:   Malnutrition Screening Tool    ASSESSMENT:   61 y/o male with h/o multiple strokes, DM, HTN and thrombocytopenia who is admitted with PNA, AMS, AKI and SIRS.  RD working remotely.  Unable to speak with pt by phone r/t AMS. Per chart review, pt's mother reports pt with poor oral intake for at least 3 days pta. Pt has remained NPO since admission. Per chart, pt appears weight stable pta; RD suspects good oral intake at baseline. RD will monitor for diet advancement and add supplements when able. Recommend NGT placement and nutrition support if unable to initiate an oral diet in the next 1-2 days. Recommend SLP evaluation prior to diet initiation. Pt is likely at high refeed risk. RD will obtain nutrition related history and exam at follow-up.   Medications reviewed and include: heparin, insulin, azithromycin, cefepime, vancomycin   Labs reviewed: Na 152(H), K 4.6 wnl, BUN 68(H), creat 2.02(H) Vitamin D- 14.5(L)- 05/2020 Wbc- 18.1(H) Cbgs- 156, 144, 125, 212 x 24 hrs AIC 6.5(H)  NUTRITION - FOCUSED PHYSICAL EXAM: Unable to perform at this time   Diet Order:   Diet Order             Diet NPO time specified  Diet effective now                  EDUCATION NEEDS:   No education needs have been identified at this time  Skin:  Skin Assessment: Reviewed RN Assessment (ecchymosis)  Last BM:  11/15  Height:   Ht Readings from Last 1 Encounters:  01/02/22 5'  7" (1.702 m)    Weight:   Wt Readings from Last 1 Encounters:  01/02/22 61.5 kg    Ideal Body Weight:  67.2 kg  BMI:  Body mass index is 21.24 kg/m.  Estimated Nutritional Needs:   Kcal:  1900-2200kcal/day  Protein:  95-110g/day  Fluid:  2.0-2.3L/day  Betsey Holiday MS, RD, LDN Please refer to [facility] for RD and/or RD on-call/weekend/after hours pager

## 2022-01-02 NOTE — Progress Notes (Signed)
PROGRESS NOTE     Frank Luna, is a 61 y.o. male, DOB - 1960-06-25, ZOX:[id number]  Admit date - 01/01/2022   Admitting Physician Ejiroghene Wendall Stade, MD  Outpatient Primary MD for the patient is [facility]lin Sh[facility]  LOS - 1  Chief Complaint  Patient presents with   Shortness of Breath        Brief Narrative:  61 y.o. male with medical history significant for diabetes mellitus, stroke, hypertension  Admitted on 01/01/2022 with sepsis secondary to sepsis secondary to multifocal pneumonia left more than right with acute hypoxic respiratory failure requiring up to 15 L of oxygen on nonrebreather bag initially as well as acute metabolic encephalopathy and AKI    -Assessment and Plan: 1)Acute respiratory failure with Hypoxia  secondary to multifocal pneumonia left more than right with acute hypoxic respiratory failure requiring up to 15 L of oxygen on nonrebreather bag initially--- -Oxygen Weaned down to 2 L per nasal cannula at this time -CT chest without contrast with multifocal pneumonia mostly on the left side -Add Azithromycin -Continue cefepime and vancomycin -Consider de-escalating from vancomycin over the next 24 to 48 hours pending clinical response to treatment -Continue bronchodilators and mucolytics  2)Sepsis secondary to multifocal pneumonia left more than right with acute hypoxic respiratory failure  WBC 19.3 >>18.1 PCT 0.24 >>0.32 -Lactic Acid 5.4 >> 6.1  -Management as above #1 -Additional IV fluids as ordered  3)Dehydration/Hypernatremia---Na 147 >> 152---due to poor oral intake and sepsis  IVF adjusted -Monitor serial BMP, adjust IV fluid consistency and rate as indicated   4)AKI (acute kidney injury) (HCC) On admission Cr elevated at 1.98, BUN of 69.  -As per Care Everywhere creatinine was 1.0 on 10/24/2021 -Creatinine continues to trend up at this time -IV fluids adjusted as above -renally adjust medications, avoid nephrotoxic agents / dehydration   / hypotension -Hold lisinopril  5)Recurrent Strokes----MRI Brain from 09/27/21 showed Acute perforator infarct involving the left basal ganglia,  Evolving subacute infarct in the right corona radiata, Remote lacunar infarcts, chronic microvascular ischemic disease and cerebral atrophy -Patient had speech, swallowing, cognitive deficits as well as left-sided hemiparesis--- since July 2023 got worse in August 2023 despite rehab--he has been unable to make significant improvement -Per patient's mother patient has had moderate memory and cognitive deficits since his strokes over the summer -CT head at this time and neuro exam at this time failed to demonstrate significant concerns for new additional strokes  6)Acute metabolic encephalopathy--secondary to sepsis/AKI/hypoxia compounded with underlying strokes -Seroquel discontinued -Treat above etiologies   7)Elevated troponin---Sepsis and hypoxia related demand ischemia  Mild troponin elevation -troponin 34 > 35.  EKG with new diffuse T wave inversions in lead to 3 aVF V3 through V6.    8)DM2-  A1C is 6.5 reflecting good diabetic control PTA -Hold home Lantus, glipizide, Farxiga Use Novolog/Humalog Sliding scale insulin with Accu-Cheks/Fingersticks as ordered   9)HTN -Restart metoprolol and amlodipine continue to hold lisinopril due to dehydration and AKI - may use IV Hydralazine 10 mg  Every 4 hours Prn for systolic blood pressure over 160 mmhg  10)Social/Ethics--- plan of care discussed with patient's mother who is at bedside -Patient is legally married but he has been separated from his wife since April 2023 -He lives with his mother since April 2023, suffered recurrent strokes in July and August 2023 and ended up being admitted to SNF facility for rehab since August 2023 -Patient wife Frank Luna defer decision making to patient's mom Ms. Frank Luna -Patient has  a stepdaughter Frank Luna -Patient remains a DNR/DNI -Given lack of neurological and  neuromuscular improvement from strokes since July/August 2023, overall prognosis for full recovery from 2 weeks recurrent stroke related deficits Not very encouraging  Disposition/Need for in-Hospital Stay- patient unable to be discharged at this time due to -sepsis secondary to multifocal pneumonia with acute hypoxic respiratory failure dehydration AKI requiring oxygen supplementation, IV antibiotics and IV fluids-  Status is: Inpatient   Disposition: The patient is from: SNF              Anticipated d/c is to: SNF              Anticipated d/c date is: 3 days              Patient currently is not medically stable to d/c. Barriers: Not Clinically Stable-   Code Status :  -  Code Status: DNR   Family Communication:  Discussed with patient's mother who is at bedside DVT Prophylaxis  :   - SCDs   heparin injection 5,000 Units Start: 01/02/22 0600   Lab Results  Component Value Date   PLT 126 (L) 01/02/2022    Inpatient Medications  Scheduled Meds:  Chlorhexidine Gluconate Cloth  6 each Topical Daily   heparin  5,000 Units Subcutaneous Q8H   insulin aspart  0-9 Units Subcutaneous Q4H   Continuous Infusions:  azithromycin 500 mg (01/02/22 1042)   ceFEPime (MAXIPIME) IV 2 g (01/02/22 1000)   lactated ringers 100 mL/hr at 01/02/22 0026   sodium bicarbonate 75 mEq in sodium chloride 0.45 % 1,075 mL infusion     [START ON 01/03/2022] vancomycin     PRN Meds:.acetaminophen **OR** acetaminophen, albuterol, ondansetron **OR** ondansetron (ZOFRAN) IV   Anti-infectives (From admission, onward)    Start     Dose/Rate Route Frequency Ordered Stop   01/03/22 0200  vancomycin (VANCOREADY) IVPB 750 mg/150 mL        750 mg 150 mL/hr over 60 Minutes Intravenous Every 24 hours 01/02/22 1136     01/02/22 1130  azithromycin (ZITHROMAX) 500 mg in sodium chloride 0.9 % 250 mL IVPB        500 mg 250 mL/hr over 60 Minutes Intravenous Every 24 hours 01/02/22 1038     01/02/22 1000  ceFEPIme  (MAXIPIME) 2 g in sodium chloride 0.9 % 100 mL IVPB        2 g 200 mL/hr over 30 Minutes Intravenous Every 12 hours 01/02/22 0109     01/02/22 0109  vancomycin variable dose per unstable renal function (pharmacist dosing)  Status:  Discontinued         Does not apply See admin instructions 01/02/22 0109 01/02/22 1136   01/01/22 2330  ceFEPIme (MAXIPIME) 2 g in sodium chloride 0.9 % 100 mL IVPB        2 g 200 mL/hr over 30 Minutes Intravenous  Once 01/01/22 2320 01/02/22 0314   01/01/22 2315  vancomycin (VANCOREADY) IVPB 2000 mg/400 mL        2,000 mg 200 mL/hr over 120 Minutes Intravenous  Once 01/01/22 2306 01/02/22 0243   01/01/22 2245  metroNIDAZOLE (FLAGYL) IVPB 500 mg  Status:  Discontinued        500 mg 100 mL/hr over 60 Minutes Intravenous Every 12 hours 01/01/22 2235 01/02/22 1038         Subjective: Dannielle Huh Bannan today has no fevers, no emesis,  No chest pain,   - Somewhat quiet, wakes up to voice,,  Able to responding single word answers -Oxygen has been weaned down to 2 L via nasal cannula  Objective: Vitals:   01/02/22 0800 01/02/22 0900 01/02/22 0940 01/02/22 1135  BP: 126/70 (!) 121/58    Pulse:  99 (!) 103   Resp: (!) 25 (!) 26 (!) 26   Temp:    98.4 F (36.9 C)  TempSrc:    Axillary  SpO2:  100% 100%   Weight:      Height:        Intake/Output Summary (Last 24 hours) at 01/02/2022 1315 Last data filed at 01/02/2022 0429 Gross per 24 hour  Intake 2454.57 ml  Output --  Net 2454.57 ml   Filed Weights   01/01/22 2203 01/02/22 0012  Weight: 93.4 kg 61.5 kg    Physical Exam  Gen:-Wakes up to voice, no acute distress HEENT:- [place].AT, No sclera icterus Nose- [place] 2L/min Neck-Supple Neck,No JVD,.  Lungs-diminished breath sounds with rhonchi especially on the left  CV- S1, S2 normal, regular  Abd-  +ve B.Sounds, Abd Soft, No tenderness,    Extremity/Skin:- No  edema, pedal pulses present  Psych-moderate memory and cognitive deficits since his strokes over  the summer Neuro-left-sided hemiparesis which is not new, no additional new focal deficits, no tremors  Data Reviewed: I have personally reviewed following labs and imaging studies  CBC: Recent Labs  Lab 01/01/22 1802 01/02/22 0259  WBC 19.3* 18.1*  NEUTROABS 16.9*  --   HGB 17.3* 14.7  HCT 52.2* 46.7  MCV 93.2 95.9  PLT 163 126*   Basic Metabolic Panel: Recent Labs  Lab 01/01/22 1802 01/02/22 0259  NA 147* 152*  K 5.0 4.6  CL 114* 115*  CO2 21* 21*  GLUCOSE 216* 155*  BUN 69* 68*  CREATININE 1.98* 2.02*  CALCIUM 10.1 9.4  MG 2.6*  --    GFR: Estimated Creatinine Clearance: 33.4 mL/min (A) (by C-G formula based on SCr of 2.02 mg/dL (H)). Recent Labs    01/02/22 0300  HGBA1C 6.5*   Recent Results (from the past 240 hour(s))  Resp Panel by RT-PCR (Flu A&B, Covid) Anterior Nasal Swab     Status: None   Collection Time: 01/01/22  6:09 PM   Specimen: Anterior Nasal Swab  Result Value Ref Range Status   SARS Coronavirus 2 by RT PCR NEGATIVE NEGATIVE Final    Comment: (NOTE) SARS-CoV-2 target nucleic acids are NOT DETECTED.  The SARS-CoV-2 RNA is generally detectable in upper respiratory specimens during the acute phase of infection. The lowest concentration of SARS-CoV-2 viral copies this assay can detect is 138 copies/mL. A negative result does not preclude SARS-Cov-2 infection and should not be used as the sole basis for treatment or other patient management decisions. A negative result may occur with  improper specimen collection/handling, submission of specimen other than nasopharyngeal swab, presence of viral mutation(s) within the areas targeted by this assay, and inadequate number of viral copies(<138 copies/mL). A negative result must be combined with clinical observations, patient history, and epidemiological information. The expected result is Negative.  Fact Sheet for Patients:  BloggerCourse.comhttps://www.fda.gov/media/152166/download  Fact Sheet for Healthcare  Providers:  SeriousBroker.ithttps://www.fda.gov/media/152162/download  This test is no t yet approved or cleared by the [place] FDA and  has been authorized for detection and/or diagnosis of SARS-CoV-2 by FDA under an Emergency Use Authorization (EUA). This EUA will remain  in effect (meaning this test can be used) for the duration of the COVID-19 declaration under Section 564(b)(1) of the  Act, 21 U.S.C.section 360bbb-3(b)(1), unless the authorization is terminated  or revoked sooner.       Influenza A by PCR NEGATIVE NEGATIVE Final   Influenza B by PCR NEGATIVE NEGATIVE Final    Comment: (NOTE) The Xpert Xpress SARS-CoV-2/FLU/RSV plus assay is intended as an aid in the diagnosis of influenza from Nasopharyngeal swab specimens and should not be used as a sole basis for treatment. Nasal washings and aspirates are unacceptable for Xpert Xpress SARS-CoV-2/FLU/RSV testing.  Fact Sheet for Patients: BloggerCourse.com  Fact Sheet for Healthcare Providers: SeriousBroker.it  This test is not yet approved or cleared by the [place] FDA and has been authorized for detection and/or diagnosis of SARS-CoV-2 by FDA under an Emergency Use Authorization (EUA). This EUA will remain in effect (meaning this test can be used) for the duration of the COVID-19 declaration under Section 564(b)(1) of the Act, 21 U.S.C. section 360bbb-3(b)(1), unless the authorization is terminated or revoked.  Performed at [facility], [address] [place], [place] [address]   Culture, blood (Routine X 2) w Reflex to ID Panel     Status: None (Preliminary result)   Collection Time: 01/01/22 10:29 PM   Specimen: Right Antecubital; Blood  Result Value Ref Range Status   Specimen Description RIGHT ANTECUBITAL  Final   Special Requests   Final    BOTTLES DRAWN AEROBIC AND ANAEROBIC Blood Culture adequate volume   Culture   Final    NO GROWTH < 12 HOURS Performed at  [facility], [address], [place], [place] [address]    Report Status PENDING  Incomplete  Culture, blood (Routine X 2) w Reflex to ID Panel     Status: None (Preliminary result)   Collection Time: 01/01/22 10:29 PM   Specimen: BLOOD RIGHT HAND  Result Value Ref Range Status   Specimen Description BLOOD RIGHT HAND  Final   Special Requests   Final    BOTTLES DRAWN AEROBIC AND ANAEROBIC Blood Culture results may not be optimal due to an inadequate volume of blood received in culture bottles   Culture   Final    NO GROWTH < 12 HOURS Performed at [facility], [address], [place], [place] [address]    Report Status PENDING  Incomplete  MRSA Next Gen by PCR, Nasal     Status: None   Collection Time: 01/01/22 11:58 PM   Specimen: Nasal Mucosa; Nasal Swab  Result Value Ref Range Status   MRSA by PCR Next Gen NOT DETECTED NOT DETECTED Final    Comment: (NOTE) The GeneXpert MRSA Assay (FDA approved for NASAL specimens only), is one component of a comprehensive MRSA colonization surveillance program. It is not intended to diagnose MRSA infection nor to guide or monitor treatment for MRSA infections. Test performance is not FDA approved in patients less than 43 years old. Performed at [facility], [address] [place], [place] [address]       Radiology Studies: CT CHEST WO CONTRAST  Result Date: 01/01/2022 CLINICAL DATA:  Acute hypoxic respiratory failure EXAM: CT CHEST WITHOUT CONTRAST TECHNIQUE: Multidetector CT imaging of the chest was performed following the standard protocol without IV contrast. RADIATION DOSE REDUCTION: This exam was performed according to the departmental dose-optimization program which includes automated exposure control, adjustment of the mA and/or kV according to patient size and/or use of iterative reconstruction technique. COMPARISON:  Chest radiograph dated 01/01/2022 FINDINGS: Cardiovascular: Heart is normal in size.  No pericardial effusion. No  evidence thoracic aortic aneurysm.  Atherosclerotic calcifications of the aortic arch. Three vessel coronary atherosclerosis. Mediastinum/Nodes: No suspicious mediastinal lymphadenopathy. Visualized thyroid is unremarkable. Lungs/Pleura: Evaluation of the lung parenchyma is constrained by respiratory motion. Peribronchovascular nodularity in the right upper lobe, central right middle lobe, and superior segment right lower lobe. Additional patchy left lower lobe opacity. These findings are compatible with multifocal pneumonia, although mild superimposed left lower lobe atelectasis is possible. Underlying mild centrilobular emphysematous changes are suspected, although poorly evaluated due to motion degradation. No pleural effusion or pneumothorax. Upper Abdomen: Visualized upper abdomen is grossly unremarkable, noting vascular calcifications. Musculoskeletal: Degenerative changes of the visualized thoracolumbar spine. IMPRESSION: Motion degraded images. Multifocal pneumonia, left lower lobe predominant. Aortic Atherosclerosis (ICD10-I70.0) and Emphysema (ICD10-J43.9). Electronically Signed   By: Charline Bills M.D.   On: 01/01/2022 23:11   CT Head Wo Contrast  Result Date: 01/01/2022 CLINICAL DATA:  Delirium EXAM: CT HEAD WITHOUT CONTRAST TECHNIQUE: Contiguous axial images were obtained from the base of the skull through the vertex without intravenous contrast. RADIATION DOSE REDUCTION: This exam was performed according to the departmental dose-optimization program which includes automated exposure control, adjustment of the mA and/or kV according to patient size and/or use of iterative reconstruction technique. COMPARISON:  08/30/2020 FINDINGS: Brain: Normal anatomic configuration. Parenchymal volume loss is commensurate with the patient's age. Moderate periventricular white matter changes are present likely reflecting the sequela of small vessel ischemia. Remote lacunar infarcts within the carina radiology  and thalami bilaterally are identified with remote appearing infarcts within the left thalamus and posterior left corona radiata new in the interval since prior examination. No abnormal intra or extra-axial mass lesion or fluid collection. No abnormal mass effect or midline shift. No evidence of acute intracranial hemorrhage or infarct. Ventricular size is normal. Cerebellum unremarkable. Vascular: No asymmetric hyperdense vasculature at the skull base. Skull: Intact Sinuses/Orbits: Paranasal sinuses are clear. Orbits are unremarkable. Other: Fluid is seen within the left mastoid air cells without superimposed osseous erosion. Middle ear cavities and right mastoid air cells are clear. IMPRESSION: 1. No acute intracranial hemorrhage or infarct. 2. Moderate senescent change. 3. Remote appearing infarcts within the left thalamus and posterior left corona radiata, new in the interval since prior examination. 4. Left mastoid effusion. Electronically Signed   By: Helyn Numbers M.D.   On: 01/01/2022 21:22   DG Chest Port 1 View  Result Date: 01/01/2022 CLINICAL DATA:  Respiratory distress EXAM: PORTABLE CHEST 1 VIEW COMPARISON:  02/14/2018 FINDINGS: Mild bronchitic changes. No acute airspace disease, pleural effusion or pneumothorax. Stable cardiomediastinal silhouette. Aortic atherosclerosis IMPRESSION: Bronchitic changes without focal airspace disease Electronically Signed   By: Jasmine Pang M.D.   On: 01/01/2022 18:40     Scheduled Meds:  Chlorhexidine Gluconate Cloth  6 each Topical Daily   heparin  5,000 Units Subcutaneous Q8H   insulin aspart  0-9 Units Subcutaneous Q4H   Continuous Infusions:  azithromycin 500 mg (01/02/22 1042)   ceFEPime (MAXIPIME) IV 2 g (01/02/22 1000)   lactated ringers 100 mL/hr at 01/02/22 0026   sodium bicarbonate 75 mEq in sodium chloride 0.45 % 1,075 mL infusion     [START ON 01/03/2022] vancomycin       LOS: 1 day    Shon Hale M.D on 01/02/2022 at 1:15  PM  Go to [email or web address] - for contact info  [facility] - Office  [phone]  If 7PM-7AM, please contact night-coverage [email or web address] 01/02/2022, 1:15 PM

## 2022-01-02 NOTE — Progress Notes (Signed)
Pharmacy Antibiotic Note  Frank Luna is a 61 y.o. male admitted on 01/01/2022 with sepsis.  Pharmacy has been consulted for vancomycin and cefepime dosing.  Pt w/ AKI; baseline SCr ~1.1, current 1.98.  Plan: Vancomycin 2000mg  IV x1; monitor SCr +/- SCr prior to redosing. Cefepime 2g IV Q12H.  Height: 5\' 7"  (170.2 cm) Weight: 61.5 kg (135 lb 9.3 oz) IBW/kg (Calculated) : 66.1  Temp (24hrs), Avg:98.4 F (36.9 C), Min:98 F (36.7 C), Max:99.1 F (37.3 C)  Recent Labs  Lab 01/01/22 1802 01/01/22 2230  WBC 19.3*  --   CREATININE 1.98*  --   LATICACIDVEN  --  5.4*    Estimated Creatinine Clearance: 34.1 mL/min (A) (by C-G formula based on SCr of 1.98 mg/dL (H)).    Allergies  Allergen Reactions   Penicillins Rash    Rash per care everywhere    Thank you for allowing pharmacy to be a part of this patient's care.  01/03/22, PharmD, BCPS  01/02/2022 1:10 AM

## 2022-01-02 NOTE — Progress Notes (Signed)
BIPAP attempted per nurse before order from MD. Patient became agitated rr increased to 60 spo2 decreased to 77%, patient taken off BIPAP and placed back on 100% nrb spo2 increased back  to 90%  rr 36. MD informed

## 2022-01-03 ENCOUNTER — Other Ambulatory Visit: Payer: Self-pay

## 2022-01-03 DIAGNOSIS — J9601 Acute respiratory failure with hypoxia: Secondary | ICD-10-CM | POA: Diagnosis not present

## 2022-01-03 LAB — URINALYSIS, ROUTINE W REFLEX MICROSCOPIC
Bilirubin Urine: NEGATIVE
Glucose, UA: NEGATIVE mg/dL
Ketones, ur: NEGATIVE mg/dL
Nitrite: NEGATIVE
Protein, ur: 30 mg/dL — AB
Specific Gravity, Urine: 1.02 (ref 1.005–1.030)
WBC, UA: >50 WBC/hpf — ABNORMAL HIGH (ref 0–5)
pH: 5 (ref 5.0–8.0)

## 2022-01-03 LAB — CBC
HCT: 36 % — ABNORMAL LOW (ref 39.0–52.0)
Hemoglobin: 11.9 g/dL — ABNORMAL LOW (ref 13.0–17.0)
MCH: 30.7 pg (ref 26.0–34.0)
MCHC: 33.1 g/dL (ref 30.0–36.0)
MCV: 92.8 fL (ref 80.0–100.0)
Platelets: 89 10*3/uL — ABNORMAL LOW (ref 150–400)
RBC: 3.88 MIL/uL — ABNORMAL LOW (ref 4.22–5.81)
RDW: 14.6 % (ref 11.5–15.5)
WBC: 14.9 10*3/uL — ABNORMAL HIGH (ref 4.0–10.5)
nRBC: 0 % (ref 0.0–0.2)

## 2022-01-03 LAB — PROCALCITONIN: Procalcitonin: 0.42 ng/mL

## 2022-01-03 LAB — BASIC METABOLIC PANEL
Anion gap: 6 (ref 5–15)
BUN: 50 mg/dL — ABNORMAL HIGH (ref 8–23)
CO2: 21 mmol/L — ABNORMAL LOW (ref 22–32)
Calcium: 8.5 mg/dL — ABNORMAL LOW (ref 8.9–10.3)
Chloride: 122 mmol/L — ABNORMAL HIGH (ref 98–111)
Creatinine, Ser: 1.14 mg/dL (ref 0.61–1.24)
GFR, Estimated: >60 mL/min (ref >60)
Glucose, Bld: 101 mg/dL — ABNORMAL HIGH (ref 70–99)
Potassium: 2.8 mmol/L — ABNORMAL LOW (ref 3.5–5.1)
Sodium: 149 mmol/L — ABNORMAL HIGH (ref 135–145)

## 2022-01-03 LAB — GLUCOSE, CAPILLARY
Glucose-Capillary: 105 mg/dL — ABNORMAL HIGH (ref 70–99)
Glucose-Capillary: 107 mg/dL — ABNORMAL HIGH (ref 70–99)
Glucose-Capillary: 107 mg/dL — ABNORMAL HIGH (ref 70–99)
Glucose-Capillary: 110 mg/dL — ABNORMAL HIGH (ref 70–99)
Glucose-Capillary: 138 mg/dL — ABNORMAL HIGH (ref 70–99)
Glucose-Capillary: 98 mg/dL (ref 70–99)

## 2022-01-03 MED ORDER — SODIUM CHLORIDE 0.45 % IV SOLN: INTRAVENOUS | Status: DC

## 2022-01-03 MED ORDER — POTASSIUM CHLORIDE 10 MEQ/100ML IV SOLN
10.0000 meq | INTRAVENOUS | Status: AC
  Administered 2022-01-03 (×2): 10 meq via INTRAVENOUS
  Filled 2022-01-03: qty 100

## 2022-01-03 MED ORDER — POTASSIUM CHLORIDE 10 MEQ/100ML IV SOLN
10.0000 meq | INTRAVENOUS | Status: AC
  Administered 2022-01-03 (×4): 10 meq via INTRAVENOUS
  Filled 2022-01-03: qty 100

## 2022-01-03 MED ORDER — ACETAMINOPHEN 650 MG RE SUPP
650.0000 mg | Freq: Once | RECTAL | Status: AC
  Administered 2022-01-03: 650 mg via RECTAL
  Filled 2022-01-03: qty 1

## 2022-01-03 MED ORDER — SODIUM CHLORIDE 0.9 % IV SOLN
2.0000 g | Freq: Three times a day (TID) | INTRAVENOUS | Status: DC
  Administered 2022-01-03 – 2022-01-04 (×4): 2 g via INTRAVENOUS
  Filled 2022-01-03 (×3): qty 12.5

## 2022-01-03 MED ORDER — POTASSIUM CHLORIDE IN NACL 20-0.45 MEQ/L-% IV SOLN
INTRAVENOUS | Status: DC
  Filled 2022-01-03 (×9): qty 1000

## 2022-01-03 MED ORDER — POTASSIUM CHLORIDE CRYS ER 20 MEQ PO TBCR: 40.0000 meq | EXTENDED_RELEASE_TABLET | Freq: Once | ORAL | Status: DC

## 2022-01-03 NOTE — Progress Notes (Signed)
PROGRESS NOTE     Frank Luna, is a 61 y.o. male, DOB - 1960/06/15, [id number]  Admit date - 01/01/2022   Admitting Physician Ejiroghene Arlyce Dice, MD  Outpatient Primary MD for the patient is [name] [facility]  LOS - 2  Chief Complaint  Patient presents with   Shortness of Breath        Brief Narrative:  61 y.o. male with medical history significant for diabetes mellitus, stroke, hypertension  Admitted on 01/01/2022 with sepsis secondary to sepsis secondary to multifocal pneumonia left more than right with acute hypoxic respiratory failure requiring up to 15 L of oxygen on nonrebreather bag initially as well as acute metabolic encephalopathy and AKI    -Assessment and Plan: 1)Acute respiratory failure with Hypoxia  secondary to multifocal pneumonia left more than right with acute hypoxic respiratory failure requiring up to 15 L of oxygen on nonrebreather bag initially--- -Oxygen Weaned off on 01/03/22 -CT chest without contrast with multifocal pneumonia mostly on the left side -Added Azithromycin on 01/02/22 -Continue cefepime  -Stop Vancomycin on 01/03/22 --Continue bronchodilators and mucolytics -Hypoxia has resolved  2)Sepsis secondary to multifocal pneumonia left more than right with acute hypoxic respiratory failure  WBC 19.3 >>18.1>>14.9 PCT 0.24 >>0.32>>0.42 -Lactic Acid 5.4 >> 6.1  -Management as above #1 -Additional IV fluids as ordered  3) persistent fevers--- presumed UTI UA on 01/03/2022 suggestive of UTI, urine culture pending -Continue cefepime as above #1  3)Dehydration/Hypernatremia---Na 147 >> 152---due to poor oral intake and sepsis  IVF adjusted -Monitor serial BMP, adjust IV fluid consistency and rate as indicated   4)AKI (acute kidney injury) ([facility]) On admission Cr elevated at 1.98, BUN of 69.  -As per Care Everywhere creatinine was 1.0 on 10/24/2021 -Creatinine continues to trend up at this time -IV fluids adjusted as  above -renally adjust medications, avoid nephrotoxic agents / dehydration  / hypotension -Hold lisinopril  5)Recurrent Strokes----MRI Brain from 09/27/21 showed Acute perforator infarct involving the left basal ganglia,  Evolving subacute infarct in the right corona radiata, Remote lacunar infarcts, chronic microvascular ischemic disease and cerebral atrophy -Patient had speech, swallowing, cognitive deficits as well as left-sided hemiparesis--- since July 2023 got worse in August 2023 despite rehab--he has been unable to make significant improvement -Per patient's mother patient has had moderate memory and cognitive deficits since his strokes over the summer -CT head at this time and neuro exam at this time failed to demonstrate significant concerns for new additional strokes  6)Acute metabolic encephalopathy--secondary to sepsis/AKI/hypoxia compounded with underlying strokes -Seroquel discontinued -Treat above etiologies  7)Elevated troponin---Sepsis and hypoxia related demand ischemia  Mild troponin elevation -troponin 34 > 35.  EKG with new diffuse T wave inversions in lead to 3 aVF V3 through V6.    8)DM2-  A1C is 6.5 reflecting good diabetic control PTA -Hold home Lantus, glipizide, Farxiga Use Novolog/Humalog Sliding scale insulin with Accu-Cheks/Fingersticks as ordered   9)HTN -Restart metoprolol and amlodipine -continue to hold lisinopril due to dehydration and AKI - may use IV Hydralazine 10 mg  Every 4 hours Prn for systolic blood pressure over 160 mmhg  10)Social/Ethics--- plan of care discussed with patient's mother who is at bedside -Patient is legally married but he has been separated from his wife since April 2023 -He lives with his mother since April 2023, suffered recurrent strokes in July and August 2023 and ended up being admitted to SNF facility for rehab since August 2023 -Patient wife [name] defer decision making to patient's mom  Ms. [name] -Patient has a  stepdaughter [name] -Patient remains a DNR/DNI -Given lack of neurological and neuromuscular improvement from strokes since July/August 2023, overall prognosis for full recovery from 2 weeks recurrent stroke related deficits Not very encouraging  11)Hypernatremia------Na 147 >> 152>>> 144>> 149 -Suspect free water deficit -Patient is n.p.o. due to swallowing difficulties -He is diabetic so avoid dextrose -c/n 1/2 NS iv   12)Hypokalemia--- replace and recheck, check mag  13) dysphagia--- history of multiple strokes now some altered mentation due to acute illness, aspiration risk- --speech eval requested   Disposition/Need for in-Hospital Stay- patient unable to be discharged at this time due to -sepsis secondary to multifocal pneumonia with acute hypoxic respiratory failure dehydration AKI requiring oxygen supplementation, IV antibiotics and IV fluids-  Status is: Inpatient   Disposition: The patient is from: SNF              Anticipated d/c is to: SNF              Anticipated d/c date is: 2 days              Patient currently is not medically stable to d/c. Barriers: Not Clinically Stable-   Code Status :  -  Code Status: DNR   Family Communication:  Discussed with patient's mother who is at bedside DVT Prophylaxis  :   - SCDs   heparin injection 5,000 Units Start: 01/02/22 0600  Lab Results  Component Value Date   PLT 89 (L) 01/03/2022   Inpatient Medications  Scheduled Meds:  aspirin EC  81 mg Oral Q breakfast   atorvastatin  40 mg Oral Daily   Chlorhexidine Gluconate Cloth  6 each Topical Daily   heparin  5,000 Units Subcutaneous Q8H   insulin aspart  0-9 Units Subcutaneous Q4H   potassium chloride  40 mEq Oral Once   Continuous Infusions:  sodium chloride 125 mL/hr at 01/03/22 1501   azithromycin Stopped (01/03/22 1209)   ceFEPime (MAXIPIME) IV Stopped (01/03/22 1030)   sodium bicarbonate 75 mEq in sodium chloride 0.45 % 1,075 mL infusion 10 mL/hr at 01/03/22 1501    PRN Meds:.acetaminophen **OR** acetaminophen, albuterol, ondansetron **OR** ondansetron (ZOFRAN) IV   Anti-infectives (From admission, onward)    Start     Dose/Rate Route Frequency Ordered Stop   01/03/22 1000  ceFEPIme (MAXIPIME) 2 g in sodium chloride 0.9 % 100 mL IVPB        2 g 200 mL/hr over 30 Minutes Intravenous Every 8 hours 01/03/22 0922     01/03/22 0200  vancomycin (VANCOREADY) IVPB 750 mg/150 mL  Status:  Discontinued        750 mg 150 mL/hr over 60 Minutes Intravenous Every 24 hours 01/02/22 1136 01/03/22 1029   01/02/22 1130  azithromycin (ZITHROMAX) 500 mg in sodium chloride 0.9 % 250 mL IVPB        500 mg 250 mL/hr over 60 Minutes Intravenous Every 24 hours 01/02/22 1038     01/02/22 1000  ceFEPIme (MAXIPIME) 2 g in sodium chloride 0.9 % 100 mL IVPB  Status:  Discontinued        2 g 200 mL/hr over 30 Minutes Intravenous Every 12 hours 01/02/22 0109 01/03/22 0922   01/02/22 0109  vancomycin variable dose per unstable renal function (pharmacist dosing)  Status:  Discontinued         Does not apply See admin instructions 01/02/22 0109 01/02/22 1136   01/01/22 2330  ceFEPIme (MAXIPIME) 2 g in  sodium chloride 0.9 % 100 mL IVPB        2 g 200 mL/hr over 30 Minutes Intravenous  Once 01/01/22 2320 01/02/22 0314   01/01/22 2315  vancomycin (VANCOREADY) IVPB 2000 mg/400 mL        2,000 mg 200 mL/hr over 120 Minutes Intravenous  Once 01/01/22 2306 01/02/22 0243   01/01/22 2245  metroNIDAZOLE (FLAGYL) IVPB 500 mg  Status:  Discontinued        500 mg 100 mL/hr over 60 Minutes Intravenous Every 12 hours 01/01/22 2235 01/02/22 1038       Subjective: Carson Myrtle today has no fevers, no emesis,  No chest pain,   - Weaned off oxygen,  Mother at bedside More awake Swallowing difficulties persist--  Objective: Vitals:   01/03/22 1300 01/03/22 1355 01/03/22 1400 01/03/22 1500  BP: 112/66  (!) 132/59 (!) 171/75  Pulse: 92 87 99 (!) 111  Resp: 15 15 15 20   Temp:       TempSrc:      SpO2: 99% 93% 96% 94%  Weight:      Height:        Intake/Output Summary (Last 24 hours) at 01/03/2022 1527 Last data filed at 01/03/2022 1501 Gross per 24 hour  Intake 3134.41 ml  Output 630 ml  Net 2504.41 ml   Filed Weights   01/01/22 2203 01/02/22 0012 01/03/22 0439  Weight: 93.4 kg 61.5 kg 62.3 kg   Physical Exam  Gen:-Wakes up to voice, no acute distress HEENT:- [place].AT, No sclera icterus Nose--weaned off Oxygen on 01/03/22 Neck-Supple Neck,No JVD,.  Lungs-diminished breath sounds , especially on the left , no wheezing  CV- S1, S2 normal, regular  Abd-  +ve B.Sounds, Abd Soft, No tenderness,    Extremity/Skin:- No  edema, pedal pulses present  Psych-moderate memory and cognitive deficits since his strokes over the summer Neuro-residual left-sided hemiparesis which is not new, no additional new focal deficits, no tremors  Data Reviewed: I have personally reviewed following labs and imaging studies  CBC: Recent Labs  Lab 01/01/22 1802 01/02/22 0259 01/03/22 0448  WBC 19.3* 18.1* 14.9*  NEUTROABS 16.9*  --   --   HGB 17.3* 14.7 11.9*  HCT 52.2* 46.7 36.0*  MCV 93.2 95.9 92.8  PLT 163 126* 89*   Basic Metabolic Panel: Recent Labs  Lab 01/01/22 1802 01/02/22 0259 01/02/22 1516 01/03/22 0448  NA 147* 152* 144 149*  K 5.0 4.6 3.2* 2.8*  CL 114* 115* 118* 122*  CO2 21* 21* 19* 21*  GLUCOSE 216* 155* 128* 101*  BUN 69* 68* 62* 50*  CREATININE 1.98* 2.02* 1.53* 1.14  CALCIUM 10.1 9.4 8.4* 8.5*  MG 2.6*  --   --   --   PHOS  --   --  2.7  --    GFR: Estimated Creatinine Clearance: 60 mL/min (by C-G formula based on SCr of 1.14 mg/dL). Recent Labs    01/02/22 0300  HGBA1C 6.5*   Recent Results (from the past 240 hour(s))  Resp Panel by RT-PCR (Flu A&B, Covid) Anterior Nasal Swab     Status: None   Collection Time: 01/01/22  6:09 PM   Specimen: Anterior Nasal Swab  Result Value Ref Range Status   SARS Coronavirus 2 by RT PCR NEGATIVE  NEGATIVE Final    Comment: (NOTE) SARS-CoV-2 target nucleic acids are NOT DETECTED.  The SARS-CoV-2 RNA is generally detectable in upper respiratory specimens during the acute phase of infection. The lowest concentration  of SARS-CoV-2 viral copies this assay can detect is 138 copies/mL. A negative result does not preclude SARS-Cov-2 infection and should not be used as the sole basis for treatment or other patient management decisions. A negative result may occur with  improper specimen collection/handling, submission of specimen other than nasopharyngeal swab, presence of viral mutation(s) within the areas targeted by this assay, and inadequate number of viral copies(<138 copies/mL). A negative result must be combined with clinical observations, patient history, and epidemiological information. The expected result is Negative.  Fact Sheet for Patients:  EntrepreneurPulse.com.au  Fact Sheet for Healthcare Providers:  IncredibleEmployment.be  This test is no t yet approved or cleared by the [place] FDA and  has been authorized for detection and/or diagnosis of SARS-CoV-2 by FDA under an Emergency Use Authorization (EUA). This EUA will remain  in effect (meaning this test can be used) for the duration of the COVID-19 declaration under Section 564(b)(1) of the Act, 21 U.S.C.section 360bbb-3(b)(1), unless the authorization is terminated  or revoked sooner.       Influenza A by PCR NEGATIVE NEGATIVE Final   Influenza B by PCR NEGATIVE NEGATIVE Final    Comment: (NOTE) The Xpert Xpress SARS-CoV-2/FLU/RSV plus assay is intended as an aid in the diagnosis of influenza from Nasopharyngeal swab specimens and should not be used as a sole basis for treatment. Nasal washings and aspirates are unacceptable for Xpert Xpress SARS-CoV-2/FLU/RSV testing.  Fact Sheet for Patients: EntrepreneurPulse.com.au  Fact Sheet for Healthcare  Providers: IncredibleEmployment.be  This test is not yet approved or cleared by the [place] FDA and has been authorized for detection and/or diagnosis of SARS-CoV-2 by FDA under an Emergency Use Authorization (EUA). This EUA will remain in effect (meaning this test can be used) for the duration of the COVID-19 declaration under Section 564(b)(1) of the Act, 21 U.S.C. section 360bbb-3(b)(1), unless the authorization is terminated or revoked.  Performed at [facility], [address], [place], [place] [address]   Culture, blood (Routine X 2) w Reflex to ID Panel     Status: None (Preliminary result)   Collection Time: 01/01/22 10:29 PM   Specimen: Right Antecubital; Blood  Result Value Ref Range Status   Specimen Description RIGHT ANTECUBITAL  Final   Special Requests   Final    BOTTLES DRAWN AEROBIC AND ANAEROBIC Blood Culture adequate volume   Culture   Final    NO GROWTH < 12 HOURS Performed at [facility], [address] [place], [place] [address]    Report Status PENDING  Incomplete  Culture, blood (Routine X 2) w Reflex to ID Panel     Status: None (Preliminary result)   Collection Time: 01/01/22 10:29 PM   Specimen: BLOOD RIGHT HAND  Result Value Ref Range Status   Specimen Description BLOOD RIGHT HAND  Final   Special Requests   Final    BOTTLES DRAWN AEROBIC AND ANAEROBIC Blood Culture results may not be optimal due to an inadequate volume of blood received in culture bottles   Culture   Final    NO GROWTH < 12 HOURS Performed at [facility], [address], [place], [place] [address]    Report Status PENDING  Incomplete  MRSA Next Gen by PCR, Nasal     Status: None   Collection Time: 01/01/22 11:58 PM   Specimen: Nasal Mucosa; Nasal Swab  Result Value Ref Range Status   MRSA by PCR Next Gen NOT DETECTED NOT DETECTED Final    Comment: (  NOTE) The GeneXpert MRSA Assay (FDA approved for NASAL specimens only), is one component of a comprehensive  MRSA colonization surveillance program. It is not intended to diagnose MRSA infection nor to guide or monitor treatment for MRSA infections. Test performance is not FDA approved in patients less than 64 years old. Performed at [facility], [address], [place], [place] [address]   Culture, blood (Routine X 2) w Reflex to ID Panel     Status: None (Preliminary result)   Collection Time: 01/03/22 12:49 PM   Specimen: BLOOD  Result Value Ref Range Status   Specimen Description BLOOD BLOOD LEFT ARM  Final   Special Requests   Final    BLOOD BOTTLES DRAWN AEROBIC AND ANAEROBIC Blood Culture adequate volume BLOOD RIGHT HAND Performed at [facility], [address], [place], [place] [address]    Culture PENDING  Incomplete   Report Status PENDING  Incomplete  Culture, blood (Routine X 2) w Reflex to ID Panel     Status: None (Preliminary result)   Collection Time: 01/03/22 12:49 PM   Specimen: BLOOD  Result Value Ref Range Status   Specimen Description BLOOD  Final   Special Requests   Final    BLOOD BOTTLES DRAWN AEROBIC AND ANAEROBIC Blood Culture adequate volume BLOOD RIGHT HAND Performed at [facility], [address], [place], [place] [address]    Culture PENDING  Incomplete   Report Status PENDING  Incomplete    Radiology Studies: CT CHEST WO CONTRAST  Result Date: 01/01/2022 CLINICAL DATA:  Acute hypoxic respiratory failure EXAM: CT CHEST WITHOUT CONTRAST TECHNIQUE: Multidetector CT imaging of the chest was performed following the standard protocol without IV contrast. RADIATION DOSE REDUCTION: This exam was performed according to the departmental dose-optimization program which includes automated exposure control, adjustment of the mA and/or kV according to patient size and/or use of iterative reconstruction technique. COMPARISON:  Chest radiograph dated 01/01/2022 FINDINGS: Cardiovascular: Heart is normal in size.  No pericardial effusion. No evidence thoracic aortic aneurysm.  Atherosclerotic calcifications of the aortic arch. Three vessel coronary atherosclerosis. Mediastinum/Nodes: No suspicious mediastinal lymphadenopathy. Visualized thyroid is unremarkable. Lungs/Pleura: Evaluation of the lung parenchyma is constrained by respiratory motion. Peribronchovascular nodularity in the right upper lobe, central right middle lobe, and superior segment right lower lobe. Additional patchy left lower lobe opacity. These findings are compatible with multifocal pneumonia, although mild superimposed left lower lobe atelectasis is possible. Underlying mild centrilobular emphysematous changes are suspected, although poorly evaluated due to motion degradation. No pleural effusion or pneumothorax. Upper Abdomen: Visualized upper abdomen is grossly unremarkable, noting vascular calcifications. Musculoskeletal: Degenerative changes of the visualized thoracolumbar spine. IMPRESSION: Motion degraded images. Multifocal pneumonia, left lower lobe predominant. Aortic Atherosclerosis (ICD10-I70.0) and Emphysema (ICD10-J43.9). Electronically Signed   By: Charline Bills M.D.   On: 01/01/2022 23:11   CT Head Wo Contrast  Result Date: 01/01/2022 CLINICAL DATA:  Delirium EXAM: CT HEAD WITHOUT CONTRAST TECHNIQUE: Contiguous axial images were obtained from the base of the skull through the vertex without intravenous contrast. RADIATION DOSE REDUCTION: This exam was performed according to the departmental dose-optimization program which includes automated exposure control, adjustment of the mA and/or kV according to patient size and/or use of iterative reconstruction technique. COMPARISON:  08/30/2020 FINDINGS: Brain: Normal anatomic configuration. Parenchymal volume loss is commensurate with the patient's age. Moderate periventricular white matter changes are present likely reflecting the sequela of small vessel ischemia. Remote lacunar infarcts within the carina radiology and thalami bilaterally are  identified  with remote appearing infarcts within the left thalamus and posterior left corona radiata new in the interval since prior examination. No abnormal intra or extra-axial mass lesion or fluid collection. No abnormal mass effect or midline shift. No evidence of acute intracranial hemorrhage or infarct. Ventricular size is normal. Cerebellum unremarkable. Vascular: No asymmetric hyperdense vasculature at the skull base. Skull: Intact Sinuses/Orbits: Paranasal sinuses are clear. Orbits are unremarkable. Other: Fluid is seen within the left mastoid air cells without superimposed osseous erosion. Middle ear cavities and right mastoid air cells are clear. IMPRESSION: 1. No acute intracranial hemorrhage or infarct. 2. Moderate senescent change. 3. Remote appearing infarcts within the left thalamus and posterior left corona radiata, new in the interval since prior examination. 4. Left mastoid effusion. Electronically Signed   By: Fidela Salisbury M.D.   On: 01/01/2022 21:22   DG Chest Port 1 View  Result Date: 01/01/2022 CLINICAL DATA:  Respiratory distress EXAM: PORTABLE CHEST 1 VIEW COMPARISON:  02/14/2018 FINDINGS: Mild bronchitic changes. No acute airspace disease, pleural effusion or pneumothorax. Stable cardiomediastinal silhouette. Aortic atherosclerosis IMPRESSION: Bronchitic changes without focal airspace disease Electronically Signed   By: Donavan Foil M.D.   On: 01/01/2022 18:40     Scheduled Meds:  aspirin EC  81 mg Oral Q breakfast   atorvastatin  40 mg Oral Daily   Chlorhexidine Gluconate Cloth  6 each Topical Daily   heparin  5,000 Units Subcutaneous Q8H   insulin aspart  0-9 Units Subcutaneous Q4H   potassium chloride  40 mEq Oral Once   Continuous Infusions:  sodium chloride 125 mL/hr at 01/03/22 1501   azithromycin Stopped (01/03/22 1209)   ceFEPime (MAXIPIME) IV Stopped (01/03/22 1030)   sodium bicarbonate 75 mEq in sodium chloride 0.45 % 1,075 mL infusion 10 mL/hr at 01/03/22  1501    LOS: 2 days   Roxan Hockey M.D on 01/03/2022 at 3:27 PM  Go to [email or web address] - for contact info  [facility] - Office  [phone]  If 7PM-7AM, please contact night-coverage [email or web address] 01/03/2022, 3:27 PM

## 2022-01-03 NOTE — Progress Notes (Addendum)
Pharmacy Antibiotic Note  Frank Luna is a 60 y.o. male admitted on 01/01/2022 with sepsis.  Pharmacy has been consulted for  cefepime dosing.  Pt w/ AKI; baseline SCr ~1.1, current 1.98, has improved to baseline 1.14  Plan: Change   Cefepime 2g IV Q8H. F/U cxs and clinical progress Monitor V/S, labs   Height: 5\' 7"  (170.2 cm) Weight: 62.3 kg (137 lb 5.6 oz) IBW/kg (Calculated) : 66.1  Temp (24hrs), Avg:98.3 F (36.8 C), Min:97.4 F (36.3 C), Max:99 F (37.2 C)  Recent Labs  Lab 01/01/22 1802 01/01/22 2230 01/02/22 0259 01/02/22 1516 01/03/22 0448  WBC 19.3*  --  18.1*  --  14.9*  CREATININE 1.98*  --  2.02* 1.53* 1.14  LATICACIDVEN  --  5.4* 6.1*  --   --      Estimated Creatinine Clearance: 60 mL/min (by C-G formula based on SCr of 1.14 mg/dL).    Allergies  Allergen Reactions   Penicillins Rash    Rash per care everywhere   Antibiotics: Vancomycin 11/15>>11/16 Cefepime 11/15>. Flagyl 11/15> 115 Azithromycin 11/15   Cultures: 11/14 BCX: ngtd 11/14 MRSA PCR is neg  Thank you for allowing pharmacy to be a part of this patient's care.  12/14, BS Pharm D, BCPS Clinical Pharmacist 01/03/2022 10:56 AM

## 2022-01-03 NOTE — Evaluation (Signed)
Clinical/Bedside Swallow Evaluation Patient Details  Name: Frank Luna MRN: [id number] Date of Birth: Nov 06, 1960  Today's Date: 01/03/2022 Time: SLP Start Time (ACUTE ONLY): 1001 SLP Stop Time (ACUTE ONLY): 1042 SLP Time Calculation (min) (ACUTE ONLY): 41 min  Past Medical History:  Past Medical History:  Diagnosis Date   Diabetes (HCC)    Hypertension    Stroke (HCC)    Thrombocytopenia (HCC)    Past Surgical History: History reviewed. No pertinent surgical history. HPI:  61 y.o. male with medical history significant for diabetes mellitus, stroke, hypertension  Admitted on 01/01/2022 with sepsis secondary to sepsis secondary to multifocal pneumonia left more than right with acute hypoxic respiratory failure requiring up to 15 L of oxygen on nonrebreather bag initially as well as acute metabolic encephalopathy and AKI. H&P indicate dysphagia and cognitive/speech impairment since CVAs from July of 2023 however unable to find hx of ST treatment for either in care everywhere; attempted to contact Pt's wife via phone without success. BSE requested    Assessment / Plan / Recommendation  Clinical Impression  Clinical swallowing evaluation completed while Pt was sitting upright in bed; Baseline severity of dysphagia is unknown as SLP was unable to reach Pt's wife and there are no ST notes accessible in care everywhere. Pt's presentation today is significantly impacted by mental status. RN and SLP repositioned Pt to upright and Pt with eyes open but no notable responses despite max cues. No verbalizations, no gestures and no eye gaze shifting with cues or auditory input. SLP provided oral care and note dried secretions and missing dentition/poor condition. SLP placed a single ice chip in Pt's oral cavity, with max verbal cues and visual cues note NO oral manipulation, despite eyes being open, ice chip fell from oral cavity. SLP attempted two tsp bites of ice cream, Pt again with no response and bolus  was removed from oral cavity. Pt is at high risk for aspiration and dehydration/malnutrition. Recommend NPO status, ST will continue to follow for diet advancement and goals of care. Thank you, SLP Visit Diagnosis: Dysphagia, unspecified (R13.10)    Aspiration Risk  Severe aspiration risk;Risk for inadequate nutrition/hydration    Diet Recommendation NPO   Medication Administration: Via alternative means    Other  Recommendations Oral Care Recommendations: Oral care QID Other Recommendations: Have oral suction available;Remove water pitcher    Recommendations for follow up therapy are one component of a multi-disciplinary discharge planning process, led by the attending physician.  Recommendations may be updated based on patient status, additional functional criteria and insurance authorization.           Frequency and Duration min 2x/week  1 week       Prognosis Barriers to Reach Goals: Severity of deficits      Swallow Study   General HPI: 61 y.o. male with medical history significant for diabetes mellitus, stroke, hypertension  Admitted on 01/01/2022 with sepsis secondary to sepsis secondary to multifocal pneumonia left more than right with acute hypoxic respiratory failure requiring up to 15 L of oxygen on nonrebreather bag initially as well as acute metabolic encephalopathy and AKI. H&P indicate dysphagia and cognitive/speech impairment since CVAs from July of 2023 however unable to find hx of ST treatment for either in care everywhere; attempted to contact Pt's wife via phone without success. BSE requested Type of Study: Bedside Swallow Evaluation Previous Swallow Assessment: Last swallow eval in our system 2022 Diet Prior to this Study: Thin liquids Temperature Spikes Noted: No Respiratory Status:  Room air History of Recent Intubation: No Behavior/Cognition: Lethargic/Drowsy Oral Cavity Assessment: Dry;Dried secretions Oral Care Completed by SLP: Yes Oral Cavity -  Dentition: Missing dentition;Poor condition Patient Positioning: Upright in bed Baseline Vocal Quality: Not observed Volitional Cough: Cognitively unable to elicit Volitional Swallow: Unable to elicit    Oral/Motor/Sensory Function Overall Oral Motor/Sensory Function: Within functional limits   Ice Chips Ice chips: Impaired Presentation: Spoon Oral Phase Impairments: Reduced labial seal;Reduced lingual movement/coordination;Impaired mastication;Poor awareness of bolus Oral Phase Functional Implications: Right anterior spillage;Left anterior spillage;Oral holding   Thin Liquid Thin Liquid: Not tested    Nectar Thick Nectar Thick Liquid: Not tested   Honey Thick Honey Thick Liquid: Not tested   Puree Puree: Not tested   Solid     Solid: Not tested     Javaria Knapke H. Romie Levee, CCC-SLP Speech Language Pathologist  [name] 01/03/2022,10:42 AM

## 2022-01-03 NOTE — Plan of Care (Signed)

## 2022-01-04 ENCOUNTER — Inpatient Hospital Stay (HOSPITAL_COMMUNITY): Payer: Medicaid Other

## 2022-01-04 DIAGNOSIS — J9601 Acute respiratory failure with hypoxia: Secondary | ICD-10-CM | POA: Diagnosis not present

## 2022-01-04 LAB — RENAL FUNCTION PANEL
Albumin: 2.5 g/dL — ABNORMAL LOW (ref 3.5–5.0)
Anion gap: 5 (ref 5–15)
BUN: 39 mg/dL — ABNORMAL HIGH (ref 8–23)
CO2: 20 mmol/L — ABNORMAL LOW (ref 22–32)
Calcium: 8.4 mg/dL — ABNORMAL LOW (ref 8.9–10.3)
Chloride: 123 mmol/L — ABNORMAL HIGH (ref 98–111)
Creatinine, Ser: 0.9 mg/dL (ref 0.61–1.24)
GFR, Estimated: >60 mL/min (ref >60)
Glucose, Bld: 97 mg/dL (ref 70–99)
Phosphorus: 1.6 mg/dL — ABNORMAL LOW (ref 2.5–4.6)
Potassium: 3.3 mmol/L — ABNORMAL LOW (ref 3.5–5.1)
Sodium: 148 mmol/L — ABNORMAL HIGH (ref 135–145)

## 2022-01-04 LAB — GLUCOSE, CAPILLARY
Glucose-Capillary: 116 mg/dL — ABNORMAL HIGH (ref 70–99)
Glucose-Capillary: 131 mg/dL — ABNORMAL HIGH (ref 70–99)
Glucose-Capillary: 148 mg/dL — ABNORMAL HIGH (ref 70–99)
Glucose-Capillary: 88 mg/dL (ref 70–99)
Glucose-Capillary: 94 mg/dL (ref 70–99)
Glucose-Capillary: 96 mg/dL (ref 70–99)

## 2022-01-04 LAB — CBC
HCT: 34.7 % — ABNORMAL LOW (ref 39.0–52.0)
Hemoglobin: 11.2 g/dL — ABNORMAL LOW (ref 13.0–17.0)
MCH: 30 pg (ref 26.0–34.0)
MCHC: 32.3 g/dL (ref 30.0–36.0)
MCV: 93 fL (ref 80.0–100.0)
Platelets: 88 10*3/uL — ABNORMAL LOW (ref 150–400)
RBC: 3.73 MIL/uL — ABNORMAL LOW (ref 4.22–5.81)
RDW: 14.8 % (ref 11.5–15.5)
WBC: 13.9 10*3/uL — ABNORMAL HIGH (ref 4.0–10.5)
nRBC: 0 % (ref 0.0–0.2)

## 2022-01-04 LAB — MAGNESIUM: Magnesium: 2 mg/dL (ref 1.7–2.4)

## 2022-01-04 MED ORDER — SODIUM CHLORIDE 0.9 % IV SOLN
2.0000 g | INTRAVENOUS | Status: DC
  Administered 2022-01-04 – 2022-01-07 (×4): 2 g via INTRAVENOUS
  Filled 2022-01-04 (×4): qty 20

## 2022-01-04 MED ORDER — POTASSIUM CHLORIDE 10 MEQ/100ML IV SOLN
10.0000 meq | INTRAVENOUS | Status: AC
  Administered 2022-01-04 (×2): 10 meq via INTRAVENOUS
  Filled 2022-01-04 (×2): qty 100

## 2022-01-04 MED ORDER — ORAL CARE MOUTH RINSE
15.0000 mL | OROMUCOSAL | Status: DC
  Administered 2022-01-04 – 2022-01-07 (×13): 15 mL via OROMUCOSAL

## 2022-01-04 MED ORDER — POTASSIUM CHLORIDE 10 MEQ/100ML IV SOLN
10.0000 meq | INTRAVENOUS | Status: AC
  Administered 2022-01-04 (×4): 10 meq via INTRAVENOUS
  Filled 2022-01-04: qty 100

## 2022-01-04 MED ORDER — ORAL CARE MOUTH RINSE: 15.0000 mL | OROMUCOSAL | Status: DC | PRN

## 2022-01-04 MED ORDER — POTASSIUM PHOSPHATES 15 MMOLE/5ML IV SOLN
30.0000 mmol | Freq: Once | INTRAVENOUS | Status: DC
  Filled 2022-01-04: qty 10

## 2022-01-04 MED ORDER — POTASSIUM PHOSPHATES 15 MMOLE/5ML IV SOLN
30.0000 mmol | Freq: Once | INTRAVENOUS | Status: AC
  Administered 2022-01-04: 30 mmol via INTRAVENOUS
  Filled 2022-01-04: qty 10

## 2022-01-04 NOTE — Progress Notes (Signed)
PROGRESS NOTE     Frank Luna, is a 61 y.o. male, DOB - Oct 20, 1960, ZOX:[id number]  Admit date - 01/01/2022   Admitting Physician Ejiroghene Wendall Stade, MD  Outpatient Primary MD for the patient is Graylin Shiver [facility]  LOS - 3  Chief Complaint  Patient presents with   Shortness of Breath        Brief Narrative:  61 y.o. male with medical history significant for diabetes mellitus, stroke, hypertension  Admitted on 01/01/2022 with sepsis secondary to sepsis secondary to multifocal pneumonia left more than right with acute hypoxic respiratory failure requiring up to 15 L of oxygen on nonrebreather bag initially as well as acute metabolic encephalopathy and AKI    -Assessment and Plan: 1)Acute respiratory failure with Hypoxia  secondary to multifocal pneumonia left more than right with acute hypoxic respiratory failure requiring up to 15 L of oxygen on nonrebreather bag initially--- -Oxygen Weaned off on 01/03/22 -CT chest without contrast with multifocal pneumonia mostly on the left side -Added Azithromycin on 01/02/22 -Treated with cefepime , de-escalate to Rocephin on 01/04/2022 -Stopped Vancomycin on 01/03/22 --Continue bronchodilators and mucolytics -Hypoxia has resolved  2)Sepsis secondary to multifocal pneumonia left more than right with acute hypoxic respiratory failure  WBC 19.3 >>18.1>>14.9>>13.9 PCT 0.24 >>0.32>>0.42 -Lactic Acid 5.4 >> 6.1  -Management as above #1 -Additional IV fluids as ordered  3) persistent fevers--- presumed UTI UA on 01/03/2022 suggestive of UTI, urine culture pending -Continue cefepime as above #1  3)Dehydration/Hypernatremia---Na 147 >> 152---due to poor oral intake and sepsis  IVF adjusted -Monitor serial BMP, adjust IV fluid consistency and rate as indicated   4)AKI (acute kidney injury) (HCC) On admission Cr elevated at 1.98, BUN of 69.  -As per Care Everywhere creatinine was 1.0 on 10/24/2021 -Creatinine and renal function  appears to have normalized with hydration -IV fluids adjusted as above -renally adjust medications, avoid nephrotoxic agents / dehydration  / hypotension -Hold lisinopril  5)Recurrent Strokes----MRI Brain from 09/27/21 showed Acute perforator infarct involving the left basal ganglia,  Evolving subacute infarct in the right corona radiata, Remote lacunar infarcts, chronic microvascular ischemic disease and cerebral atrophy -Patient had speech, swallowing, cognitive deficits as well as left-sided hemiparesis--- since July 2023 got worse in August 2023 despite rehab--he has been unable to make significant improvement -Per patient's mother patient has had moderate memory and cognitive deficits since his strokes over the summer -CT head at this time and neuro exam at this time failed to demonstrate significant concerns for new additional strokes  6)Acute metabolic encephalopathy--secondary to sepsis/AKI/hypoxia compounded with underlying strokes -Seroquel discontinued -Treat above etiologies  7)Elevated troponin---Sepsis and hypoxia related demand ischemia  Mild troponin elevation -troponin 34 > 35.  EKG with new diffuse T wave inversions in lead to 3 aVF V3 through V6.    8)DM2-  A1C is 6.5 reflecting good diabetic control PTA -Hold home Lantus, glipizide, Farxiga Use Novolog/Humalog Sliding scale insulin with Accu-Cheks/Fingersticks as ordered   9)HTN Okay to restart metoprolol and amlodipine when able to take oral meds -continue to hold lisinopril due to dehydration and AKI - may use IV Hydralazine 10 mg  Every 4 hours Prn for systolic blood pressure over 160 mmhg  10)Social/Ethics--- plan of care discussed with patient's mother who is at bedside -Patient is legally married but he has been separated from his wife since April 2023 -He lives with his mother since April 2023, suffered recurrent strokes in July and August 2023 and ended up being admitted to  SNF facility for rehab since August  2023 -Patient wife [name] defer decision making to patient's mom [name] -Patient has a stepdaughter [name] -Patient remains a DNR/DNI -Given lack of neurological and neuromuscular improvement from strokes since July/August 2023, overall prognosis for full recovery from 2 weeks recurrent stroke related deficits Not very encouraging -Patient's mother [name]... Indicates that she probably would not want a feeding tube -If patient remains unable to safely take oral intake may have to consider transition to palliative/comfort care  11)Hypernatremia------Na 147 >> 152>>> 144>> 149>>148 -Suspect free water deficit -Patient is n.p.o. due to swallowing difficulties -He is diabetic so avoid dextrose -c/n 1/2 NS iv   12)Hypokalemia/hypophosphatemia --- replace and recheck, magnesium WNL  13)Dysphagia--- history of multiple strokes now some altered mentation due to acute illness, with increased aspiration risk- --speech eval appreciated recommends n.p.o. status for now   Disposition/Need for in-Hospital Stay- patient unable to be discharged at this time due to -sepsis secondary to multifocal pneumonia with acute hypoxic respiratory failure dehydration AKI requiring oxygen supplementation, IV antibiotics and IV fluids-  Status is: Inpatient   Disposition: The patient is from: SNF              Anticipated d/c is to: SNF              Anticipated d/c date is: 2 days              Patient currently is not medically stable to d/c. Barriers: Not Clinically Stable-   Code Status :  -  Code Status: DNR   Family Communication:  Discussed with patient's mother who is at bedside DVT Prophylaxis  :   - SCDs   heparin injection 5,000 Units Start: 01/02/22 0600  Lab Results  Component Value Date   PLT 88 (L) 01/04/2022   Inpatient Medications  Scheduled Meds:  aspirin EC  81 mg Oral Q breakfast   atorvastatin  40 mg Oral Daily   Chlorhexidine Gluconate Cloth  6 each Topical Daily    heparin  5,000 Units Subcutaneous Q8H   insulin aspart  0-9 Units Subcutaneous Q4H   mouth rinse  15 mL Mouth Rinse 4 times per day   Continuous Infusions:  0.45 % NaCl with KCl 20 mEq / L Stopped (01/04/22 1004)   azithromycin 500 mg (01/04/22 1039)   ceFEPime (MAXIPIME) IV Stopped (01/04/22 0928)   potassium chloride 100 mL/hr at 01/04/22 1034   potassium PHOSPHATE IVPB (in mmol) 85 mL/hr at 01/04/22 1034   sodium bicarbonate 75 mEq in sodium chloride 0.45 % 1,075 mL infusion 10 mL/hr at 01/04/22 1034   PRN Meds:.acetaminophen **OR** acetaminophen, albuterol, ondansetron **OR** ondansetron (ZOFRAN) IV, mouth rinse   Anti-infectives (From admission, onward)    Start     Dose/Rate Route Frequency Ordered Stop   01/03/22 1000  ceFEPIme (MAXIPIME) 2 g in sodium chloride 0.9 % 100 mL IVPB        2 g 200 mL/hr over 30 Minutes Intravenous Every 8 hours 01/03/22 0922     01/03/22 0200  vancomycin (VANCOREADY) IVPB 750 mg/150 mL  Status:  Discontinued        750 mg 150 mL/hr over 60 Minutes Intravenous Every 24 hours 01/02/22 1136 01/03/22 1029   01/02/22 1130  azithromycin (ZITHROMAX) 500 mg in sodium chloride 0.9 % 250 mL IVPB        500 mg 250 mL/hr over 60 Minutes Intravenous Every 24 hours 01/02/22 1038  01/02/22 1000  ceFEPIme (MAXIPIME) 2 g in sodium chloride 0.9 % 100 mL IVPB  Status:  Discontinued        2 g 200 mL/hr over 30 Minutes Intravenous Every 12 hours 01/02/22 0109 01/03/22 0922   01/02/22 0109  vancomycin variable dose per unstable renal function (pharmacist dosing)  Status:  Discontinued         Does not apply See admin instructions 01/02/22 0109 01/02/22 1136   01/01/22 2330  ceFEPIme (MAXIPIME) 2 g in sodium chloride 0.9 % 100 mL IVPB        2 g 200 mL/hr over 30 Minutes Intravenous  Once 01/01/22 2320 01/02/22 0314   01/01/22 2315  vancomycin (VANCOREADY) IVPB 2000 mg/400 mL        2,000 mg 200 mL/hr over 120 Minutes Intravenous  Once 01/01/22 2306 01/02/22  0243   01/01/22 2245  metroNIDAZOLE (FLAGYL) IVPB 500 mg  Status:  Discontinued        500 mg 100 mL/hr over 60 Minutes Intravenous Every 12 hours 01/01/22 2235 01/02/22 1038       Subjective: [name] today has no fevers, no emesis,  No chest pain,   - -No significant cough -Sleepy on and off Swallowing difficulties persist--  Objective: Vitals:   01/04/22 0906 01/04/22 0907 01/04/22 1000 01/04/22 1029  BP: (!) 140/69  (!) 159/90   Pulse: 92 93 99 84  Resp: Temp:      TempSrc:      SpO2: 95% 93% 94% 95%  Weight:      Height:        Intake/Output Summary (Last 24 hours) at 01/04/2022 1049 Last data filed at 01/04/2022 1034 Gross per 24 hour  Intake 5261.99 ml  Output 850 ml  Net 4411.99 ml   Filed Weights   01/02/22 0012 01/03/22 0439 01/04/22 0429  Weight: 61.5 kg 62.3 kg 65.7 kg   Physical Exam  Gen:-Sleepy on and off, wakes up to voice, no acute distress HEENT:- [place].AT, No sclera icterus Nose--weaned off Oxygen on 01/03/22 Neck-Supple Neck,No JVD,.  Lungs-improving air movement, no wheezing  CV- S1, S2 normal, regular  Abd-  +ve B.Sounds, Abd Soft, No tenderness,    Extremity/Skin:- No  edema, pedal pulses present  Psych-moderate memory and cognitive deficits since his strokes over the summer Neuro-residual left-sided hemiparesis with mild Lt UE contracture which is not new, no additional new focal deficits, no tremors  Data Reviewed: I have personally reviewed following labs and imaging studies  CBC: Recent Labs  Lab 01/01/22 1802 01/02/22 0259 01/03/22 0448 01/04/22 0221  WBC 19.3* 18.1* 14.9* 13.9*  NEUTROABS 16.9*  --   --   --   HGB 17.3* 14.7 11.9* 11.2*  HCT 52.2* 46.7 36.0* 34.7*  MCV 93.2 95.9 92.8 93.0  PLT 163 126* 89* 88*   Basic Metabolic Panel: Recent Labs  Lab 01/01/22 1802 01/02/22 0259 01/02/22 1516 01/03/22 0448 01/04/22 0221  NA 147* 152* 144 149* 148*  K 5.0 4.6 3.2* 2.8* 3.3*  CL 114* 115* 118* 122*  123*  CO2 21* 21* 19* 21* 20*  GLUCOSE 216* 155* 128* 101* 97  BUN 69* 68* 62* 50* 39*  CREATININE 1.98* 2.02* 1.53* 1.14 0.90  CALCIUM 10.1 9.4 8.4* 8.5* 8.4*  MG 2.6*  --   --   --  2.0  PHOS  --   --  2.7  --  1.6*   GFR: Estimated Creatinine Clearance: 80.1  mL/min (by C-G formula based on SCr of 0.9 mg/dL). Recent Labs    01/02/22 0300  HGBA1C 6.5*   Recent Results (from the past 240 hour(s))  Resp Panel by RT-PCR (Flu A&B, Covid) Anterior Nasal Swab     Status: None   Collection Time: 01/01/22  6:09 PM   Specimen: Anterior Nasal Swab  Result Value Ref Range Status   SARS Coronavirus 2 by RT PCR NEGATIVE NEGATIVE Final    Comment: (NOTE) SARS-CoV-2 target nucleic acids are NOT DETECTED.  The SARS-CoV-2 RNA is generally detectable in upper respiratory specimens during the acute phase of infection. The lowest concentration of SARS-CoV-2 viral copies this assay can detect is 138 copies/mL. A negative result does not preclude SARS-Cov-2 infection and should not be used as the sole basis for treatment or other patient management decisions. A negative result may occur with  improper specimen collection/handling, submission of specimen other than nasopharyngeal swab, presence of viral mutation(s) within the areas targeted by this assay, and inadequate number of viral copies(<138 copies/mL). A negative result must be combined with clinical observations, patient history, and epidemiological information. The expected result is Negative.  Fact Sheet for Patients:  BloggerCourse.comhttps://www.fda.gov/media/152166/download  Fact Sheet for Healthcare Providers:  SeriousBroker.ithttps://www.fda.gov/media/152162/download  This test is no t yet approved or cleared by the [place] FDA and  has been authorized for detection and/or diagnosis of SARS-CoV-2 by FDA under an Emergency Use Authorization (EUA). This EUA will remain  in effect (meaning this test can be used) for the duration of the COVID-19  declaration under Section 564(b)(1) of the Act, 21 U.S.C.section 360bbb-3(b)(1), unless the authorization is terminated  or revoked sooner.       Influenza A by PCR NEGATIVE NEGATIVE Final   Influenza B by PCR NEGATIVE NEGATIVE Final    Comment: (NOTE) The Xpert Xpress SARS-CoV-2/FLU/RSV plus assay is intended as an aid in the diagnosis of influenza from Nasopharyngeal swab specimens and should not be used as a sole basis for treatment. Nasal washings and aspirates are unacceptable for Xpert Xpress SARS-CoV-2/FLU/RSV testing.  Fact Sheet for Patients: BloggerCourse.comhttps://www.fda.gov/media/152166/download  Fact Sheet for Healthcare Providers: SeriousBroker.ithttps://www.fda.gov/media/152162/download  This test is not yet approved or cleared by the [place] FDA and has been authorized for detection and/or diagnosis of SARS-CoV-2 by FDA under an Emergency Use Authorization (EUA). This EUA will remain in effect (meaning this test can be used) for the duration of the COVID-19 declaration under Section 564(b)(1) of the Act, 21 U.S.C. section 360bbb-3(b)(1), unless the authorization is terminated or revoked.  Performed at [facility], [address] [place], [place] [address]   Culture, blood (Routine X 2) w Reflex to ID Panel     Status: None (Preliminary result)   Collection Time: 01/01/22 10:29 PM   Specimen: Right Antecubital; Blood  Result Value Ref Range Status   Specimen Description RIGHT ANTECUBITAL  Final   Special Requests   Final    BOTTLES DRAWN AEROBIC AND ANAEROBIC Blood Culture adequate volume   Culture   Final    NO GROWTH 3 DAYS Performed at [facility], [address] [place], [place] [address]    Report Status PENDING  Incomplete  Culture, blood (Routine X 2) w Reflex to ID Panel     Status: None (Preliminary result)   Collection Time: 01/01/22 10:29 PM   Specimen: BLOOD RIGHT HAND  Result Value Ref Range Status   Specimen Description BLOOD RIGHT HAND  Final   Special Requests  Final    BOTTLES DRAWN AEROBIC AND ANAEROBIC Blood Culture results may not be optimal due to an inadequate volume of blood received in culture bottles   Culture   Final    NO GROWTH 3 DAYS Performed at [facility], [address], [place], [place] [address]    Report Status PENDING  Incomplete  MRSA Next Gen by PCR, Nasal     Status: None   Collection Time: 01/01/22 11:58 PM   Specimen: Nasal Mucosa; Nasal Swab  Result Value Ref Range Status   MRSA by PCR Next Gen NOT DETECTED NOT DETECTED Final    Comment: (NOTE) The GeneXpert MRSA Assay (FDA approved for NASAL specimens only), is one component of a comprehensive MRSA colonization surveillance program. It is not intended to diagnose MRSA infection nor to guide or monitor treatment for MRSA infections. Test performance is not FDA approved in patients less than 37 years old. Performed at [facility], [address] [place], [place] [address]   Culture, blood (Routine X 2) w Reflex to ID Panel     Status: None (Preliminary result)   Collection Time: 01/03/22 12:49 PM   Specimen: BLOOD  Result Value Ref Range Status   Specimen Description BLOOD BLOOD LEFT ARM  Final   Special Requests   Final    BLOOD BOTTLES DRAWN AEROBIC AND ANAEROBIC Blood Culture adequate volume BLOOD RIGHT HAND   Culture   Final    NO GROWTH < 24 HOURS Performed at [facility], [address] [place], [place] [address]    Report Status PENDING  Incomplete  Culture, blood (Routine X 2) w Reflex to ID Panel     Status: None (Preliminary result)   Collection Time: 01/03/22 12:49 PM   Specimen: BLOOD  Result Value Ref Range Status   Specimen Description BLOOD  Final   Special Requests   Final    BLOOD BOTTLES DRAWN AEROBIC AND ANAEROBIC Blood Culture adequate volume BLOOD RIGHT HAND   Culture   Final    NO GROWTH < 24 HOURS Performed at [facility], [address], [place], [place] [address]    Report Status PENDING  Incomplete    Radiology Studies: DG  CHEST PORT 1 VIEW  Result Date: 01/04/2022 CLINICAL DATA:  [id number] with dyspnea and respiratory abnormalities. Follow-up pneumonia and sepsis. EXAM: PORTABLE CHEST 1 VIEW COMPARISON:  Portable chest and chest CT both 01/01/2022 FINDINGS: 4:56 a.m. There is interval worsening of patchy airspace disease in the left lower lobe. There was faint peribronchovascular nodularity on CT in the right upper and middle lobes but it is not well redemonstrated radiographically if it is still present. The remaining lungs appear generally clear. There is overlying monitor wiring. Heart size and vasculature are normal and no pleural effusion is seen. There is calcification in the aortic arch. Stable mediastinum.  Osteopenia and thoracic spondylosis. IMPRESSION: 1. Interval worsening of patchy airspace disease in the left lower lobe. 2. Faint peribronchovascular nodularity on CT in the right upper and middle lobes is not well redemonstrated radiographically if it is still present. The rest of the lungs are generally clear. Electronically Signed   By: Almira Bar M.D.   On: 01/04/2022 06:03     Scheduled Meds:  aspirin EC  81 mg Oral Q breakfast   atorvastatin  40 mg Oral Daily   Chlorhexidine Gluconate Cloth  6 each Topical Daily   heparin  5,000 Units Subcutaneous Q8H   insulin aspart  0-9 Units Subcutaneous  Q4H   mouth rinse  15 mL Mouth Rinse 4 times per day   Continuous Infusions:  0.45 % NaCl with KCl 20 mEq / L Stopped (01/04/22 1004)   azithromycin 500 mg (01/04/22 1039)   ceFEPime (MAXIPIME) IV Stopped (01/04/22 0928)   potassium chloride 100 mL/hr at 01/04/22 1034   potassium PHOSPHATE IVPB (in mmol) 85 mL/hr at 01/04/22 1034   sodium bicarbonate 75 mEq in sodium chloride 0.45 % 1,075 mL infusion 10 mL/hr at 01/04/22 1034    LOS: 3 days   Shon Hale M.D on 01/04/2022 at 10:49 AM  Go to [email or web address] - for contact info  [facility] - Office  [phone]  If 7PM-7AM, please contact  night-coverage [email or web address] 01/04/2022, 10:49 AM

## 2022-01-04 NOTE — Progress Notes (Signed)
Speech Language Pathology Treatment: Dysphagia  Patient Details Name: Frank Luna MRN: [id number] DOB: 1960-12-25 Today's Date: 01/04/2022 Time: 0840-0900 SLP Time Calculation (min) (ACUTE ONLY): 20 min  Assessment / Plan / Recommendation Clinical Impression  Ongoing diagnostic dysphagia therapy provided this morning. Pt was alert after RN and SLP repositioned Pt in bed and he did verbally respond saying "hey" X1. SLP provided a single ice chip and note no oral response and ice chip eventually fell from oral cavity. SLP provided a straw and attempted various methods of presentation with coke, however, still note no oral response or manipulation, inability to suck up the straw and all liquid placed in oral cavity anteriorly spills out despite max verbal and tactile cues. Continue to recommend NPO at this time. ST will continue efforts,    HPI HPI: 61 y.o. male with medical history significant for diabetes mellitus, stroke, hypertension  Admitted on 01/01/2022 with sepsis secondary to sepsis secondary to multifocal pneumonia left more than right with acute hypoxic respiratory failure requiring up to 15 L of oxygen on nonrebreather bag initially as well as acute metabolic encephalopathy and AKI. H&P indicate dysphagia and cognitive/speech impairment since CVAs from July of 2023 however unable to find hx of ST treatment for either in care everywhere; attempted to contact Pt's wife via phone without success. BSE requested         Recommendations for follow up therapy are one component of a multi-disciplinary discharge planning process, led by the attending physician.  Recommendations may be updated based on patient status, additional functional criteria and insurance authorization.    Recommendations  Diet recommendations: NPO Medication Administration: Via alternative means                           Keino Placencia H. Romie Levee, CCC-SLP Speech Language Pathologist  [name]  01/04/2022, 9:40 AM

## 2022-01-04 NOTE — Plan of Care (Signed)

## 2022-01-05 DIAGNOSIS — J9601 Acute respiratory failure with hypoxia: Secondary | ICD-10-CM | POA: Diagnosis not present

## 2022-01-05 LAB — URINE CULTURE: Culture: NO GROWTH

## 2022-01-05 LAB — CBC
HCT: 33.4 % — ABNORMAL LOW (ref 39.0–52.0)
Hemoglobin: 10.6 g/dL — ABNORMAL LOW (ref 13.0–17.0)
MCH: 30.3 pg (ref 26.0–34.0)
MCHC: 31.7 g/dL (ref 30.0–36.0)
MCV: 95.4 fL (ref 80.0–100.0)
Platelets: 83 10*3/uL — ABNORMAL LOW (ref 150–400)
RBC: 3.5 MIL/uL — ABNORMAL LOW (ref 4.22–5.81)
RDW: 15 % (ref 11.5–15.5)
WBC: 12.7 10*3/uL — ABNORMAL HIGH (ref 4.0–10.5)
nRBC: 0 % (ref 0.0–0.2)

## 2022-01-05 LAB — BASIC METABOLIC PANEL
Anion gap: 5 (ref 5–15)
BUN: 25 mg/dL — ABNORMAL HIGH (ref 8–23)
CO2: 21 mmol/L — ABNORMAL LOW (ref 22–32)
Calcium: 8.1 mg/dL — ABNORMAL LOW (ref 8.9–10.3)
Chloride: 121 mmol/L — ABNORMAL HIGH (ref 98–111)
Creatinine, Ser: 0.66 mg/dL (ref 0.61–1.24)
GFR, Estimated: >60 mL/min (ref >60)
Glucose, Bld: 98 mg/dL (ref 70–99)
Potassium: 3.5 mmol/L (ref 3.5–5.1)
Sodium: 147 mmol/L — ABNORMAL HIGH (ref 135–145)

## 2022-01-05 LAB — GLUCOSE, CAPILLARY
Glucose-Capillary: 111 mg/dL — ABNORMAL HIGH (ref 70–99)
Glucose-Capillary: 90 mg/dL (ref 70–99)
Glucose-Capillary: 90 mg/dL (ref 70–99)
Glucose-Capillary: 91 mg/dL (ref 70–99)
Glucose-Capillary: 94 mg/dL (ref 70–99)
Glucose-Capillary: 97 mg/dL (ref 70–99)

## 2022-01-05 MED ORDER — POTASSIUM CHLORIDE IN NACL 20-0.45 MEQ/L-% IV SOLN
INTRAVENOUS | Status: DC
  Filled 2022-01-05 (×5): qty 1000

## 2022-01-05 MED ORDER — SODIUM CHLORIDE 0.45 % IV SOLN
INTRAVENOUS | Status: DC
  Filled 2022-01-05 (×3): qty 1000

## 2022-01-05 MED ORDER — DEXTROSE-NACL 5-0.45 % IV SOLN: INTRAVENOUS | Status: DC

## 2022-01-05 NOTE — Progress Notes (Signed)
PROGRESS NOTE     Frank Luna, is a 61 y.o. male, DOB - [birth year], ZOX:[id number]  Admit date - 01/01/2022   Admitting Physician Ejiroghene Wendall Stade, MD  Outpatient Primary MD for the patient is Graylin Sh[facility]  LOS - 4  Chief Complaint  Patient presents with   Shortness of Breath        Brief Narrative:  61 y.o. male with medical history significant for diabetes mellitus, stroke, hypertension  Admitted on 01/01/2022 with sepsis secondary to sepsis secondary to multifocal pneumonia left more than right with acute hypoxic respiratory failure requiring up to 15 L of oxygen on nonrebreather bag initially as well as acute metabolic encephalopathy and AKI    -Assessment and Plan: 1)Acute respiratory failure with Hypoxia  secondary to multifocal pneumonia left more than right with acute hypoxic respiratory failure requiring up to 15 L of oxygen on nonrebreather bag initially--- -CT chest without contrast with multifocal pneumonia mostly on the left side -Added Azithromycin on 01/02/22 -Treated with cefepime ,  -de-escalated to Rocephin on 01/04/2022 -Stopped Vancomycin on 01/03/22 --Continue bronchodilators and mucolytics -Hypoxia has resolved --Oxygen Weaned off on 01/03/22  2)Sepsis secondary to multifocal pneumonia left more than right with acute hypoxic respiratory failure  WBC 19.3 >>18.1>>14.9>>13.9>>12.7 PCT 0.24 >>0.32>>0.42 -Lactic Acid 5.4 >> 6.1  -Management as above #1 -Urine and blood cultures NGTD  3)Fevers--- presumed UTI -No further fevers at this time UA on 01/03/2022 suggestive of UTI, urine culture pending -Continue Abx as above #1  3)Dehydration/Hypernatremia------due to poor oral intake and sepsis  --Patient is n.p.o. due to swallowing difficulties -IVF adjusted Na --trending down slowly -Monitor serial BMP, adjust IV fluid consistency and rate as indicated   4)AKI (acute kidney injury) (HCC) On admission Cr elevated at 1.98, BUN of  69.  -As per Care Everywhere creatinine was 1.0 on 10/24/2021 -Creatinine and renal function appears to have normalized with hydration -IV fluids adjusted as above -renally adjust medications, avoid nephrotoxic agents / dehydration  / hypotension -Hold lisinopril  5)Recurrent Strokes----MRI Brain from 09/27/21 showed Acute perforator infarct involving the left basal ganglia,  Evolving subacute infarct in the right corona radiata, Remote lacunar infarcts, chronic microvascular ischemic disease and cerebral atrophy -Patient had speech, swallowing, cognitive deficits as well as left-sided hemiparesis--- since July 2023 got worse in August 2023 despite rehab--he has been unable to make significant improvement -Per patient's mother patient has had moderate memory and cognitive deficits since his strokes over the summer -CT head at this time and neuro exam at this time failed to demonstrate significant concerns for new additional strokes  6)Acute metabolic encephalopathy--secondary to sepsis/AKI/hypoxia compounded with underlying strokes -Seroquel discontinued -Treat above etiologies  7)Elevated troponin---Sepsis and hypoxia related demand ischemia  Mild troponin elevation -troponin 34 > 35.  EKG with new diffuse T wave inversions in lead to 3 aVF V3 through V6.    8)DM2-  A1C is 6.5 reflecting good diabetic control PTA -Hold home Lantus, glipizide, Farxiga Use Novolog/Humalog Sliding scale insulin with Accu-Cheks/Fingersticks as ordered   9)HTN Okay to restart metoprolol and amlodipine when able to take oral meds -continue to hold lisinopril due to dehydration and AKI - may use IV Hydralazine 10 mg  Every 4 hours Prn for systolic blood pressure over 160 mmhg  10)Social/Ethics--- plan of care discussed with patient's mother who is at bedside -Patient is legally married but he has been separated from his wife since April 2023 -He lives with his mother since April 2023, suffered  recurrent strokes  in July and August 2023 and ended up being admitted to SNF facility for rehab since August 2023 -Patient wife [name] defer decision making to patient's mom [name] -Patient has a stepdaughter [name] -Patient remains a DNR/DNI -Given lack of neurological and neuromuscular improvement from strokes since July/August 2023, overall prognosis for full recovery from 2 weeks recurrent stroke related deficits Not very encouraging -Patient's mother [name]... Indicates that she probably would not want a feeding tube -If patient remains unable to safely take oral intake may have to consider transition to palliative/comfort care  11)Hypokalemia/hypophosphatemia --- replace and recheck, magnesium WNL  12)Dysphagia--- history of multiple strokes now some altered mentation due to acute illness, with increased aspiration risk- --speech eval appreciated recommends n.p.o. status for now   Disposition/Need for in-Hospital Stay- patient unable to be discharged at this time due to -sepsis secondary to multifocal pneumonia with acute hypoxic respiratory failure dehydration AKI requiring oxygen supplementation, IV antibiotics and IV fluids- -still unable to have any oral intake still continues to require IV fluids -Overall prognosis is poor  Status is: Inpatient   Disposition: The patient is from: SNF              Anticipated d/c is to: SNF              Anticipated d/c date is: 2 days              Patient currently is not medically stable to d/c. Barriers: Not Clinically Stable-   Code Status :  -  Code Status: DNR   Family Communication:  Discussed with patient's mother who is at bedside DVT Prophylaxis  :   - SCDs   heparin injection 5,000 Units Start: 01/02/22 0600  Lab Results  Component Value Date   PLT 83 (L) 01/05/2022   Inpatient Medications  Scheduled Meds:  aspirin EC  81 mg Oral Q breakfast   atorvastatin  40 mg Oral Daily   Chlorhexidine Gluconate Cloth  6 each Topical Daily    heparin  5,000 Units Subcutaneous Q8H   insulin aspart  0-9 Units Subcutaneous Q4H   mouth rinse  15 mL Mouth Rinse 4 times per day   Continuous Infusions:  0.45 % NaCl with KCl 20 mEq / L 125 mL/hr at 01/05/22 1804   azithromycin Stopped (01/05/22 1237)   cefTRIAXone (ROCEPHIN)  IV Stopped (01/05/22 1429)   dextrose 5 % and 0.45% NaCl     PRN Meds:.acetaminophen **OR** acetaminophen, albuterol, ondansetron **OR** ondansetron (ZOFRAN) IV, mouth rinse   Anti-infectives (From admission, onward)    Start     Dose/Rate Route Frequency Ordered Stop   01/04/22 1400  cefTRIAXone (ROCEPHIN) 2 g in sodium chloride 0.9 % 100 mL IVPB        2 g 200 mL/hr over 30 Minutes Intravenous Every 24 hours 01/04/22 1057 01/09/22 1359   01/03/22 1000  ceFEPIme (MAXIPIME) 2 g in sodium chloride 0.9 % 100 mL IVPB  Status:  Discontinued        2 g 200 mL/hr over 30 Minutes Intravenous Every 8 hours 01/03/22 0922 01/04/22 1057   01/03/22 0200  vancomycin (VANCOREADY) IVPB 750 mg/150 mL  Status:  Discontinued        750 mg 150 mL/hr over 60 Minutes Intravenous Every 24 hours 01/02/22 1136 01/03/22 1029   01/02/22 1130  azithromycin (ZITHROMAX) 500 mg in sodium chloride 0.9 % 250 mL IVPB        500  mg 250 mL/hr over 60 Minutes Intravenous Every 24 hours 01/02/22 1038     01/02/22 1000  ceFEPIme (MAXIPIME) 2 g in sodium chloride 0.9 % 100 mL IVPB  Status:  Discontinued        2 g 200 mL/hr over 30 Minutes Intravenous Every 12 hours 01/02/22 0109 01/03/22 0922   01/02/22 0109  vancomycin variable dose per unstable renal function (pharmacist dosing)  Status:  Discontinued         Does not apply See admin instructions 01/02/22 0109 01/02/22 1136   01/01/22 2330  ceFEPIme (MAXIPIME) 2 g in sodium chloride 0.9 % 100 mL IVPB        2 g 200 mL/hr over 30 Minutes Intravenous  Once 01/01/22 2320 01/02/22 0314   01/01/22 2315  vancomycin (VANCOREADY) IVPB 2000 mg/400 mL        2,000 mg 200 mL/hr over 120 Minutes  Intravenous  Once 01/01/22 2306 01/02/22 0243   01/01/22 2245  metroNIDAZOLE (FLAGYL) IVPB 500 mg  Status:  Discontinued        500 mg 100 mL/hr over 60 Minutes Intravenous Every 12 hours 01/01/22 2235 01/02/22 1038       Subjective: [name] today has no fevers, no emesis,  No chest pain,   - -No significant cough -Sleepy on and off Swallowing difficulties persist-- --still unable to have any oral intake still continues to require IV fluids  Objective: Vitals:   01/05/22 1500 01/05/22 1600 01/05/22 1700 01/05/22 1800  BP: 126/87 132/76 (!) 142/75 (!) 155/61  Pulse: 71 75 71 77  Resp: 10 (!) 9 12 12   Temp:      TempSrc:      SpO2: 96% 99% 98% 97%  Weight:      Height:        Intake/Output Summary (Last 24 hours) at 01/05/2022 1826 Last data filed at 01/05/2022 1824 Gross per 24 hour  Intake 3365.06 ml  Output 1145 ml  Net 2220.06 ml   Filed Weights   01/03/22 0439 01/04/22 0429 01/05/22 0500  Weight: 62.3 kg 65.7 kg 67.1 kg   Physical Exam  Gen:-Sleepy on and off, wakes up to voice, no acute distress HEENT:- [place].AT, No sclera icterus Nose--weaned off Oxygen on 01/03/22 Neck-Supple Neck,No JVD,.  Lungs-improving air movement, no wheezing  CV- S1, S2 normal, regular  Abd-  +ve B.Sounds, Abd Soft, No tenderness,    Extremity/Skin:- No  edema, pedal pulses present  Psych-moderate memory and cognitive deficits since his strokes over the summer Neuro-residual left-sided hemiparesis with mild Lt UE contracture which is not new, no additional new focal deficits, no tremors  Data Reviewed: I have personally reviewed following labs and imaging studies  CBC: Recent Labs  Lab 01/01/22 1802 01/02/22 0259 01/03/22 0448 01/04/22 0221 01/05/22 0400  WBC 19.3* 18.1* 14.9* 13.9* 12.7*  NEUTROABS 16.9*  --   --   --   --   HGB 17.3* 14.7 11.9* 11.2* 10.6*  HCT 52.2* 46.7 36.0* 34.7* 33.4*  MCV 93.2 95.9 92.8 93.0 95.4  PLT 163 126* 89* 88* 83*   Basic Metabolic  Panel: Recent Labs  Lab 01/01/22 1802 01/02/22 0259 01/02/22 1516 01/03/22 0448 01/04/22 0221 01/05/22 0400  NA 147* 152* 144 149* 148* 147*  K 5.0 4.6 3.2* 2.8* 3.3* 3.5  CL 114* 115* 118* 122* 123* 121*  CO2 21* 21* 19* 21* 20* 21*  GLUCOSE 216* 155* 128* 101* 97 98  BUN 69* 68* 62* 50* 39*  25*  CREATININE 1.98* 2.02* 1.53* 1.14 0.90 0.66  CALCIUM 10.1 9.4 8.4* 8.5* 8.4* 8.1*  MG 2.6*  --   --   --  2.0  --   PHOS  --   --  2.7  --  1.6*  --    GFR: Estimated Creatinine Clearance: 90.7 mL/min (by C-G formula based on SCr of 0.66 mg/dL). No results for input(s): "HGBA1C" in the last 72 hours.  Recent Results (from the past 240 hour(s))  Resp Panel by RT-PCR (Flu A&B, Covid) Anterior Nasal Swab     Status: None   Collection Time: 01/01/22  6:09 PM   Specimen: Anterior Nasal Swab  Result Value Ref Range Status   SARS Coronavirus 2 by RT PCR NEGATIVE NEGATIVE Final    Comment: (NOTE) SARS-CoV-2 target nucleic acids are NOT DETECTED.  The SARS-CoV-2 RNA is generally detectable in upper respiratory specimens during the acute phase of infection. The lowest concentration of SARS-CoV-2 viral copies this assay can detect is 138 copies/mL. A negative result does not preclude SARS-Cov-2 infection and should not be used as the sole basis for treatment or other patient management decisions. A negative result may occur with  improper specimen collection/handling, submission of specimen other than nasopharyngeal swab, presence of viral mutation(s) within the areas targeted by this assay, and inadequate number of viral copies(<138 copies/mL). A negative result must be combined with clinical observations, patient history, and epidemiological information. The expected result is Negative.  Fact Sheet for Patients:  BloggerCourse.com  Fact Sheet for Healthcare Providers:  SeriousBroker.it  This test is no t yet approved or cleared by  the [place] FDA and  has been authorized for detection and/or diagnosis of SARS-CoV-2 by FDA under an Emergency Use Authorization (EUA). This EUA will remain  in effect (meaning this test can be used) for the duration of the COVID-19 declaration under Section 564(b)(1) of the Act, 21 U.S.C.section 360bbb-3(b)(1), unless the authorization is terminated  or revoked sooner.       Influenza A by PCR NEGATIVE NEGATIVE Final   Influenza B by PCR NEGATIVE NEGATIVE Final    Comment: (NOTE) The Xpert Xpress SARS-CoV-2/FLU/RSV plus assay is intended as an aid in the diagnosis of influenza from Nasopharyngeal swab specimens and should not be used as a sole basis for treatment. Nasal washings and aspirates are unacceptable for Xpert Xpress SARS-CoV-2/FLU/RSV testing.  Fact Sheet for Patients: BloggerCourse.com  Fact Sheet for Healthcare Providers: SeriousBroker.it  This test is not yet approved or cleared by the [place] FDA and has been authorized for detection and/or diagnosis of SARS-CoV-2 by FDA under an Emergency Use Authorization (EUA). This EUA will remain in effect (meaning this test can be used) for the duration of the COVID-19 declaration under Section 564(b)(1) of the Act, 21 U.S.C. section 360bbb-3(b)(1), unless the authorization is terminated or revoked.  Performed at [facility], [address], [place], [place] [address]   Culture, blood (Routine X 2) w Reflex to ID Panel     Status: None (Preliminary result)   Collection Time: 01/01/22 10:29 PM   Specimen: Right Antecubital; Blood  Result Value Ref Range Status   Specimen Description RIGHT ANTECUBITAL  Final   Special Requests   Final    BOTTLES DRAWN AEROBIC AND ANAEROBIC Blood Culture adequate volume   Culture   Final    NO GROWTH 4 DAYS Performed at [facility], [address], [place], [place] [address]    Report Status PENDING  Incomplete  Culture,  blood (Routine X 2) w Reflex to ID Panel     Status: None (Preliminary result)   Collection Time: 01/01/22 10:29 PM   Specimen: BLOOD RIGHT HAND  Result Value Ref Range Status   Specimen Description BLOOD RIGHT HAND  Final   Special Requests   Final    BOTTLES DRAWN AEROBIC AND ANAEROBIC Blood Culture results may not be optimal due to an inadequate volume of blood received in culture bottles   Culture   Final    NO GROWTH 4 DAYS Performed at [facility], [address] [place], [place] [address]    Report Status PENDING  Incomplete  MRSA Next Gen by PCR, Nasal     Status: None   Collection Time: 01/01/22 11:58 PM   Specimen: Nasal Mucosa; Nasal Swab  Result Value Ref Range Status   MRSA by PCR Next Gen NOT DETECTED NOT DETECTED Final    Comment: (NOTE) The GeneXpert MRSA Assay (FDA approved for NASAL specimens only), is one component of a comprehensive MRSA colonization surveillance program. It is not intended to diagnose MRSA infection nor to guide or monitor treatment for MRSA infections. Test performance is not FDA approved in patients less than 61 years old. Performed at [facility], [address] [place], [place] [address]   Culture, blood (Routine X 2) w Reflex to ID Panel     Status: None (Preliminary result)   Collection Time: 01/03/22 12:49 PM   Specimen: BLOOD  Result Value Ref Range Status   Specimen Description BLOOD BLOOD LEFT ARM  Final   Special Requests   Final    BLOOD BOTTLES DRAWN AEROBIC AND ANAEROBIC Blood Culture adequate volume BLOOD RIGHT HAND   Culture   Final    NO GROWTH 2 DAYS Performed at [facility], [address] [place], [place] [address]    Report Status PENDING  Incomplete  Culture, blood (Routine X 2) w Reflex to ID Panel     Status: None (Preliminary result)   Collection Time: 01/03/22 12:49 PM   Specimen: BLOOD  Result Value Ref Range Status   Specimen Description BLOOD  Final   Special Requests   Final    BLOOD BOTTLES DRAWN  AEROBIC AND ANAEROBIC Blood Culture adequate volume BLOOD RIGHT HAND   Culture   Final    NO GROWTH 2 DAYS Performed at [facility], [address] [place], [place] [address]    Report Status PENDING  Incomplete  Urine Culture     Status: None   Collection Time: 01/03/22  1:45 PM   Specimen: In/Out Cath Urine  Result Value Ref Range Status   Specimen Description   Final    IN/OUT CATH URINE Performed at [facility], [address] [place], [place] [address]    Special Requests   Final    NONE Performed at [facility], [address] [place], [place] [address]    Culture   Final    NO GROWTH Performed at [facility], [address] [place], [place] [address]    Report Status 01/05/2022 FINAL  Final    Radiology Studies: DG CHEST PORT 1 VIEW  Result Date: 01/04/2022 CLINICAL DATA:  [id number] with dyspnea and respiratory abnormalities. Follow-up pneumonia and sepsis. EXAM: PORTABLE CHEST 1 VIEW COMPARISON:  Portable chest and chest CT both 01/01/2022 FINDINGS: 4:56 a.m. There is interval worsening of patchy airspace disease in the left lower lobe. There was faint peribronchovascular nodularity on CT  in the right upper and middle lobes but it is not well redemonstrated radiographically if it is still present. The remaining lungs appear generally clear. There is overlying monitor wiring. Heart size and vasculature are normal and no pleural effusion is seen. There is calcification in the aortic arch. Stable mediastinum.  Osteopenia and thoracic spondylosis. IMPRESSION: 1. Interval worsening of patchy airspace disease in the left lower lobe. 2. Faint peribronchovascular nodularity on CT in the right upper and middle lobes is not well redemonstrated radiographically if it is still present. The rest of the lungs are generally clear. Electronically Signed   By: Almira Bar M.D.   On: 01/04/2022 06:03     Scheduled Meds:  aspirin EC  81 mg Oral Q breakfast   atorvastatin  40 mg Oral  Daily   Chlorhexidine Gluconate Cloth  6 each Topical Daily   heparin  5,000 Units Subcutaneous Q8H   insulin aspart  0-9 Units Subcutaneous Q4H   mouth rinse  15 mL Mouth Rinse 4 times per day   Continuous Infusions:  0.45 % NaCl with KCl 20 mEq / L 125 mL/hr at 01/05/22 1804   azithromycin Stopped (01/05/22 1237)   cefTRIAXone (ROCEPHIN)  IV Stopped (01/05/22 1429)   dextrose 5 % and 0.45% NaCl      LOS: 4 days   Shon Hale M.D on 01/05/2022 at 6:26 PM  Go to [email or web address] - for contact info  [facility] - Office  [phone]  If 7PM-7AM, please contact night-coverage [email or web address] 01/05/2022, 6:26 PM

## 2022-01-05 NOTE — Plan of Care (Signed)

## 2022-01-05 NOTE — Progress Notes (Signed)
Dr. Marisa Severin asked to give the patients his pills with apple sauce, patient is not alert and oriented, does not follow commands and oral medicines not given. Dr. Marisa Severin made aware.

## 2022-01-06 DIAGNOSIS — J9601 Acute respiratory failure with hypoxia: Secondary | ICD-10-CM | POA: Diagnosis not present

## 2022-01-06 LAB — GLUCOSE, CAPILLARY
Glucose-Capillary: 112 mg/dL — ABNORMAL HIGH (ref 70–99)
Glucose-Capillary: 120 mg/dL — ABNORMAL HIGH (ref 70–99)
Glucose-Capillary: 125 mg/dL — ABNORMAL HIGH (ref 70–99)
Glucose-Capillary: 126 mg/dL — ABNORMAL HIGH (ref 70–99)
Glucose-Capillary: 131 mg/dL — ABNORMAL HIGH (ref 70–99)
Glucose-Capillary: 135 mg/dL — ABNORMAL HIGH (ref 70–99)
Glucose-Capillary: 94 mg/dL (ref 70–99)

## 2022-01-06 LAB — BASIC METABOLIC PANEL
Anion gap: 5 (ref 5–15)
BUN: 18 mg/dL (ref 8–23)
CO2: 22 mmol/L (ref 22–32)
Calcium: 7.9 mg/dL — ABNORMAL LOW (ref 8.9–10.3)
Chloride: 119 mmol/L — ABNORMAL HIGH (ref 98–111)
Creatinine, Ser: 0.6 mg/dL — ABNORMAL LOW (ref 0.61–1.24)
GFR, Estimated: >60 mL/min (ref >60)
Glucose, Bld: 132 mg/dL — ABNORMAL HIGH (ref 70–99)
Potassium: 3.2 mmol/L — ABNORMAL LOW (ref 3.5–5.1)
Sodium: 146 mmol/L — ABNORMAL HIGH (ref 135–145)

## 2022-01-06 LAB — CULTURE, BLOOD (ROUTINE X 2)
Culture: NO GROWTH
Culture: NO GROWTH
Special Requests: ADEQUATE

## 2022-01-06 MED ORDER — POTASSIUM CHLORIDE 10 MEQ/100ML IV SOLN
10.0000 meq | INTRAVENOUS | Status: AC
  Administered 2022-01-06 (×4): 10 meq via INTRAVENOUS
  Filled 2022-01-06 (×4): qty 100

## 2022-01-06 MED ORDER — ALBUTEROL SULFATE (2.5 MG/3ML) 0.083% IN NEBU: 2.5000 mg | INHALATION_SOLUTION | Freq: Four times a day (QID) | RESPIRATORY_TRACT | Status: DC | PRN

## 2022-01-06 MED ORDER — DEXTROSE 5 % IV SOLN
INTRAVENOUS | Status: DC
  Administered 2022-01-06: 1000 mL via INTRAVENOUS

## 2022-01-06 NOTE — Progress Notes (Signed)
Speech Language Pathology Treatment: Dysphagia  Patient Details Name: Frank Luna MRN: [id number] DOB: [birth year] Today's Date: 01/06/2022 Time: 1050-1105 SLP Time Calculation (min) (ACUTE ONLY): 15 min  Assessment / Plan / Recommendation Clinical Impression  Pt seen for ongoing dysphagia intervention to check for swallow readiness. Unfortunately, Pt continues to present with lethargy and poor level of alertness necessary for PO intake. RN reports minimally interactive with his mother yesterday for a brief period of time. Pt has left hemiparesis from stroke a few years ago. He was seen for cognitive linguistic eval and treat, but no record of dysphagia/swallowing intervention. Pt elevated in bed and oral care completed. Pt opened eyes briefly. Ice chip administered and Pt with poor manipulation and immediate cough. Pt inappropriate for PO intake at this time due to lethargy. Above to RN. SLP will check back tomorrow. Recommend oral care.    HPI HPI: 61 y.o. male with medical history significant for diabetes mellitus, stroke, hypertension  Admitted on 01/01/2022 with sepsis secondary to sepsis secondary to multifocal pneumonia left more than right with acute hypoxic respiratory failure requiring up to 15 L of oxygen on nonrebreather bag initially as well as acute metabolic encephalopathy and AKI. H&P indicate dysphagia and cognitive/speech impairment since CVAs from July of 2023 however unable to find hx of ST treatment for either in care everywhere; attempted to contact Pt's wife via phone without success. BSE requested      SLP Plan  Continue with current plan of care      Recommendations for follow up therapy are one component of a multi-disciplinary discharge planning process, led by the attending physician.  Recommendations may be updated based on patient status, additional functional criteria and insurance authorization.    Recommendations  Diet recommendations: NPO Medication  Administration: Via alternative means                Oral Care Recommendations: Oral care QID;Staff/trained caregiver to provide oral care Follow Up Recommendations: Follow physician's recommendations for discharge plan and follow up therapies Assistance recommended at discharge: Frequent or constant Supervision/Assistance SLP Visit Diagnosis: Dysphagia, unspecified (R13.10) Plan: Continue with current plan of care         Thank you,  Havery Moros, CCC-SLP [phone]   Breasia Karges  01/06/2022, 11:11 AM

## 2022-01-06 NOTE — Progress Notes (Signed)
Patient received asleep, aroused by voice but somnolent, rectal tube and condom catheter in place, bed bath rendered. Patient seen by speech therapist, patient unable to swallow without coughing, oral medications held. Patient positioned for comfort with family member visiting at bedside.

## 2022-01-06 NOTE — Progress Notes (Signed)
Patient presented asleep, awaken for incontinence care, had medium BM, purewick replaced, repositioned for breakfast, assisted with meal, ate 25% of breakfast, mouth care rendered, positioned for comfort with call bell in reach.

## 2022-01-06 NOTE — Progress Notes (Signed)
PROGRESS NOTE     Frank Luna, is a 61 y.o. male, DOB - Dec 25, 1960, [id number]  Admit date - 01/01/2022   Admitting Physician Ejiroghene Arlyce Dice, MD  Outpatient Primary MD for the patient is [name] [facility]  LOS - 5  Chief Complaint  Patient presents with   Shortness of Breath        Brief Narrative:  61 y.o. male with medical history significant for diabetes mellitus, stroke, hypertension  Admitted on 01/01/2022 with sepsis secondary to sepsis secondary to multifocal pneumonia left more than right with acute hypoxic respiratory failure requiring up to 15 L of oxygen on nonrebreather bag initially as well as acute metabolic encephalopathy and AKI    -Assessment and Plan: 1)Acute Respiratory Failure with Hypoxia  secondary to multifocal pneumonia left more than right with acute hypoxic respiratory failure requiring up to 15 L of oxygen on nonrebreather bag initially--- -CT chest without contrast with multifocal pneumonia mostly on the left side -Added Azithromycin on 01/02/22 -Treated with cefepime ,  -de-escalated to Rocephin on 01/04/2022 -Stopped Vancomycin on 01/03/22 --Continue bronchodilators and mucolytics -Hypoxia has resolved --Oxygen Weaned off on 01/03/22  2)Sepsis secondary to multifocal pneumonia left more than right with acute hypoxic respiratory failure  WBC 19.3 >>18.1>>14.9>>13.9>>12.7 PCT 0.24 >>0.32>>0.42 -Lactic Acid 5.4 >> 6.1  -Management as above #1 -Urine and blood cultures NGTD  3) dysphagia--- speech eval appreciated patient unable to have any oral intake -N.p.o. status recommended -Family not keen on PEG tube -If patient does not improve we will have to consider comfort care and transition to hospice =-Palliative care consult to further delineate goals of care requested  4)Dehydration/Hypernatremia------due to poor oral intake and sepsis  --Patient is n.p.o. due to swallowing difficulties -IVF adjusted Na --trending down  slowly -Monitor serial BMP, adjust IV fluid consistency and rate as indicated  5)AKI (acute kidney injury) ([facility]) On admission Cr elevated at 1.98, BUN of 69.  -As per Care Everywhere creatinine was 1.0 on 10/24/2021 -Creatinine and renal function normalized with hydration -IV fluids adjusted as above -renally adjust medications, avoid nephrotoxic agents / dehydration  / hypotension -Hold lisinopril  6)Recurrent Strokes----MRI Brain from 09/27/21 showed Acute perforator infarct involving the left basal ganglia,  Evolving subacute infarct in the right corona radiata, Remote lacunar infarcts, chronic microvascular ischemic disease and cerebral atrophy -Patient had speech, swallowing, cognitive deficits as well as left-sided hemiparesis--- since July 2023 got worse in August 2023 despite rehab--he has been unable to make significant improvement -Per patient's mother patient has had moderate memory and cognitive deficits since his strokes over the summer -CT head at this time and neuro exam at this time failed to demonstrate significant concerns for new additional strokes  7)Acute metabolic encephalopathy--secondary to sepsis/AKI/hypoxia compounded with underlying strokes -Seroquel discontinued -Treat above etiologies  8)Elevated troponin---Sepsis and hypoxia related demand ischemia  Mild troponin elevation -troponin 34 > 35.  EKG with new diffuse T wave inversions in lead to 3 aVF V3 through V6.    9)DM2-  A1C is 6.5 reflecting good diabetic control PTA -Hold home Lantus, glipizide, Farxiga Use Novolog/Humalog Sliding scale insulin with Accu-Cheks/Fingersticks as ordered   10)HTN Okay to restart metoprolol and amlodipine when able to take oral meds -continue to hold lisinopril due to dehydration and AKI - may use IV Hydralazine 10 mg  Every 4 hours Prn for systolic blood pressure over 160 mmhg  11)Social/Ethics--- plan of care discussed with patient's mother who is at bedside -Patient is  legally  married but he has been separated from his wife since April 2023 -He lives with his mother since April 2023, suffered recurrent strokes in July and August 2023 and ended up being admitted to SNF facility for rehab since August 2023 -Patient wife [name] defer decision making to patient's mom [name] -Patient has a stepdaughter [name] -Patient remains a DNR/DNI -Given lack of neurological and neuromuscular improvement from strokes since July/August 2023, overall prognosis for full recovery from 2 weeks recurrent stroke related deficits Not very encouraging -Patient's mother [name]... Indicates that she probably would not want a feeding tube -If patient remains unable to safely take oral intake may have to consider transition to palliative/comfort care  12)Hypokalemia/hypophosphatemia --- replace and recheck, magnesium WNL  Disposition/Need for in-Hospital Stay- patient unable to be discharged at this time due to -sepsis secondary to multifocal pneumonia with acute hypoxic respiratory failure dehydration AKI requiring oxygen supplementation, IV antibiotics and IV fluids- -still unable to have any oral intake still continues to require IV fluids -Overall prognosis is poor  Status is: Inpatient   Disposition: The patient is from: SNF              Anticipated d/c is to: SNF              Anticipated d/c date is: 2 days              Patient currently is not medically stable to d/c. Barriers: Not Clinically Stable-   Code Status :  -  Code Status: DNR   Family Communication:  Discussed with patient's mother who is at bedside DVT Prophylaxis  :   - SCDs   heparin injection 5,000 Units Start: 01/02/22 0600  Lab Results  Component Value Date   PLT 83 (L) 01/05/2022   Inpatient Medications  Scheduled Meds:  aspirin EC  81 mg Oral Q breakfast   atorvastatin  40 mg Oral Daily   Chlorhexidine Gluconate Cloth  6 each Topical Daily   heparin  5,000 Units Subcutaneous Q8H    insulin aspart  0-9 Units Subcutaneous Q4H   mouth rinse  15 mL Mouth Rinse 4 times per day   Continuous Infusions:  0.45 % NaCl with KCl 20 mEq / L Stopped (01/06/22 1451)   azithromycin 500 mg (01/06/22 1048)   cefTRIAXone (ROCEPHIN)  IV 2 g (01/06/22 1454)   dextrose 1,000 mL (01/06/22 1119)   PRN Meds:.acetaminophen **OR** acetaminophen, albuterol, ondansetron **OR** ondansetron (ZOFRAN) IV, mouth rinse   Anti-infectives (From admission, onward)    Start     Dose/Rate Route Frequency Ordered Stop   01/04/22 1400  cefTRIAXone (ROCEPHIN) 2 g in sodium chloride 0.9 % 100 mL IVPB        2 g 200 mL/hr over 30 Minutes Intravenous Every 24 hours 01/04/22 1057 01/09/22 1359   01/03/22 1000  ceFEPIme (MAXIPIME) 2 g in sodium chloride 0.9 % 100 mL IVPB  Status:  Discontinued        2 g 200 mL/hr over 30 Minutes Intravenous Every 8 hours 01/03/22 0922 01/04/22 1057   01/03/22 0200  vancomycin (VANCOREADY) IVPB 750 mg/150 mL  Status:  Discontinued        750 mg 150 mL/hr over 60 Minutes Intravenous Every 24 hours 01/02/22 1136 01/03/22 1029   01/02/22 1130  azithromycin (ZITHROMAX) 500 mg in sodium chloride 0.9 % 250 mL IVPB        500 mg 250 mL/hr over 60 Minutes Intravenous Every 24 hours  01/02/22 1038     01/02/22 1000  ceFEPIme (MAXIPIME) 2 g in sodium chloride 0.9 % 100 mL IVPB  Status:  Discontinued        2 g 200 mL/hr over 30 Minutes Intravenous Every 12 hours 01/02/22 0109 01/03/22 0922   01/02/22 0109  vancomycin variable dose per unstable renal function (pharmacist dosing)  Status:  Discontinued         Does not apply See admin instructions 01/02/22 0109 01/02/22 1136   01/01/22 2330  ceFEPIme (MAXIPIME) 2 g in sodium chloride 0.9 % 100 mL IVPB        2 g 200 mL/hr over 30 Minutes Intravenous  Once 01/01/22 2320 01/02/22 0314   01/01/22 2315  vancomycin (VANCOREADY) IVPB 2000 mg/400 mL        2,000 mg 200 mL/hr over 120 Minutes Intravenous  Once 01/01/22 2306 01/02/22 0243    01/01/22 2245  metroNIDAZOLE (FLAGYL) IVPB 500 mg  Status:  Discontinued        500 mg 100 mL/hr over 60 Minutes Intravenous Every 12 hours 01/01/22 2235 01/02/22 1038       Subjective: Carson Myrtle today has no fevers, no emesis,  No chest pain,   - -No significant cough -Sleepy on and off Swallowing difficulties persist-- --still unable to have any oral intake still continues to require IV fluids  Objective: Vitals:   01/06/22 0608 01/06/22 0754 01/06/22 1147 01/06/22 1635  BP: (!) 143/72     Pulse:  76 77   Resp: 10 13 13 13   Temp:  99.1 F (37.3 C) 97.7 F (36.5 C) 97.9 F (36.6 C)  TempSrc:  Axillary Axillary Axillary  SpO2:  98% 100%   Weight:      Height:        Intake/Output Summary (Last 24 hours) at 01/06/2022 1727 Last data filed at 01/06/2022 1549 Gross per 24 hour  Intake 1985.81 ml  Output 2035 ml  Net -49.19 ml   Filed Weights   01/04/22 0429 01/05/22 0500 01/06/22 0400  Weight: 65.7 kg 67.1 kg 67.9 kg   Physical Exam  Gen:-Sleepy on and off, wakes up to voice, no acute distress HEENT:- [place].AT, No sclera icterus Nose--weaned off Oxygen on 01/03/22 Neck-Supple Neck,No JVD,.  Lungs-improving air movement, no wheezing  CV- S1, S2 normal, regular  Abd-  +ve B.Sounds, Abd Soft, No tenderness,    Extremity/Skin:- No  edema, pedal pulses present  Psych-moderate memory and cognitive deficits since his strokes over the summer Neuro-residual left-sided hemiparesis with mild Lt UE contracture which is not new, no additional new focal deficits, no tremors  Data Reviewed: I have personally reviewed following labs and imaging studies  CBC: Recent Labs  Lab 01/01/22 1802 01/02/22 0259 01/03/22 0448 01/04/22 0221 01/05/22 0400  WBC 19.3* 18.1* 14.9* 13.9* 12.7*  NEUTROABS 16.9*  --   --   --   --   HGB 17.3* 14.7 11.9* 11.2* 10.6*  HCT 52.2* 46.7 36.0* 34.7* 33.4*  MCV 93.2 95.9 92.8 93.0 95.4  PLT 163 126* 89* 88* 83*   Basic Metabolic  Panel: Recent Labs  Lab 01/01/22 1802 01/02/22 0259 01/02/22 1516 01/03/22 0448 01/04/22 0221 01/05/22 0400 01/06/22 0839  NA 147*   < > 144 149* 148* 147* 146*  K 5.0   < > 3.2* 2.8* 3.3* 3.5 3.2*  CL 114*   < > 118* 122* 123* 121* 119*  CO2 21*   < > 19* 21* 20* 21* 22  GLUCOSE 216*   < > 128* 101* 97 98 132*  BUN 69*   < > 62* 50* 39* 25* 18  CREATININE 1.98*   < > 1.53* 1.14 0.90 0.66 0.60*  CALCIUM 10.1   < > 8.4* 8.5* 8.4* 8.1* 7.9*  MG 2.6*  --   --   --  2.0  --   --   PHOS  --   --  2.7  --  1.6*  --   --    < > = values in this interval not displayed.   GFR: Estimated Creatinine Clearance: 90.7 mL/min (A) (by C-G formula based on SCr of 0.6 mg/dL (L)). No results for input(s): "HGBA1C" in the last 72 hours.  Recent Results (from the past 240 hour(s))  Resp Panel by RT-PCR (Flu A&B, Covid) Anterior Nasal Swab     Status: None   Collection Time: 01/01/22  6:09 PM   Specimen: Anterior Nasal Swab  Result Value Ref Range Status   SARS Coronavirus 2 by RT PCR NEGATIVE NEGATIVE Final    Comment: (NOTE) SARS-CoV-2 target nucleic acids are NOT DETECTED.  The SARS-CoV-2 RNA is generally detectable in upper respiratory specimens during the acute phase of infection. The lowest concentration of SARS-CoV-2 viral copies this assay can detect is 138 copies/mL. A negative result does not preclude SARS-Cov-2 infection and should not be used as the sole basis for treatment or other patient management decisions. A negative result may occur with  improper specimen collection/handling, submission of specimen other than nasopharyngeal swab, presence of viral mutation(s) within the areas targeted by this assay, and inadequate number of viral copies(<138 copies/mL). A negative result must be combined with clinical observations, patient history, and epidemiological information. The expected result is Negative.  Fact Sheet for Patients:   EntrepreneurPulse.com.au  Fact Sheet for Healthcare Providers:  IncredibleEmployment.be  This test is no t yet approved or cleared by the [place] FDA and  has been authorized for detection and/or diagnosis of SARS-CoV-2 by FDA under an Emergency Use Authorization (EUA). This EUA will remain  in effect (meaning this test can be used) for the duration of the COVID-19 declaration under Section 564(b)(1) of the Act, 21 U.S.C.section 360bbb-3(b)(1), unless the authorization is terminated  or revoked sooner.       Influenza A by PCR NEGATIVE NEGATIVE Final   Influenza B by PCR NEGATIVE NEGATIVE Final    Comment: (NOTE) The Xpert Xpress SARS-CoV-2/FLU/RSV plus assay is intended as an aid in the diagnosis of influenza from Nasopharyngeal swab specimens and should not be used as a sole basis for treatment. Nasal washings and aspirates are unacceptable for Xpert Xpress SARS-CoV-2/FLU/RSV testing.  Fact Sheet for Patients: EntrepreneurPulse.com.au  Fact Sheet for Healthcare Providers: IncredibleEmployment.be  This test is not yet approved or cleared by the [place] FDA and has been authorized for detection and/or diagnosis of SARS-CoV-2 by FDA under an Emergency Use Authorization (EUA). This EUA will remain in effect (meaning this test can be used) for the duration of the COVID-19 declaration under Section 564(b)(1) of the Act, 21 U.S.C. section 360bbb-3(b)(1), unless the authorization is terminated or revoked.  Performed at [facility], [address] [place],  [address]   Culture, blood (Routine X 2) w Reflex to ID Panel     Status: None   Collection Time: 01/01/22 10:29 PM   Specimen: Right Antecubital; Blood  Result Value Ref Range Status   Specimen Description RIGHT ANTECUBITAL  Final   Special  Requests   Final    BOTTLES DRAWN AEROBIC AND ANAEROBIC Blood Culture adequate volume    Culture   Final    NO GROWTH 5 DAYS Performed at [facility], [address], [place], [place] [address]    Report Status 01/06/2022 FINAL  Final  Culture, blood (Routine X 2) w Reflex to ID Panel     Status: None   Collection Time: 01/01/22 10:29 PM   Specimen: BLOOD RIGHT HAND  Result Value Ref Range Status   Specimen Description BLOOD RIGHT HAND  Final   Special Requests   Final    BOTTLES DRAWN AEROBIC AND ANAEROBIC Blood Culture results may not be optimal due to an inadequate volume of blood received in culture bottles   Culture   Final    NO GROWTH 5 DAYS Performed at [facility], [address] [place], [place] [address]    Report Status 01/06/2022 FINAL  Final  MRSA Next Gen by PCR, Nasal     Status: None   Collection Time: 01/01/22 11:58 PM   Specimen: Nasal Mucosa; Nasal Swab  Result Value Ref Range Status   MRSA by PCR Next Gen NOT DETECTED NOT DETECTED Final    Comment: (NOTE) The GeneXpert MRSA Assay (FDA approved for NASAL specimens only), is one component of a comprehensive MRSA colonization surveillance program. It is not intended to diagnose MRSA infection nor to guide or monitor treatment for MRSA infections. Test performance is not FDA approved in patients less than 17 years old. Performed at [facility] [place], [address] [place], [place] [address]   Culture, blood (Routine X 2) w Reflex to ID Panel     Status: None (Preliminary result)   Collection Time: 01/03/22 12:49 PM   Specimen: BLOOD  Result Value Ref Range Status   Specimen Description BLOOD BLOOD LEFT ARM  Final   Special Requests   Final    BLOOD BOTTLES DRAWN AEROBIC AND ANAEROBIC Blood Culture adequate volume BLOOD RIGHT HAND   Culture   Final    NO GROWTH 3 DAYS Performed at [facility], [address] [place], [place] [address]    Report Status PENDING  Incomplete  Culture, blood (Routine X 2) w Reflex to ID Panel     Status: None (Preliminary result)   Collection Time: 01/03/22 12:49 PM    Specimen: BLOOD  Result Value Ref Range Status   Specimen Description BLOOD  Final   Special Requests   Final    BLOOD BOTTLES DRAWN AEROBIC AND ANAEROBIC Blood Culture adequate volume BLOOD RIGHT HAND   Culture   Final    NO GROWTH 3 DAYS Performed at [facility], [address], [place], [place] [address]    Report Status PENDING  Incomplete  Urine Culture     Status: None   Collection Time: 01/03/22  1:45 PM   Specimen: In/Out Cath Urine  Result Value Ref Range Status   Specimen Description   Final    IN/OUT CATH URINE Performed at [facility], [address], [place], [place] [address]    Special Requests   Final    NONE Performed at [facility], [address], [place], [place] [address]    Culture   Final    NO GROWTH Performed at [facility], [place] [address], [place],  [address]    Report Status 01/05/2022 FINAL  Final    Radiology Studies: No results found.   Scheduled Meds:  aspirin EC  81  mg Oral Q breakfast   atorvastatin  40 mg Oral Daily   Chlorhexidine Gluconate Cloth  6 each Topical Daily   heparin  5,000 Units Subcutaneous Q8H   insulin aspart  0-9 Units Subcutaneous Q4H   mouth rinse  15 mL Mouth Rinse 4 times per day   Continuous Infusions:  0.45 % NaCl with KCl 20 mEq / L Stopped (01/06/22 1451)   azithromycin 500 mg (01/06/22 1048)   cefTRIAXone (ROCEPHIN)  IV 2 g (01/06/22 1454)   dextrose 1,000 mL (01/06/22 1119)    LOS: 5 days   Roxan Hockey M.D on 01/06/2022 at 5:27 PM  Go to [email or web address] - for contact info  [facility] - Office  [phone]  If 7PM-7AM, please contact night-coverage [email or web address] 01/06/2022, 5:27 PM

## 2022-01-06 NOTE — Plan of Care (Signed)

## 2022-01-07 ENCOUNTER — Encounter (HOSPITAL_COMMUNITY): Payer: Self-pay | Admitting: Internal Medicine

## 2022-01-07 DIAGNOSIS — Z7189 Other specified counseling: Secondary | ICD-10-CM

## 2022-01-07 DIAGNOSIS — Z515 Encounter for palliative care: Secondary | ICD-10-CM

## 2022-01-07 LAB — RENAL FUNCTION PANEL
Albumin: 1.9 g/dL — ABNORMAL LOW (ref 3.5–5.0)
Anion gap: 4 — ABNORMAL LOW (ref 5–15)
BUN: 13 mg/dL (ref 8–23)
CO2: 23 mmol/L (ref 22–32)
Calcium: 7.7 mg/dL — ABNORMAL LOW (ref 8.9–10.3)
Chloride: 117 mmol/L — ABNORMAL HIGH (ref 98–111)
Creatinine, Ser: 0.5 mg/dL — ABNORMAL LOW (ref 0.61–1.24)
GFR, Estimated: >60 mL/min (ref >60)
Glucose, Bld: 124 mg/dL — ABNORMAL HIGH (ref 70–99)
Phosphorus: 1.6 mg/dL — ABNORMAL LOW (ref 2.5–4.6)
Potassium: 3.1 mmol/L — ABNORMAL LOW (ref 3.5–5.1)
Sodium: 144 mmol/L (ref 135–145)

## 2022-01-07 LAB — GLUCOSE, CAPILLARY
Glucose-Capillary: 106 mg/dL — ABNORMAL HIGH (ref 70–99)
Glucose-Capillary: 121 mg/dL — ABNORMAL HIGH (ref 70–99)
Glucose-Capillary: 119 mg/dL — ABNORMAL HIGH (ref 70–99)
Glucose-Capillary: 102 mg/dL — ABNORMAL HIGH (ref 70–99)

## 2022-01-07 LAB — CBC
HCT: 33.1 % — ABNORMAL LOW (ref 39.0–52.0)
Hemoglobin: 10.8 g/dL — ABNORMAL LOW (ref 13.0–17.0)
MCH: 30.5 pg (ref 26.0–34.0)
MCHC: 32.6 g/dL (ref 30.0–36.0)
MCV: 93.5 fL (ref 80.0–100.0)
Platelets: 82 10*3/uL — ABNORMAL LOW (ref 150–400)
RBC: 3.54 MIL/uL — ABNORMAL LOW (ref 4.22–5.81)
RDW: 14.6 % (ref 11.5–15.5)
WBC: 7.3 10*3/uL (ref 4.0–10.5)
nRBC: 0 % (ref 0.0–0.2)

## 2022-01-07 MED ORDER — GLYCOPYRROLATE 1 MG PO TABS: 1.0000 mg | ORAL_TABLET | ORAL | Status: DC | PRN

## 2022-01-07 MED ORDER — POLYVINYL ALCOHOL 1.4 % OP SOLN: 1.0000 [drp] | Freq: Four times a day (QID) | OPHTHALMIC | Status: DC | PRN

## 2022-01-07 MED ORDER — GLYCOPYRROLATE 0.2 MG/ML IJ SOLN: 0.2000 mg | INTRAMUSCULAR | Status: DC | PRN

## 2022-01-07 MED ORDER — BIOTENE DRY MOUTH MT LIQD: 15.0000 mL | OROMUCOSAL | Status: DC | PRN

## 2022-01-07 MED ORDER — LORAZEPAM 2 MG/ML IJ SOLN
1.0000 mg | INTRAMUSCULAR | Status: DC | PRN
  Administered 2022-01-08: 1 mg via INTRAVENOUS
  Filled 2022-01-07: qty 1

## 2022-01-07 MED ORDER — POTASSIUM CHLORIDE 10 MEQ/100ML IV SOLN
10.0000 meq | INTRAVENOUS | Status: DC
  Administered 2022-01-07 (×4): 10 meq via INTRAVENOUS
  Filled 2022-01-07 (×3): qty 100

## 2022-01-07 MED ORDER — HALOPERIDOL LACTATE 5 MG/ML IJ SOLN: 0.5000 mg | INTRAMUSCULAR | Status: DC | PRN

## 2022-01-07 MED ORDER — ONDANSETRON HCL 4 MG/2ML IJ SOLN: 4.0000 mg | Freq: Four times a day (QID) | INTRAMUSCULAR | Status: DC | PRN

## 2022-01-07 MED ORDER — ONDANSETRON 4 MG PO TBDP: 4.0000 mg | ORAL_TABLET | Freq: Four times a day (QID) | ORAL | Status: DC | PRN

## 2022-01-07 MED ORDER — HALOPERIDOL LACTATE 2 MG/ML PO CONC: 0.5000 mg | ORAL | Status: DC | PRN

## 2022-01-07 MED ORDER — POTASSIUM PHOSPHATES 15 MMOLE/5ML IV SOLN
30.0000 mmol | Freq: Once | INTRAVENOUS | Status: DC
  Filled 2022-01-07: qty 10

## 2022-01-07 MED ORDER — ACETAMINOPHEN 325 MG PO TABS: 650.0000 mg | ORAL_TABLET | Freq: Four times a day (QID) | ORAL | Status: DC | PRN

## 2022-01-07 MED ORDER — LORAZEPAM 1 MG PO TABS: 1.0000 mg | ORAL_TABLET | ORAL | Status: DC | PRN

## 2022-01-07 MED ORDER — ACETAMINOPHEN 650 MG RE SUPP: 650.0000 mg | Freq: Four times a day (QID) | RECTAL | Status: DC | PRN

## 2022-01-07 MED ORDER — POTASSIUM CHLORIDE 10 MEQ/100ML IV SOLN
INTRAVENOUS | Status: AC
  Filled 2022-01-07: qty 100

## 2022-01-07 MED ORDER — MORPHINE SULFATE (CONCENTRATE) 10 MG/0.5ML PO SOLN
2.6000 mg | ORAL | Status: DC | PRN
  Administered 2022-01-08 (×2): 5 mg via ORAL
  Filled 2022-01-07 (×2): qty 0.5

## 2022-01-07 MED ORDER — HALOPERIDOL 0.5 MG PO TABS: 0.5000 mg | ORAL_TABLET | ORAL | Status: DC | PRN

## 2022-01-07 NOTE — Plan of Care (Signed)
Problem: Education: Goal: Knowledge of General Education information will improve Description: Including pain rating scale, medication(s)/side effects and non-pharmacologic comfort measures 01/07/2022 2018 by Corrie Mckusick, RN Outcome: Progressing 01/07/2022 2018 by Corrie Mckusick, RN Outcome: Progressing   Problem: Health Behavior/Discharge Planning: Goal: Ability to manage health-related needs will improve 01/07/2022 2018 by Corrie Mckusick, RN Outcome: Progressing 01/07/2022 2018 by Corrie Mckusick, RN Outcome: Progressing   Problem: Clinical Measurements: Goal: Ability to maintain clinical measurements within normal limits will improve 01/07/2022 2018 by Corrie Mckusick, RN Outcome: Progressing 01/07/2022 2018 by Corrie Mckusick, RN Outcome: Progressing Goal: Will remain free from infection 01/07/2022 2018 by Corrie Mckusick, RN Outcome: Progressing 01/07/2022 2018 by Corrie Mckusick, RN Outcome: Progressing Goal: Diagnostic test results will improve 01/07/2022 2018 by Corrie Mckusick, RN Outcome: Progressing 01/07/2022 2018 by Corrie Mckusick, RN Outcome: Progressing Goal: Respiratory complications will improve 01/07/2022 2018 by Corrie Mckusick, RN Outcome: Progressing 01/07/2022 2018 by Corrie Mckusick, RN Outcome: Progressing Goal: Cardiovascular complication will be avoided 01/07/2022 2018 by Corrie Mckusick, RN Outcome: Progressing 01/07/2022 2018 by Corrie Mckusick, RN Outcome: Progressing   Problem: Activity: Goal: Risk for activity intolerance will decrease 01/07/2022 2018 by Corrie Mckusick, RN Outcome: Progressing 01/07/2022 2018 by Corrie Mckusick, RN Outcome: Progressing   Problem: Nutrition: Goal: Adequate nutrition will be maintained 01/07/2022 2018 by Corrie Mckusick, RN Outcome: Progressing 01/07/2022 2018 by Corrie Mckusick, RN Outcome: Progressing   Problem: Coping: Goal: Level of anxiety will  decrease 01/07/2022 2018 by Corrie Mckusick, RN Outcome: Progressing 01/07/2022 2018 by Corrie Mckusick, RN Outcome: Progressing   Problem: Elimination: Goal: Will not experience complications related to bowel motility 01/07/2022 2018 by Corrie Mckusick, RN Outcome: Progressing 01/07/2022 2018 by Corrie Mckusick, RN Outcome: Progressing Goal: Will not experience complications related to urinary retention 01/07/2022 2018 by Corrie Mckusick, RN Outcome: Progressing 01/07/2022 2018 by Corrie Mckusick, RN Outcome: Progressing   Problem: Pain Managment: Goal: General experience of comfort will improve 01/07/2022 2018 by Corrie Mckusick, RN Outcome: Progressing 01/07/2022 2018 by Corrie Mckusick, RN Outcome: Progressing   Problem: Safety: Goal: Ability to remain free from injury will improve 01/07/2022 2018 by Corrie Mckusick, RN Outcome: Progressing 01/07/2022 2018 by Corrie Mckusick, RN Outcome: Progressing   Problem: Skin Integrity: Goal: Risk for impaired skin integrity will decrease 01/07/2022 2018 by Corrie Mckusick, RN Outcome: Progressing 01/07/2022 2018 by Corrie Mckusick, RN Outcome: Progressing   Problem: Education: Goal: Ability to describe self-care measures that may prevent or decrease complications (Diabetes Survival Skills Education) will improve 01/07/2022 2018 by Corrie Mckusick, RN Outcome: Progressing 01/07/2022 2018 by Corrie Mckusick, RN Outcome: Progressing Goal: Individualized Educational Video(s) 01/07/2022 2018 by Corrie Mckusick, RN Outcome: Progressing 01/07/2022 2018 by Corrie Mckusick, RN Outcome: Progressing   Problem: Coping: Goal: Ability to adjust to condition or change in health will improve 01/07/2022 2018 by Corrie Mckusick, RN Outcome: Progressing 01/07/2022 2018 by Corrie Mckusick, RN Outcome: Progressing   Problem: Fluid Volume: Goal: Ability to maintain a balanced intake and output will  improve 01/07/2022 2018 by Corrie Mckusick, RN Outcome: Progressing 01/07/2022 2018 by Corrie Mckusick, RN Outcome: Progressing   Problem: Health Behavior/Discharge Planning: Goal: Ability to identify and utilize available resources and services will improve 01/07/2022 2018 by Corrie Mckusick, RN Outcome: Progressing 01/07/2022 2018 by Corrie Mckusick,  RN Outcome: Progressing Goal: Ability to manage health-related needs will improve 01/07/2022 2018 by Corrie Mckusick, RN Outcome: Progressing 01/07/2022 2018 by Corrie Mckusick, RN Outcome: Progressing   Problem: Metabolic: Goal: Ability to maintain appropriate glucose levels will improve 01/07/2022 2018 by Corrie Mckusick, RN Outcome: Progressing 01/07/2022 2018 by Corrie Mckusick, RN Outcome: Progressing   Problem: Nutritional: Goal: Maintenance of adequate nutrition will improve 01/07/2022 2018 by Corrie Mckusick, RN Outcome: Progressing 01/07/2022 2018 by Corrie Mckusick, RN Outcome: Progressing Goal: Progress toward achieving an optimal weight will improve 01/07/2022 2018 by Corrie Mckusick, RN Outcome: Progressing 01/07/2022 2018 by Corrie Mckusick, RN Outcome: Progressing   Problem: Skin Integrity: Goal: Risk for impaired skin integrity will decrease 01/07/2022 2018 by Corrie Mckusick, RN Outcome: Progressing 01/07/2022 2018 by Corrie Mckusick, RN Outcome: Progressing   Problem: Tissue Perfusion: Goal: Adequacy of tissue perfusion will improve 01/07/2022 2018 by Corrie Mckusick, RN Outcome: Progressing 01/07/2022 2018 by Corrie Mckusick, RN Outcome: Progressing   Problem: Education: Goal: Knowledge of the prescribed therapeutic regimen will improve 01/07/2022 2018 by Corrie Mckusick, RN Outcome: Progressing 01/07/2022 2018 by Corrie Mckusick, RN Outcome: Progressing   Problem: Coping: Goal: Ability to identify and develop effective coping behavior will  improve 01/07/2022 2018 by Corrie Mckusick, RN Outcome: Progressing 01/07/2022 2018 by Corrie Mckusick, RN Outcome: Progressing   Problem: Clinical Measurements: Goal: Quality of life will improve 01/07/2022 2018 by Corrie Mckusick, RN Outcome: Progressing 01/07/2022 2018 by Corrie Mckusick, RN Outcome: Progressing   Problem: Respiratory: Goal: Verbalizations of increased ease of respirations will increase 01/07/2022 2018 by Corrie Mckusick, RN Outcome: Progressing 01/07/2022 2018 by Corrie Mckusick, RN Outcome: Progressing   Problem: Role Relationship: Goal: Family's ability to cope with current situation will improve 01/07/2022 2018 by Corrie Mckusick, RN Outcome: Progressing 01/07/2022 2018 by Corrie Mckusick, RN Outcome: Progressing Goal: Ability to verbalize concerns, feelings, and thoughts to partner or family member will improve 01/07/2022 2018 by Corrie Mckusick, RN Outcome: Progressing 01/07/2022 2018 by Corrie Mckusick, RN Outcome: Progressing   Problem: Pain Management: Goal: Satisfaction with pain management regimen will improve 01/07/2022 2018 by Corrie Mckusick, RN Outcome: Progressing 01/07/2022 2018 by Corrie Mckusick, RN Outcome: Progressing

## 2022-01-07 NOTE — Progress Notes (Signed)
PROGRESS NOTE     Frank Luna, is a 61 y.o. male, DOB - 01-25-1961, WUJ:[id number]  Admit date - 01/01/2022   Admitting Physician [name], MD  Outpatient Primary [facility] for the patient [facility], [facility]  LOS - 6  Chief Complaint  Patient presents with   Shortness of Breath        Brief Narrative:  61 y.o. male with medical history significant for diabetes mellitus, stroke, hypertension  Admitted on 01/01/2022 with sepsis secondary to sepsis secondary to multifocal pneumonia left more than right with acute hypoxic respiratory failure requiring up to 15 L of oxygen on nonrebreather bag initially as well as acute metabolic encephalopathy and AKI    -Assessment and Plan: 1)Acute Respiratory Failure with Hypoxia  secondary to multifocal pneumonia left more than right with acute hypoxic respiratory failure requiring up to 15 L of oxygen on nonrebreather bag initially--- -CT chest without contrast with multifocal pneumonia mostly on the left side -Added Azithromycin on 01/02/22 -Treated with cefepime ,  -de-escalated to Rocephin on 01/04/2022 -Stopped Vancomycin on 01/03/22 -Treated with bronchodilators and mucolytics -Hypoxia has resolved --Oxygen Weaned off on 01/03/22 --Patient's mother request transition to comfort care as of 01/07/2022  2)Sepsis secondary to multifocal pneumonia left more than right with acute hypoxic respiratory failure  WBC 19.3 >>18.1>>14.9>>13.9>>12.7 PCT 0.24 >>0.32>>0.42 -Lactic Acid 5.4 >> 6.1  -Management as above #1 -Urine and blood cultures NGTD --Patient's mother request transition to comfort care as of 01/07/2022  3) dysphagia--- speech eval appreciated patient unable to have any oral intake -N.p.o. status recommended -Family not keen on PEG tube --Patient's mother request transition to comfort care as of 01/07/2022  4)Dehydration/Hypernatremia------due to poor oral intake   --Patient is n.p.o. due to swallowing  difficulties --Patient's mother request transition to comfort care as of 01/07/2022  5)AKI (acute kidney injury) (HCC) On admission Cr elevated at 1.98, BUN of 69.  -As per Care Everywhere creatinine was 1.0 on 10/24/2021 -Creatinine and renal function improved with hydration --Patient's mother request transition to comfort care as of 01/07/2022  6)Recurrent Strokes----MRI Brain from 09/27/21 showed Acute perforator infarct involving the left basal ganglia,  Evolving subacute infarct in the right corona radiata, Remote lacunar infarcts, chronic microvascular ischemic disease and cerebral atrophy -Patient had speech, swallowing, cognitive deficits as well as left-sided hemiparesis--- since July 2023 got worse in August 2023 despite rehab--he has been unable to make significant improvement -Per patient's mother patient has had moderate memory and cognitive deficits since his strokes over the summer -CT head at this time and neuro exam at this time failed to demonstrate significant concerns for new additional strokes --Patient's mother request transition to comfort care as of 01/07/2022  7)Acute metabolic encephalopathy--secondary to sepsis/AKI/hypoxia compounded with underlying strokes -Seroquel discontinued --Patient's mother request transition to comfort care as of 01/07/2022  8)Elevated troponin---Sepsis and hypoxia related demand ischemia  Mild troponin elevation -troponin 34 > 35.  EKG with new diffuse T wave inversions in lead to 3 aVF V3 through V6.   --Patient's mother request transition to comfort care as of 01/07/2022  9)DM2-  A1C is 6.5 reflecting good diabetic control PTA -Patient's mother request transition to comfort care as of 01/07/2022  10)HTN --Patient's mother request transition to comfort care as of 01/07/2022  11)Social/Ethics--- plan of care discussed with patient's mother who is at bedside -Patient is legally married but he has been separated from his wife since April  2023 -He lives with his mother since April  2023, suffered recurrent strokes in July and August 2023 and ended up being admitted to SNF facility for rehab since August 2023 -Patient wife Frank Luna defer decision making to patient's mom Frank Luna -Patient has a stepdaughter Frank Luna -Patient remains a DNR/DNI -Given lack of neurological and neuromuscular improvement from strokes since July/August 2023, overall prognosis for full recovery from 2 weeks recurrent stroke related deficits Not very encouraging -Patient's mother Frank Luna... Indicates that she does Not want a feeding tube -Patient's wife defers decision-making to patient's mother's--they have been separated since April 2023 01/07/22 -Face-to-face conference with patient's mother at bedside....  Three-way conference with palliative care provider and patient's mother by phone -No significant improvement in overall mentation, too sleepy, not awake enough to attempt oral intake -She request transition to comfort care -Will need transfer to [facility] with comfort care/hospice versus transfer to residential hospice house   12)Hypokalemia/hypophosphatemia --- replace and recheck, magnesium WNL  Disposition/Need for in-Hospital Stay- patient unable to be discharged at this time due to --Will need transfer [facility] with comfort care/hospice versus transfer to residential hospice house  Status is: Inpatient   Disposition: The patient is from: SNF              Anticipated d/c is to: SNF Vs hospice              Anticipated d/c date is: 2 days Code Status :  -  Code Status: DNR   Family Communication:  Discussed with patient's mother who is at bedside DVT Prophylaxis  :   - SCDs     Lab Results  Component Value Date   PLT 82 (L) 01/07/2022   Inpatient Medications  Scheduled Meds:   Continuous Infusions:   PRN Meds:.acetaminophen **OR** acetaminophen, albuterol, antiseptic oral rinse, glycopyrrolate **OR**  glycopyrrolate **OR** glycopyrrolate, haloperidol **OR** haloperidol **OR** haloperidol lactate, LORazepam **OR** LORazepam **OR** LORazepam, morphine CONCENTRATE, ondansetron **OR** ondansetron (ZOFRAN) IV, polyvinyl alcohol   Anti-infectives (From admission, onward)    Start     Dose/Rate Route Frequency Ordered Stop   01/04/22 1400  cefTRIAXone (ROCEPHIN) 2 g in sodium chloride 0.9 % 100 mL IVPB  Status:  Discontinued        2 g 200 mL/hr over 30 Minutes Intravenous Every 24 hours 01/04/22 1057 01/07/22 1407   01/03/22 1000  ceFEPIme (MAXIPIME) 2 g in sodium chloride 0.9 % 100 mL IVPB  Status:  Discontinued        2 g 200 mL/hr over 30 Minutes Intravenous Every 8 hours 01/03/22 0922 01/04/22 1057   01/03/22 0200  vancomycin (VANCOREADY) IVPB 750 mg/150 mL  Status:  Discontinued        750 mg 150 mL/hr over 60 Minutes Intravenous Every 24 hours 01/02/22 1136 01/03/22 1029   01/02/22 1130  azithromycin (ZITHROMAX) 500 mg in sodium chloride 0.9 % 250 mL IVPB  Status:  Discontinued        500 mg 250 mL/hr over 60 Minutes Intravenous Every 24 hours 01/02/22 1038 01/07/22 1407   01/02/22 1000  ceFEPIme (MAXIPIME) 2 g in sodium chloride 0.9 % 100 mL IVPB  Status:  Discontinued        2 g 200 mL/hr over 30 Minutes Intravenous Every 12 hours 01/02/22 0109 01/03/22 0922   01/02/22 0109  vancomycin variable dose per unstable renal function (pharmacist dosing)  Status:  Discontinued         Does not apply See admin instructions 01/02/22 0109 01/02/22 1136  01/01/22 2330  ceFEPIme (MAXIPIME) 2 g in sodium chloride 0.9 % 100 mL IVPB        2 g 200 mL/hr over 30 Minutes Intravenous  Once 01/01/22 2320 01/02/22 0314   01/01/22 2315  vancomycin (VANCOREADY) IVPB 2000 mg/400 mL        2,000 mg 200 mL/hr over 120 Minutes Intravenous  Once 01/01/22 2306 01/02/22 0243   01/01/22 2245  metroNIDAZOLE (FLAGYL) IVPB 500 mg  Status:  Discontinued        500 mg 100 mL/hr over 60 Minutes Intravenous Every 12  hours 01/01/22 2235 01/02/22 1038       Subjective: Frank Rily today has no fevers, no emesis,  No chest pain,   - Face-to-face conference with patient's mother at bedside....  Three-way conference with palliative care provider and patient's mother by phone -No significant improvement in overall mentation, too sleepy, not awake enough to attempt oral intake -She request transition to comfort care -Will need transfer [facility] with comfort care/hospice versus transfer to residential hospice house  Objective: Vitals:   01/07/22 0500 01/07/22 0600 01/07/22 0846 01/07/22 1407  BP: (!) 158/74 (!) 158/72    Pulse: 67 76    Resp: 12 14    Temp:   98.7 F (37.1 C)   TempSrc:   Axillary   SpO2: 97% 97%  97%  Weight:      Height:        Intake/Output Summary (Last 24 hours) at 01/07/2022 1542 Last data filed at 01/07/2022 1452 Gross per 24 hour  Intake 2326.11 ml  Output 1560 ml  Net 766.11 ml   Filed Weights   01/05/22 0500 01/06/22 0400 01/07/22 0400  Weight: 67.1 kg 67.9 kg 69.4 kg   Physical Exam  Gen:-Sleepy on and off, wakes up to voice, no acute distress, not really verbal HEENT:- [place].AT, No sclera icterus Nose--weaned off Oxygen on 01/03/22 Neck-Supple Neck,No JVD,.  Lungs-Fair air movement, no wheezing  CV- S1, S2 normal, regular  Abd-  +ve B.Sounds, Abd Soft, No tenderness,    Extremity/Skin:- No  edema, pedal pulses present  Psych-moderate memory and cognitive deficits since his strokes over the summer Neuro-residual left-sided hemiparesis with mild Lt UE contracture which is not new, no additional new focal deficits, no tremors  Data Reviewed: I have personally reviewed following labs and imaging studies  CBC: Recent Labs  Lab 01/01/22 1802 01/02/22 0259 01/03/22 0448 01/04/22 0221 01/05/22 0400 01/07/22 0524  WBC 19.3* 18.1* 14.9* 13.9* 12.7* 7.3  NEUTROABS 16.9*  --   --   --   --   --   HGB 17.3* 14.7 11.9* 11.2* 10.6* 10.8*  HCT  52.2* 46.7 36.0* 34.7* 33.4* 33.1*  MCV 93.2 95.9 92.8 93.0 95.4 93.5  PLT 163 126* 89* 88* 83* 82*   Basic Metabolic Panel: Recent Labs  Lab 01/01/22 1802 01/02/22 0259 01/02/22 1516 01/03/22 0448 01/04/22 0221 01/05/22 0400 01/06/22 0839 01/07/22 0524  NA 147*   < > 144 149* 148* 147* 146* 144  K 5.0   < > 3.2* 2.8* 3.3* 3.5 3.2* 3.1*  CL 114*   < > 118* 122* 123* 121* 119* 117*  CO2 21*   < > 19* 21* 20* 21* 22 23  GLUCOSE 216*   < > 128* 101* 97 98 132* 124*  BUN 69*   < > 62* 50* 39* 25* 18 13  CREATININE 1.98*   < > 1.53* 1.14 0.90 0.66 0.60*  0.50*  CALCIUM 10.1   < > 8.4* 8.5* 8.4* 8.1* 7.9* 7.7*  MG 2.6*  --   --   --  2.0  --   --   --   PHOS  --   --  2.7  --  1.6*  --   --  1.6*   < > = values in this interval not displayed.   GFR: Estimated Creatinine Clearance: 90.7 mL/min (A) (by C-G formula based on SCr of 0.5 mg/dL (L)). Recent Results (from the past 240 hour(s))  Resp Panel by RT-PCR (Flu A&B, Covid) Anterior Nasal Swab     Status: None   Collection Time: 01/01/22  6:09 PM   Specimen: Anterior Nasal Swab  Result Value Ref Range Status   SARS Coronavirus 2 by RT PCR NEGATIVE NEGATIVE Final    Comment: (NOTE) SARS-CoV-2 target nucleic acids are NOT DETECTED.  The SARS-CoV-2 RNA is generally detectable in upper respiratory specimens during the acute phase of infection. The lowest concentration of SARS-CoV-2 viral copies this assay can detect is 138 copies/mL. A negative result does not preclude SARS-Cov-2 infection and should not be used as the sole basis for treatment or other patient management decisions. A negative result may occur with  improper specimen collection/handling, submission of specimen other than nasopharyngeal swab, presence of viral mutation(s) within the areas targeted by this assay, and inadequate number of viral copies(<138 copies/mL). A negative result must be combined with clinical observations, patient history, and  epidemiological information. The expected result is Negative.  Fact Sheet for Patients:  BloggerCourse.com  Fact Sheet for Healthcare Providers:  SeriousBroker.it  This test is no t yet approved or cleared by the [place] FDA and  has been authorized for detection and/or diagnosis of SARS-CoV-2 by FDA under an Emergency Use Authorization (EUA). This EUA will remain  in effect (meaning this test can be used) for the duration of the COVID-19 declaration under Section 564(b)(1) of the Act, 21 U.S.C.section 360bbb-3(b)(1), unless the authorization is terminated  or revoked sooner.       Influenza A by PCR NEGATIVE NEGATIVE Final   Influenza B by PCR NEGATIVE NEGATIVE Final    Comment: (NOTE) The Xpert Xpress SARS-CoV-2/FLU/RSV plus assay is intended as an aid in the diagnosis of influenza from Nasopharyngeal swab specimens and should not be used as a sole basis for treatment. Nasal washings and aspirates are unacceptable for Xpert Xpress SARS-CoV-2/FLU/RSV testing.  Fact Sheet for Patients: BloggerCourse.com  Fact Sheet for Healthcare Providers: SeriousBroker.it  This test is not yet approved or cleared by the [place] FDA and has been authorized for detection and/or diagnosis of SARS-CoV-2 by FDA under an Emergency Use Authorization (EUA). This EUA will remain in effect (meaning this test can be used) for the duration of the COVID-19 declaration under Section 564(b)(1) of the Act, 21 U.S.C. section 360bbb-3(b)(1), unless the authorization is terminated or revoked.  Performed at [facility], [address] [place], [place] [address]   Culture, blood (Routine X 2) w Reflex to ID Panel     Status: None   Collection Time: 01/01/22 10:29 PM   Specimen: Right Antecubital; Blood  Result Value Ref Range Status   Specimen Description RIGHT ANTECUBITAL  Final   Special  Requests   Final    BOTTLES DRAWN AEROBIC AND ANAEROBIC Blood Culture adequate volume   Culture   Final    NO GROWTH 5 DAYS Performed at [facility], [address], [place], [place]  [address]    Report Status 01/06/2022 FINAL  Final  Culture, blood (Routine X 2) w Reflex to ID Panel     Status: None   Collection Time: 01/01/22 10:29 PM   Specimen: BLOOD RIGHT HAND  Result Value Ref Range Status   Specimen Description BLOOD RIGHT HAND  Final   Special Requests   Final    BOTTLES DRAWN AEROBIC AND ANAEROBIC Blood Culture results may not be optimal due to an inadequate volume of blood received in culture bottles   Culture   Final    NO GROWTH 5 DAYS Performed at [facility], [address], [place], [place] [address]    Report Status 01/06/2022 FINAL  Final  MRSA Next Gen by PCR, Nasal     Status: None   Collection Time: 01/01/22 11:58 PM   Specimen: Nasal Mucosa; Nasal Swab  Result Value Ref Range Status   MRSA by PCR Next Gen NOT DETECTED NOT DETECTED Final    Comment: (NOTE) The GeneXpert MRSA Assay (FDA approved for NASAL specimens only), is one component of a comprehensive MRSA colonization surveillance program. It is not intended to diagnose MRSA infection nor to guide or monitor treatment for MRSA infections. Test performance is not FDA approved in patients less than 90 years old. Performed at [facility], [address], [place], [place] [address]   Culture, blood (Routine X 2) w Reflex to ID Panel     Status: None (Preliminary result)   Collection Time: 01/03/22 12:49 PM   Specimen: BLOOD  Result Value Ref Range Status   Specimen Description BLOOD BLOOD LEFT ARM  Final   Special Requests   Final    BLOOD BOTTLES DRAWN AEROBIC AND ANAEROBIC Blood Culture adequate volume BLOOD RIGHT HAND   Culture   Final    NO GROWTH 4 DAYS Performed at [facility] [address] [place], [place] [address]    Report Status PENDING  Incomplete  Culture, blood (Routine X 2) w Reflex  to ID Panel     Status: None (Preliminary result)   Collection Time: 01/03/22 12:49 PM   Specimen: BLOOD  Result Value Ref Range Status   Specimen Description BLOOD  Final   Special Requests   Final    BLOOD BOTTLES DRAWN AEROBIC AND ANAEROBIC Blood Culture adequate volume BLOOD RIGHT HAND   Culture   Final    NO GROWTH 4 DAYS Performed at [facility], [address] [place], [place] [address]    Report Status PENDING  Incomplete  Urine Culture     Status: None   Collection Time: 01/03/22  1:45 PM   Specimen: In/Out Cath Urine  Result Value Ref Range Status   Specimen Description   Final    IN/OUT CATH URINE Performed at [facility], [address], [place], [place] [address]    Special Requests   Final    NONE Performed at [facility], [address] [place], [place] [address]    Culture   Final    NO GROWTH Performed at [facility], [address] [place] [address], [place], [place] [address]    Report Status 01/05/2022 FINAL  Final    Radiology Studies:  Scheduled Meds:  Continuous Infusions:    LOS: 6 days   Shon Hale M.D on 01/07/2022 at 3:42 PM  Go to [email or web address] - for contact info  [facility] - Office  [phone]  If 7PM-7AM, please contact night-coverage [email or web address] 01/07/2022, 3:42 PM

## 2022-01-07 NOTE — TOC Progression Note (Signed)
Transition of Care [facility]) - Progression Note    Patient Details  Name: Frank Luna MRN: [id number] Date of Birth: [birth year]  Transition [facility] CM/SW Contact  Karn Cassis, [place] Phone Number: 01/07/2022, 2:13 PM  Clinical Narrative: Per palliative, family requesting [facility] or return to [facility] with hospice if does not qualify for residential. [facility] made referral and notified Keka. Will follow up in AM.       Expected Discharge Plan: Skilled Nursing Facility Barriers to Discharge: Continued Medical Work up  Expected Discharge Plan and Services Expected Discharge Plan: Skilled Nursing Facility In-house Referral: Clinical Social Work   Post Acute Care Choice: Resumption of Svcs/PTA Provider Living arrangements for the past 2 months: Skilled Nursing Facility                                       Social Determinants of Health (SDOH) Interventions    Readmission Risk Interventions     No data to display

## 2022-01-07 NOTE — Progress Notes (Signed)
Patient received in bed asleep, arousal by voice,peri care rendered, rectal tube in place with 400 ml of stool. condom cath in place with 300 ml of clear yellow urine, unable to give PO due to drowsiness, mother at bedside. Physician consulted with mother regarding plan of care. Hospice was initiated and patient is waiting for a bed on unit 300.

## 2022-01-07 NOTE — Consult Note (Addendum)
Consultation Note Date: 01/07/2022   Patient Name: Frank Luna  DOB: Mar 23, 1960  MRN: [id number]  Age / Sex: 61 y.o., male  PCP: Frank Luna Referring Physician: Shon Hale, MD  Reason for Consultation: Establishing goals of care  HPI/Patient Profile: 61 y.o. male  with past medical history of DM, stroke, HTN, resident of LTC [facility] for about one month, admitted on 01/01/2022 with acute respiratory failure with hypoxia secondary to multifocal pneumonia, sepsis, dysphagia.   Clinical Assessment and Goals of Care: I have reviewed medical records including EPIC notes, labs and imaging, received report from RN, assessed the patient.  [name] is resting quietly in bed.  He appears acutely/chronically ill and frail, older than stated age.  He does not rouse to voice or touch.  He clearly cannot make his basic needs known.  There is no family at bedside at this time.   Call to wife of 30 years, Frank Luna.  She shares that she has given decision-making responsibilities to Frank Luna, Frank Luna. Call to Luna, Frank Luna, to discuss diagnosis prognosis, GOC, EOL wishes, disposition and options. I introduced Palliative Medicine as specialized medical care for people living with serious illness. It focuses on providing relief from the symptoms and stress of a serious illness. The goal is to improve quality of life for both the patient and the family.  We discussed a brief life review of the patient.  He and Frank Luna separated in April, moved in with Luna, strokes started in 2015.  LTC three months ago to [facility], went for rehab, but then went downhill.  She can't care for him at home.  Can no longer walk.   We then focused on their current illness.  We talk about respiratory failure and aspiration pneumonia.  We talked about the treatment plan.   The natural disease  trajectory and expectations at EOL were discussed.  Frank Luna tells me that today "he didn't know me", but she shares that she did get minimal response yesterday.  She tells me that he went into long-term care to help with his mobility, but shares that he has worsened.  I attempted to elicit values and goals of care important to the patient.  I ask about quality of life, if Frank Luna would be ok with living in LTC bed bound.  Frank Luna shares that she feels that Frank Luna is not receiving excellent care in the nursing home.  She shares that her preference would be for him to be closer to her home in the ED where she could visit more often.    The difference between aggressive medical intervention and comfort care was considered in light of the patient's goals of care.  I ask Frank Luna if she were to be surprised if Frank Luna were unable to leave the hospital.  She states that she is hopeful for recovery, but would not be surprised if he is unable to.  Advanced directives, concepts specific to code status, artifical feeding and hydration, and rehospitalization were considered and  discussed.  DNR verified.  We talk about a MOLST form completed in September.  Luna Frank Luna is agreeable to short-term temporary feeding tube if this could help Khani with some recovery.  She does acknowledge that there is a risk that he could remove it.   Discussed the importance of continued conversation with family and the medical providers regarding overall plan of care and treatment options, ensuring decisions are within the context of the patient's values and GOCs.  Questions and concerns were addressed.  The family was encouraged to call with questions or concerns.  PMT will continue to support holistically.  Conference with attending, bedside nursing staff, transition of care team related to patient condition, needs, goals of care, disposition.  Addendum:   Conference with attending and Luna via phone.  Frank Luna  elects comfort and dignity at end-of-life, comfort care to start now in the hospital.  She shares that she realizes that temporary nutrition will not change things for Frank Luna.  We talk about hospice care, where this can be provided.  Her preference would be [facility], but states understanding that he may transition to hospice care at [facility] where he is under long-term care. End-of-life order set implemented.    HCPOA NEXT OF KIN   Call to wife of 30 years, Frank Luna.  She transfers decision-making to Frank Luna, Frank Luna.   Frank Luna shares that her other son died in Jun 20, 2011.  Frank Luna, Frank Luna's step daughter, helped with paperwork at LTC.  Frank Luna's natural son is prison for 3 more years, he also has 2 step sons.      SUMMARY OF RECOMMENDATIONS   At this point continue to treat the treatable but no CPR or intubation Time for outcomes We talk about temporary nasal feeding tube. Luna worries that he may pull it out, but requests trial.   Code Status/Advance Care Planning: DNR -signed MOST form on chart, completed by Luna September of this year.   We discuss MOST form, Frank Luna shared that Frank Luna ok'ed the choices on MOST.   DNR/goldenrod form completed and placed on chart.  Symptom Management:  Per hospitalist, no additional needs at this time.  Palliative Prophylaxis:  Oral Care and Turn Reposition  Additional Recommendations (Limitations, Scope, Preferences): Continue to treat, no CPR or intubation   Psycho-social/Spiritual:  Desire for further Chaplaincy support:no Additional Recommendations: Caregiving  Support/Resources and Education on Hospice  Prognosis:  Unable to determine, guarded at this time.  # months or less would not be surprising.   Discharge Planning: To Be Determined      Primary Diagnoses: Present on Admission:  Acute respiratory failure with hypoxia (HCC)  Stroke [facility])  Essential hypertension  Cognitive  impairment  AKI (acute kidney injury) (HCC)  Acute metabolic encephalopathy  Elevated troponin  SIRS (systemic inflammatory response syndrome) (HCC)   I have reviewed the medical record, interviewed the patient and family, and examined the patient. The following aspects are pertinent.  Past Medical History:  Diagnosis Date   Diabetes (HCC)    Hypertension    Stroke (HCC)    Thrombocytopenia (HCC)    Social History   Socioeconomic History   Marital status: Married    Spouse name: Not on file   Number of children: Not on file   Years of education: Not on file   Highest education level: Not on file  Occupational History   Not on file  Tobacco Use   Smoking status: Every Day  Packs/day: 1.00    Years: 45.00    Total pack years: 45.00    Types: Cigarettes   Smokeless tobacco: Never  Vaping Use   Vaping Use: Never used  Substance and Sexual Activity   Alcohol use: No   Drug use: No   Sexual activity: Not on file  Other Topics Concern   Not on file  Social History Narrative   Not on file   Social Determinants of Health   Financial Resource Strain: Not on file  Food Insecurity: No Food Insecurity (01/02/2022)   Hunger Vital Sign    Worried About Running Out of Food in the Last Year: Never true    Ran Out of Food in the Last Year: Never true  Transportation Needs: No Transportation Needs (01/02/2022)   PRAPARE - Administrator, Civil ServiceTransportation    Lack of Transportation (Medical): No    Lack of Transportation (Non-Medical): No  Physical Activity: Not on file  Stress: Not on file  Social Connections: Not on file   Family History  Problem Relation Age of Onset   Stroke Paternal Great-grandfather    Scheduled Meds:  aspirin EC  81 mg Oral Q breakfast   atorvastatin  40 mg Oral Daily   Chlorhexidine Gluconate Cloth  6 each Topical Daily   heparin  5,000 Units Subcutaneous Q8H   insulin aspart  0-9 Units Subcutaneous Q4H   mouth rinse  15 mL Mouth Rinse 4 times per day    Continuous Infusions:  0.45 % NaCl with KCl 20 mEq / L 50 mL/hr at 01/07/22 0839   azithromycin 500 mg (01/07/22 1146)   cefTRIAXone (ROCEPHIN)  IV 2 g (01/06/22 1454)   dextrose 50 mL/hr at 01/06/22 1952   potassium chloride 10 mEq (01/07/22 1142)   potassium PHOSPHATE IVPB (in mmol) Stopped (01/07/22 1147)   PRN Meds:.acetaminophen **OR** acetaminophen, albuterol, ondansetron **OR** ondansetron (ZOFRAN) IV, mouth rinse Medications Prior to Admission:  Prior to Admission medications   Medication Sig Start Date End Date Taking? Authorizing Provider  aspirin EC 81 MG EC tablet Take 1 tablet (81 mg total) by mouth daily. Swallow whole. 06/21/20  Yes Karsten Rooda, Vandana, MD  atorvastatin (LIPITOR) 40 MG tablet Take 1 tablet (40 mg total) by mouth daily at 6 PM. Patient taking differently: Take 80 mg by mouth daily at 6 PM. 07/03/20  Yes Angiulli, Mcarthur Rossettianiel J, PA-C  famotidine (PEPCID) 20 MG tablet Take 20 mg by mouth 2 (two) times daily.   Yes [provider]  FLUoxetine (PROZAC) 20 MG capsule Take 20 mg by mouth daily.   Yes [provider]  guaiFENesin (ROBITUSSIN) 100 MG/5ML liquid Take 10 mLs by mouth 3 (three) times daily.   Yes [provider]  haloperidol (HALDOL) 5 MG tablet Take 5 mg by mouth daily. 12/28/21  Yes [provider]  insulin glargine (LANTUS) 100 UNIT/ML Solostar Pen Inject 12 Units into the skin at bedtime. Patient taking differently: Inject 8 Units into the skin at bedtime. 07/03/20  Yes Angiulli, Mcarthur Rossettianiel J, PA-C  insulin lispro (HUMALOG) 100 UNIT/ML injection Inject 1-6 Units into the skin 4 (four) times daily - after meals and at bedtime. 10/24/21  Yes [provider]  losartan (COZAAR) 50 MG tablet Take 50 mg by mouth daily.   Yes [provider]  metoprolol succinate (TOPROL-XL) 50 MG 24 hr tablet Take 50 mg by mouth daily. 11/28/21  Yes [provider]  OLANZapine (ZYPREXA) 10 MG tablet Take 10 mg by mouth at  bedtime.   Yes [provider]  pantoprazole (PROTONIX) 40 MG tablet Take 40 mg by mouth daily. 12/09/21  Yes [provider]  QUEtiapine (SEROQUEL) 25 MG tablet Take 0.5 tablets (12.5 mg total) by mouth at bedtime. Patient taking differently: Take 25 mg by mouth at bedtime. 08/15/20  Yes McCue, Shanda Bumps, NP  amLODipine (NORVASC) 5 MG tablet Take 1 tablet (5 mg total) by mouth daily. Patient not taking: Reported on 01/02/2022 07/03/20   Angiulli, Mcarthur Rossetti, PA-C  cholecalciferol (VITAMIN D) 25 MCG tablet Take 1 tablet (1,000 Units total) by mouth daily. Patient not taking: Reported on 01/02/2022 07/04/20   Angiulli, Mcarthur Rossetti, PA-C  dapagliflozin propanediol (FARXIGA) 10 MG TABS tablet Take 1 tablet (10 mg total) by mouth daily. Patient not taking: Reported on 01/02/2022 07/03/20   Angiulli, Mcarthur Rossetti, PA-C  glipiZIDE (GLUCOTROL) 5 MG tablet Take 1 tablet (5 mg total) by mouth 2 (two) times daily before a meal. Patient not taking: Reported on 01/02/2022 07/04/20   Angiulli, Mcarthur Rossetti, PA-C  lisinopril (ZESTRIL) 40 MG tablet Take 1 tablet (40 mg total) by mouth daily. Patient not taking: Reported on 01/02/2022 07/03/20   Angiulli, Mcarthur Rossetti, PA-C  nicotine (NICODERM CQ - DOSED IN MG/24 HOURS) 21 mg/24hr patch NicoDerm patch 21 mg daily x1 week and 14 mg patch daily x3 weeks then 7 mg patch daily x3 weeks and stop Patient not taking: Reported on 01/02/2022 07/03/20   Angiulli, Mcarthur Rossetti, PA-C   Allergies  Allergen Reactions   Penicillins Rash    Rash per care everywhere   Review of Systems  Unable to perform ROS: Acuity of condition    Physical Exam Vitals and nursing note reviewed.     Vital Signs: BP (!) 158/72   Pulse 76   Temp 98.7 F (37.1 C) (Axillary)   Resp 14   Ht 5\' 7"  (1.702 m)   Wt 69.4 kg   SpO2 97%   BMI 23.96 kg/m  Pain Scale: PAINAD POSS *See Group Information*: S-Acceptable,Sleep, easy to arouse     SpO2: SpO2: 97 % O2 Device:SpO2: 97 % O2 Flow Rate:  .O2 Flow Rate (L/min): 2 L/min  IO: Intake/output summary:  Intake/Output Summary (Last 24 hours) at 01/07/2022 1257 Last data filed at 01/07/2022 0404 Gross per 24 hour  Intake 2526.11 ml  Output 1510 ml  Net 1016.11 ml    LBM: Last BM Date : 01/06/22 Baseline Weight: Weight: 93.4 kg Most recent weight: Weight: 69.4 kg     Palliative Assessment/Data:   Flowsheet Rows    Flowsheet Row Most Recent Value  Intake Tab   Referral Department Hospitalist  Unit at Time of Referral Intermediate Care Unit  Palliative Care Primary Diagnosis Pulmonary  Date Notified 01/06/22  Palliative Care Type New Palliative care  Reason for referral Clarify Goals of Care  Date of Admission 01/01/22  Date first seen by Palliative Care 01/07/22  # of days Palliative referral response time 1 Day(s)  # of days IP prior to Palliative referral 5  Clinical Assessment   Palliative Performance Scale Score 20%  Pain Max last 24 hours Not able to report  Pain Min Last 24 hours Not able to report  Dyspnea Max Last 24 Hours Not able to report  Dyspnea Min Last 24 hours Not able to report  Psychosocial & Spiritual Assessment   Palliative Care Outcomes        Time In: 1045 Time Out: 1200 Time Total: 75 minutes  Greater than 50%  of this time was spent counseling and coordinating care related to the above assessment and plan.  Signed by: Drue Novel, NP   Please contact Palliative Medicine Team phone at [phone] for questions and concerns.  For individual provider: See Shea Evans

## 2022-01-08 ENCOUNTER — Inpatient Hospital Stay (HOSPITAL_COMMUNITY)
Admission: RE | Admit: 2022-01-08 | Discharge: 2022-01-18 | DRG: 951 | Disposition: E | Source: Ambulatory Visit | Attending: Family Medicine | Admitting: Family Medicine

## 2022-01-08 DIAGNOSIS — Z794 Long term (current) use of insulin: Secondary | ICD-10-CM | POA: Diagnosis not present

## 2022-01-08 DIAGNOSIS — N179 Acute kidney failure, unspecified: Secondary | ICD-10-CM | POA: Diagnosis present

## 2022-01-08 DIAGNOSIS — G9341 Metabolic encephalopathy: Secondary | ICD-10-CM | POA: Diagnosis present

## 2022-01-08 DIAGNOSIS — R627 Adult failure to thrive: Secondary | ICD-10-CM | POA: Diagnosis present

## 2022-01-08 DIAGNOSIS — J9601 Acute respiratory failure with hypoxia: Secondary | ICD-10-CM | POA: Diagnosis present

## 2022-01-08 DIAGNOSIS — E870 Hyperosmolality and hypernatremia: Secondary | ICD-10-CM | POA: Diagnosis present

## 2022-01-08 DIAGNOSIS — E46 Unspecified protein-calorie malnutrition: Secondary | ICD-10-CM | POA: Diagnosis present

## 2022-01-08 DIAGNOSIS — Z79899 Other long term (current) drug therapy: Secondary | ICD-10-CM

## 2022-01-08 DIAGNOSIS — Z7982 Long term (current) use of aspirin: Secondary | ICD-10-CM

## 2022-01-08 DIAGNOSIS — E860 Dehydration: Secondary | ICD-10-CM | POA: Diagnosis present

## 2022-01-08 DIAGNOSIS — Z515 Encounter for palliative care: Principal | ICD-10-CM

## 2022-01-08 DIAGNOSIS — F17210 Nicotine dependence, cigarettes, uncomplicated: Secondary | ICD-10-CM | POA: Diagnosis present

## 2022-01-08 DIAGNOSIS — Z6823 Body mass index (BMI) 23.0-23.9, adult: Secondary | ICD-10-CM

## 2022-01-08 DIAGNOSIS — I2489 Other forms of acute ischemic heart disease: Secondary | ICD-10-CM | POA: Diagnosis present

## 2022-01-08 DIAGNOSIS — I6300 Cerebral infarction due to thrombosis of unspecified precerebral artery: Secondary | ICD-10-CM

## 2022-01-08 DIAGNOSIS — R7309 Other abnormal glucose: Secondary | ICD-10-CM | POA: Diagnosis present

## 2022-01-08 DIAGNOSIS — I10 Essential (primary) hypertension: Secondary | ICD-10-CM | POA: Diagnosis present

## 2022-01-08 DIAGNOSIS — A419 Sepsis, unspecified organism: Secondary | ICD-10-CM | POA: Diagnosis present

## 2022-01-08 DIAGNOSIS — E8809 Other disorders of plasma-protein metabolism, not elsewhere classified: Secondary | ICD-10-CM | POA: Diagnosis present

## 2022-01-08 DIAGNOSIS — Z823 Family history of stroke: Secondary | ICD-10-CM

## 2022-01-08 DIAGNOSIS — I69354 Hemiplegia and hemiparesis following cerebral infarction affecting left non-dominant side: Secondary | ICD-10-CM

## 2022-01-08 DIAGNOSIS — R1310 Dysphagia, unspecified: Secondary | ICD-10-CM | POA: Diagnosis present

## 2022-01-08 DIAGNOSIS — Z66 Do not resuscitate: Secondary | ICD-10-CM | POA: Diagnosis present

## 2022-01-08 DIAGNOSIS — I69319 Unspecified symptoms and signs involving cognitive functions following cerebral infarction: Secondary | ICD-10-CM

## 2022-01-08 DIAGNOSIS — J189 Pneumonia, unspecified organism: Secondary | ICD-10-CM | POA: Diagnosis present

## 2022-01-08 DIAGNOSIS — F17200 Nicotine dependence, unspecified, uncomplicated: Secondary | ICD-10-CM | POA: Diagnosis present

## 2022-01-08 DIAGNOSIS — E1165 Type 2 diabetes mellitus with hyperglycemia: Secondary | ICD-10-CM | POA: Diagnosis not present

## 2022-01-08 DIAGNOSIS — D696 Thrombocytopenia, unspecified: Secondary | ICD-10-CM | POA: Diagnosis present

## 2022-01-08 DIAGNOSIS — Z880 Allergy status to penicillin: Secondary | ICD-10-CM

## 2022-01-08 DIAGNOSIS — Z91199 Patient's noncompliance with other medical treatment and regimen due to unspecified reason: Secondary | ICD-10-CM

## 2022-01-08 DIAGNOSIS — Z7984 Long term (current) use of oral hypoglycemic drugs: Secondary | ICD-10-CM

## 2022-01-08 DIAGNOSIS — I639 Cerebral infarction, unspecified: Secondary | ICD-10-CM | POA: Diagnosis present

## 2022-01-08 DIAGNOSIS — E119 Type 2 diabetes mellitus without complications: Secondary | ICD-10-CM

## 2022-01-08 DIAGNOSIS — R4189 Other symptoms and signs involving cognitive functions and awareness: Secondary | ICD-10-CM | POA: Diagnosis present

## 2022-01-08 DIAGNOSIS — Z635 Disruption of family by separation and divorce: Secondary | ICD-10-CM

## 2022-01-08 DIAGNOSIS — I69391 Dysphagia following cerebral infarction: Secondary | ICD-10-CM

## 2022-01-08 DIAGNOSIS — Z751 Person awaiting admission to adequate facility elsewhere: Secondary | ICD-10-CM

## 2022-01-08 DIAGNOSIS — I679 Cerebrovascular disease, unspecified: Secondary | ICD-10-CM | POA: Diagnosis present

## 2022-01-08 LAB — CULTURE, BLOOD (ROUTINE X 2)
Culture: NO GROWTH
Culture: NO GROWTH

## 2022-01-08 MED ORDER — ACETAMINOPHEN 650 MG RE SUPP: 650.0000 mg | Freq: Four times a day (QID) | RECTAL | Status: DC | PRN

## 2022-01-08 MED ORDER — SODIUM CHLORIDE 0.9% FLUSH
3.0000 mL | Freq: Two times a day (BID) | INTRAVENOUS | Status: DC
  Administered 2022-01-09 – 2022-01-12 (×7): 3 mL via INTRAVENOUS

## 2022-01-08 MED ORDER — LORAZEPAM 2 MG/ML IJ SOLN
1.0000 mg | INTRAMUSCULAR | Status: DC | PRN
  Administered 2022-01-09 – 2022-01-10 (×2): 1 mg via INTRAVENOUS
  Filled 2022-01-08 (×2): qty 1

## 2022-01-08 MED ORDER — ONDANSETRON 4 MG PO TBDP: 4.0000 mg | ORAL_TABLET | Freq: Four times a day (QID) | ORAL | Status: DC | PRN

## 2022-01-08 MED ORDER — BISACODYL 10 MG RE SUPP: 10.0000 mg | Freq: Every day | RECTAL | Status: DC | PRN

## 2022-01-08 MED ORDER — GLYCOPYRROLATE 0.2 MG/ML IJ SOLN
0.2000 mg | INTRAMUSCULAR | Status: DC | PRN
  Filled 2022-01-08 (×2): qty 1

## 2022-01-08 MED ORDER — GLYCOPYRROLATE 0.2 MG/ML IJ SOLN
0.2000 mg | INTRAMUSCULAR | Status: DC | PRN
  Administered 2022-01-10 – 2022-01-11 (×5): 0.2 mg via INTRAVENOUS
  Filled 2022-01-08 (×3): qty 1

## 2022-01-08 MED ORDER — LORAZEPAM 1 MG PO TABS: 1.0000 mg | ORAL_TABLET | ORAL | Status: DC | PRN

## 2022-01-08 MED ORDER — SODIUM CHLORIDE 0.9 % IV SOLN: 250.0000 mL | INTRAVENOUS | Status: DC | PRN

## 2022-01-08 MED ORDER — POLYVINYL ALCOHOL 1.4 % OP SOLN: 1.0000 [drp] | Freq: Four times a day (QID) | OPHTHALMIC | Status: DC | PRN

## 2022-01-08 MED ORDER — BIOTENE DRY MOUTH MT LIQD: 15.0000 mL | OROMUCOSAL | Status: DC | PRN

## 2022-01-08 MED ORDER — GLYCOPYRROLATE 1 MG PO TABS: 1.0000 mg | ORAL_TABLET | ORAL | Status: DC | PRN

## 2022-01-08 MED ORDER — HALOPERIDOL 0.5 MG PO TABS: 0.5000 mg | ORAL_TABLET | ORAL | Status: DC | PRN

## 2022-01-08 MED ORDER — ONDANSETRON HCL 4 MG/2ML IJ SOLN: 4.0000 mg | Freq: Four times a day (QID) | INTRAMUSCULAR | Status: DC | PRN

## 2022-01-08 MED ORDER — SODIUM CHLORIDE 0.9% FLUSH: 3.0000 mL | INTRAVENOUS | Status: DC | PRN

## 2022-01-08 MED ORDER — SODIUM CHLORIDE 0.9 % IV SOLN: 12.5000 mg | Freq: Four times a day (QID) | INTRAVENOUS | Status: DC | PRN

## 2022-01-08 MED ORDER — HALOPERIDOL LACTATE 2 MG/ML PO CONC: 0.5000 mg | ORAL | Status: DC | PRN

## 2022-01-08 MED ORDER — ACETAMINOPHEN 325 MG PO TABS: 650.0000 mg | ORAL_TABLET | Freq: Four times a day (QID) | ORAL | Status: DC | PRN

## 2022-01-08 MED ORDER — HALOPERIDOL LACTATE 5 MG/ML IJ SOLN: 0.5000 mg | INTRAMUSCULAR | Status: DC | PRN

## 2022-01-08 MED ORDER — FENTANYL CITRATE PF 50 MCG/ML IJ SOSY
25.0000 ug | PREFILLED_SYRINGE | INTRAMUSCULAR | Status: DC | PRN
  Administered 2022-01-08 – 2022-01-09 (×2): 25 ug via INTRAVENOUS
  Filled 2022-01-08 (×2): qty 1

## 2022-01-08 NOTE — Progress Notes (Signed)
Palliative: Frank Luna is lying quietly in bed.  He was made comfort care yesterday.  End-of-life order set in place.  He appears acutely/chronically ill and very frail.  He does not respond in any meaningful way to voice or touch.  He appears to be actively dying.  Clearly cannot make his needs known, and there is no family at bedside at this time.  Nursing staff shares that family was present earlier.    Comfort channel placed on TV, lowered lights in room.  Frank Luna has been on oxygen which is removed.  Symptom management for increased RR/WOB discussed with bedside nursing staff.  Conference with attending, bedside nursing staff, transition of care team related to patient condition, needs, goals of care, disposition.  Plan: Comfort and dignity at end-of-life, full comfort care.  Accepted into [facility] hosp[facility] with [facility] but no beds available.  Prognosis: Hours to days.  At this point seems too unstable for transport.  Anticipate in-hospital death.  50 minutes Frank Carmel, NP Palliative medicine team Team phone [phone] Greater than 50% of this time was spent counseling and coordinating care related to the above assessment and plan.

## 2022-01-08 NOTE — Discharge Summary (Signed)
[name], is a 61 y.o. male  DOB 10-12-1960  MRN [id number].  Admission date:  01/01/2022  Admitting Physician  Onnie Boer, MD  Discharge Date:  12/24/2021   Primary MD  Graylin Shiver Bylas  Recommendations for primary care physician for things to follow:   Awaiting Transfer to Residential Hospice--  Admission Diagnosis  Uremia [N19] Acute respiratory failure with hypoxia (HCC) [J96.01] AKI (acute kidney injury) (HCC) [N17.9] Acute hypoxic respiratory failure (HCC) [J96.01]   Discharge Diagnosis  Uremia [N19] Acute respiratory failure with hypoxia (HCC) [J96.01] AKI (acute kidney injury) (HCC) [N17.9] Acute hypoxic respiratory failure (HCC) [J96.01]    Principal Problem:   Acute respiratory failure with hypoxia (HCC) Active Problems:   AKI (acute kidney injury) (HCC)   Acute metabolic encephalopathy   SIRS (systemic inflammatory response syndrome) (HCC)   Essential hypertension   Cognitive impairment   Diabetes (HCC)   Stroke (HCC)   Elevated troponin      Past Medical History:  Diagnosis Date   Diabetes (HCC)    Hypertension    Stroke (HCC)    Thrombocytopenia (HCC)     History reviewed. No pertinent surgical history.     HPI  from the history and physical done on the day of admission:   Chief Complaint: Difficulty breathing, altered mental status   HPI: Frank Luna is a 61 y.o. male with medical history significant for diabetes mellitus, stroke, hypertension. Patient was brought to the ED from nursing home with reports of difficulty breathing, and cough.  Over the past few days, patient has had decreased oral intake, and decreased responsiveness.  ED provider talked to patient's mother who is his legal guardian, confirmed the above history, and that patient is DNR/DNI.  At baseline, patient cannot ambulate, or feed himself, and is dependent for ADLs. They  tried to feed him yesterday, but he would not swallow. On my evaluation, patient is unable to provide history, somnolent/nearly obtunded, eyes are open, with some flexion movement of his extremities to sternal rub.   ED Course: O2 sats 87% on 15 L, he was placed on nonrebreather sats greater than 90%.  Tmax 99.1.  Respiratory rate 29-40.  Heart rate 110-120. Leukocytosis of 19.3. Creatinine elevated 1.98.  Sodium 147.  ABG shows pH of 7.49, PCO2 of 32, PO2 of 59.  Troponin 34 > 85. Chest X-ray showing bronchitic changes. Head CT showing remote infarcts that are new from prior exam.  Negative for acute abnormality. 2 L bolus given hospitalist admit.   Review of Systems: Unable to assess due to altered mental status.    Hospital Course:   Brief Narrative:  61 y.o. male with medical history significant for diabetes mellitus, stroke, hypertension  Admitted on 01/01/2022 with sepsis secondary to sepsis secondary to multifocal pneumonia left more than right with acute hypoxic respiratory failure requiring up to 15 L of oxygen on nonrebreather bag initially as well as acute metabolic encephalopathy and AKI - 01/07/22 -Face-to-face conference with patient's mother at bedside....  Three-way  conference with palliative care provider and patient's mother by phone -No significant improvement in overall mentation, too sleepy, not awake enough to attempt oral intake -She request transition to comfort care  02-01-2022 Appears comfortable -Awaiting transfer to residential hospice, no bed available at residential hospice at this time, accepted by hospice team already, changed to GIP status on 12/19/2021 pending transfer to residential hospice     -Assessment and Plan: 1)Acute Respiratory Failure with Hypoxia  secondary to multifocal pneumonia left more than right with acute hypoxic respiratory failure requiring up to 15 L of oxygen on nonrebreather bag initially--- -CT chest without contrast with  multifocal pneumonia mostly on the left side -Added Azithromycin on 01/02/22 -Treated with cefepime ,  -de-escalated to Rocephin on 01/04/2022 -Stopped Vancomycin on 01/03/22 -Treated with bronchodilators and mucolytics -Hypoxia has resolved --Oxygen Weaned off on 01/03/22 --Patient's mother requested transition to comfort care as of 01/07/2022 -Antibiotics have been discontinued   2)Sepsis secondary to multifocal pneumonia left more than right with acute hypoxic respiratory failure  WBC 19.3 >>18.1>>14.9>>13.9>>12.7 PCT 0.24 >>0.32>>0.42 -Lactic Acid 5.4 >> 6.1  -Management as above #1 -Urine and blood cultures NGTD --Patient's mother requested transition to comfort care as of 01/07/2022   3) dysphagia--- speech eval appreciated patient unable to have any oral intake -N.p.o. status recommended -Family not keen on PEG tube --Patient's mother requested transition to comfort care as of 01/07/2022   4)Dehydration/Hypernatremia------due to poor oral intake   --Patient is n.p.o. due to swallowing difficulties --Patient's mother requested transition to comfort care as of 01/07/2022   5)AKI (acute kidney injury) (HCC) On admission Cr elevated at 1.98, BUN of 69.  -As per Care Everywhere creatinine was 1.0 on 10/24/2021 -Creatinine and renal function improved with hydration --Patient's mother requested transition to comfort care as of 01/07/2022   6)Recurrent Strokes----MRI Brain from 09/27/21 showed Acute perforator infarct involving the left basal ganglia,  Evolving subacute infarct in the right corona radiata, Remote lacunar infarcts, chronic microvascular ischemic disease and cerebral atrophy -Patient had speech, swallowing, cognitive deficits as well as left-sided hemiparesis--- since July 2023 got worse in August 2023 despite rehab--he has been unable to make significant improvement -Per patient's mother patient has had moderate memory and cognitive deficits since his strokes over the  summer -CT head at this time and neuro exam at this time failed to demonstrate significant concerns for new additional strokes --Patient's mother requested transition to comfort care as of 01/07/2022   7)Acute metabolic encephalopathy--secondary to sepsis/AKI/hypoxia compounded with underlying strokes -Seroquel discontinued --Patient's mother requested transition to comfort care as of 01/07/2022   8)Elevated troponin---Sepsis and hypoxia related demand ischemia  Mild troponin elevation -troponin 34 > 35.  EKG with new diffuse T wave inversions in lead to 3 aVF V3 through V6.   --Patient's mother request transition to comfort care as of 01/07/2022   9)DM2-  A1C is 6.5 reflecting good diabetic control PTA -Patient's mother requested transition to comfort care as of 01/07/2022   10)HTN --Patient's mother requested transition to comfort care as of 01/07/2022   11)Social/Ethics--- plan of care discussed with patient's mother who is at bedside -Patient is legally married but he has been separated from his wife since April 2023 -He lives with his mother since April 2023, suffered recurrent strokes in July and August 2023 and ended up being admitted to SNF facility for rehab since August 2023 -Patient wife Talbert ForestShirley defer decision making to patient's mom Ms. Rhunette CroftMildred -Patient has a stepdaughter Dois DavenportSandra -Patient remains  a DNR/DNI -Given lack of neurological and neuromuscular improvement from strokes since July/August 2023, overall prognosis for full recovery from 2 weeks recurrent stroke related deficits Not very encouraging -Patient's mother [name]... Indicates that she does Not want a feeding tube -Patient's wife defers decision-making to patient's mother's--they have been separated since April 2023 01/07/22 -Face-to-face conference with patient's mother at bedside....  Three-way conference with palliative care provider and patient's mother by phone -No significant improvement in overall  mentation, too sleepy, not awake enough to attempt oral intake --Patient's mother requested transition to comfort care as of 01/07/2022  02/03/2022 Appears comfortable -Awaiting transfer to residential hospice, no bed available at residential hospice at this time, accepted by hospice team already, changed to GIP status on 02-03-22 pending transfer to residential hospice   Disposition: The patient is from: SNF              Anticipated d/c is to: hospice           Code Status :  -  Code Status: DNR    Family Communication:  Discussed with patient's mother who is at bedside    Discharge Medications    Major procedures and Radiology Reports - PLEASE review detailed and final reports for all details, in brief -  DG CHEST PORT 1 VIEW  Result Date: 01/04/2022 CLINICAL DATA:  [id number] with dyspnea and respiratory abnormalities. Follow-up pneumonia and sepsis. EXAM: PORTABLE CHEST 1 VIEW COMPARISON:  Portable chest and chest CT both 01/01/2022 FINDINGS: 4:56 a.m. There is interval worsening of patchy airspace disease in the left lower lobe. There was faint peribronchovascular nodularity on CT in the right upper and middle lobes but it is not well redemonstrated radiographically if it is still present. The remaining lungs appear generally clear. There is overlying monitor wiring. Heart size and vasculature are normal and no pleural effusion is seen. There is calcification in the aortic arch. Stable mediastinum.  Osteopenia and thoracic spondylosis. IMPRESSION: 1. Interval worsening of patchy airspace disease in the left lower lobe. 2. Faint peribronchovascular nodularity on CT in the right upper and middle lobes is not well redemonstrated radiographically if it is still present. The rest of the lungs are generally clear. Electronically Signed   By: Almira Bar M.D.   On: 01/04/2022 06:03   CT CHEST WO CONTRAST  Result Date: 01/01/2022 CLINICAL DATA:  Acute hypoxic respiratory failure EXAM: CT CHEST  WITHOUT CONTRAST TECHNIQUE: Multidetector CT imaging of the chest was performed following the standard protocol without IV contrast. RADIATION DOSE REDUCTION: This exam was performed according to the departmental dose-optimization program which includes automated exposure control, adjustment of the mA and/or kV according to patient size and/or use of iterative reconstruction technique. COMPARISON:  Chest radiograph dated 01/01/2022 FINDINGS: Cardiovascular: Heart is normal in size.  No pericardial effusion. No evidence thoracic aortic aneurysm. Atherosclerotic calcifications of the aortic arch. Three vessel coronary atherosclerosis. Mediastinum/Nodes: No suspicious mediastinal lymphadenopathy. Visualized thyroid is unremarkable. Lungs/Pleura: Evaluation of the lung parenchyma is constrained by respiratory motion. Peribronchovascular nodularity in the right upper lobe, central right middle lobe, and superior segment right lower lobe. Additional patchy left lower lobe opacity. These findings are compatible with multifocal pneumonia, although mild superimposed left lower lobe atelectasis is possible. Underlying mild centrilobular emphysematous changes are suspected, although poorly evaluated due to motion degradation. No pleural effusion or pneumothorax. Upper Abdomen: Visualized upper abdomen is grossly unremarkable, noting vascular calcifications. Musculoskeletal: Degenerative changes of the visualized thoracolumbar spine. IMPRESSION: Motion degraded images.  Multifocal pneumonia, left lower lobe predominant. Aortic Atherosclerosis (ICD10-I70.0) and Emphysema (ICD10-J43.9). Electronically Signed   By: Charline Bills M.D.   On: 01/01/2022 23:11   CT Head Wo Contrast  Result Date: 01/01/2022 CLINICAL DATA:  Delirium EXAM: CT HEAD WITHOUT CONTRAST TECHNIQUE: Contiguous axial images were obtained from the base of the skull through the vertex without intravenous contrast. RADIATION DOSE REDUCTION: This exam was  performed according to the departmental dose-optimization program which includes automated exposure control, adjustment of the mA and/or kV according to patient size and/or use of iterative reconstruction technique. COMPARISON:  08/30/2020 FINDINGS: Brain: Normal anatomic configuration. Parenchymal volume loss is commensurate with the patient's age. Moderate periventricular white matter changes are present likely reflecting the sequela of small vessel ischemia. Remote lacunar infarcts within the carina radiology and thalami bilaterally are identified with remote appearing infarcts within the left thalamus and posterior left corona radiata new in the interval since prior examination. No abnormal intra or extra-axial mass lesion or fluid collection. No abnormal mass effect or midline shift. No evidence of acute intracranial hemorrhage or infarct. Ventricular size is normal. Cerebellum unremarkable. Vascular: No asymmetric hyperdense vasculature at the skull base. Skull: Intact Sinuses/Orbits: Paranasal sinuses are clear. Orbits are unremarkable. Other: Fluid is seen within the left mastoid air cells without superimposed osseous erosion. Middle ear cavities and right mastoid air cells are clear. IMPRESSION: 1. No acute intracranial hemorrhage or infarct. 2. Moderate senescent change. 3. Remote appearing infarcts within the left thalamus and posterior left corona radiata, new in the interval since prior examination. 4. Left mastoid effusion. Electronically Signed   By: Helyn Numbers M.D.   On: 01/01/2022 21:22   DG Chest Port 1 View  Result Date: 01/01/2022 CLINICAL DATA:  Respiratory distress EXAM: PORTABLE CHEST 1 VIEW COMPARISON:  02/14/2018 FINDINGS: Mild bronchitic changes. No acute airspace disease, pleural effusion or pneumothorax. Stable cardiomediastinal silhouette. Aortic atherosclerosis IMPRESSION: Bronchitic changes without focal airspace disease Electronically Signed   By: Jasmine Pang M.D.   On:  01/01/2022 18:40    Micro Results   Recent Results (from the past 240 hour(s))  Resp Panel by RT-PCR (Flu A&B, Covid) Anterior Nasal Swab     Status: None   Collection Time: 01/01/22  6:09 PM   Specimen: Anterior Nasal Swab  Result Value Ref Range Status   SARS Coronavirus 2 by RT PCR NEGATIVE NEGATIVE Final    Comment: (NOTE) SARS-CoV-2 target nucleic acids are NOT DETECTED.  The SARS-CoV-2 RNA is generally detectable in upper respiratory specimens during the acute phase of infection. The lowest concentration of SARS-CoV-2 viral copies this assay can detect is 138 copies/mL. A negative result does not preclude SARS-Cov-2 infection and should not be used as the sole basis for treatment or other patient management decisions. A negative result may occur with  improper specimen collection/handling, submission of specimen other than nasopharyngeal swab, presence of viral mutation(s) within the areas targeted by this assay, and inadequate number of viral copies(<138 copies/mL). A negative result must be combined with clinical observations, patient history, and epidemiological information. The expected result is Negative.  Fact Sheet for Patients:  BloggerCourse.com  Fact Sheet for Healthcare Providers:  SeriousBroker.it  This test is no t yet approved or cleared by the [place] FDA and  has been authorized for detection and/or diagnosis of SARS-CoV-2 by FDA under an Emergency Use Authorization (EUA). This EUA will remain  in effect (meaning this test can be used) for the duration of the COVID-19  declaration under Section 564(b)(1) of the Act, 21 U.S.C.section 360bbb-3(b)(1), unless the authorization is terminated  or revoked sooner.       Influenza A by PCR NEGATIVE NEGATIVE Final   Influenza B by PCR NEGATIVE NEGATIVE Final    Comment: (NOTE) The Xpert Xpress SARS-CoV-2/FLU/RSV plus assay is intended as an aid in the  diagnosis of influenza from Nasopharyngeal swab specimens and should not be used as a sole basis for treatment. Nasal washings and aspirates are unacceptable for Xpert Xpress SARS-CoV-2/FLU/RSV testing.  Fact Sheet for Patients: BloggerCourse.com  Fact Sheet for Healthcare Providers: SeriousBroker.it  This test is not yet approved or cleared by the [place] FDA and has been authorized for detection and/or diagnosis of SARS-CoV-2 by FDA under an Emergency Use Authorization (EUA). This EUA will remain in effect (meaning this test can be used) for the duration of the COVID-19 declaration under Section 564(b)(1) of the Act, 21 U.S.C. section 360bbb-3(b)(1), unless the authorization is terminated or revoked.  Performed at [facility], [address] [place], [place] [address]   Culture, blood (Routine X 2) w Reflex to ID Panel     Status: None   Collection Time: 01/01/22 10:29 PM   Specimen: Right Antecubital; Blood  Result Value Ref Range Status   Specimen Description RIGHT ANTECUBITAL  Final   Special Requests   Final    BOTTLES DRAWN AEROBIC AND ANAEROBIC Blood Culture adequate volume   Culture   Final    NO GROWTH 5 DAYS Performed at [facility], [address] [place], [place] [address]    Report Status 01/06/2022 FINAL  Final  Culture, blood (Routine X 2) w Reflex to ID Panel     Status: None   Collection Time: 01/01/22 10:29 PM   Specimen: BLOOD RIGHT HAND  Result Value Ref Range Status   Specimen Description BLOOD RIGHT HAND  Final   Special Requests   Final    BOTTLES DRAWN AEROBIC AND ANAEROBIC Blood Culture results may not be optimal due to an inadequate volume of blood received in culture bottles   Culture   Final    NO GROWTH 5 DAYS Performed at [facility], [address] [place], [place] [address]    Report Status 01/06/2022 FINAL  Final  MRSA Next Gen by PCR, Nasal     Status: None   Collection Time:  01/01/22 11:58 PM   Specimen: Nasal Mucosa; Nasal Swab  Result Value Ref Range Status   MRSA by PCR Next Gen NOT DETECTED NOT DETECTED Final    Comment: (NOTE) The GeneXpert MRSA Assay (FDA approved for NASAL specimens only), is one component of a comprehensive MRSA colonization surveillance program. It is not intended to diagnose MRSA infection nor to guide or monitor treatment for MRSA infections. Test performance is not FDA approved in patients less than 43 years old. Performed at [facility], [address] [place], [place] [address]   Culture, blood (Routine X 2) w Reflex to ID Panel     Status: None   Collection Time: 01/03/22 12:49 PM   Specimen: BLOOD  Result Value Ref Range Status   Specimen Description BLOOD BLOOD LEFT ARM  Final   Special Requests   Final    BLOOD BOTTLES DRAWN AEROBIC AND ANAEROBIC Blood Culture adequate volume BLOOD RIGHT HAND   Culture   Final    NO GROWTH 5 DAYS Performed at [facility], [address], [place], [place] [address]    Report Status 01-25-22  FINAL  Final  Culture, blood (Routine X 2) w Reflex to ID Panel     Status: None   Collection Time: 01/03/22 12:49 PM   Specimen: BLOOD  Result Value Ref Range Status   Specimen Description BLOOD  Final   Special Requests   Final    BLOOD BOTTLES DRAWN AEROBIC AND ANAEROBIC Blood Culture adequate volume BLOOD RIGHT HAND   Culture   Final    NO GROWTH 5 DAYS Performed at [facility], [address], [place], [place] [address]    Report Status 12/26/2021 FINAL  Final  Urine Culture     Status: None   Collection Time: 01/03/22  1:45 PM   Specimen: In/Out Cath Urine  Result Value Ref Range Status   Specimen Description   Final    IN/OUT CATH URINE Performed at [facility], [address], [place], [place] [address]    Special Requests   Final    NONE Performed at [facility], [address] [place], [place] [address]    Culture   Final    NO GROWTH Performed at [facility], [address], [place], [place] [address]    Report Status 01/05/2022 FINAL  Final   Today   Subjective    Frank Luna appears comfortable -Awaiting transfer to residential hospice  -His mother is at bedside  Patient has been seen and examined prior to discharge   Objective   Blood pressure (!) 141/94, pulse 91, temperature 98.2 F (36.8 C), temperature source Oral, resp. rate (!) 24, height 5\' 7"  (1.702 m), weight 69.4 kg, SpO2 92 %.   Intake/Output Summary (Last 24 hours) at 12/28/2021 1450 Last data filed at 01/15/2022 1425 Gross per 24 hour  Intake 492.82 ml  Output 1435 ml  Net -942.18 ml    Exam Gen:-Sleepy on and off, wakes up to voice, no acute distress, not really verbal HEENT:- [place].AT, No sclera icterus Nose--weaned off Oxygen on 01/03/22 Neck-Supple Neck,No JVD,.  Lungs-Fair air movement, no wheezing  CV- S1, S2 normal, regular  Abd-  +ve B.Sounds, Abd Soft, No tenderness,    Extremity/Skin:- No  edema, pedal pulses present  Psych-moderate memory and cognitive deficits since his strokes over the summer Neuro-residual left-sided hemiparesis with mild Lt UE contracture which is not new, no additional new focal deficits, no tremors   Data Review   CBC w Diff:  Lab Results  Component Value Date   WBC 7.3 01/07/2022   HGB 10.8 (L) 01/07/2022   HCT 33.1 (L) 01/07/2022   PLT 82 (L) 01/07/2022   LYMPHOPCT 4 01/01/2022   MONOPCT 7 01/01/2022   EOSPCT 2 01/01/2022   BASOPCT 0 01/01/2022   CMP:  Lab Results  Component Value Date   NA 144 01/07/2022   K 3.1 (L) 01/07/2022   CL 117 (H) 01/07/2022   CO2 23 01/07/2022   BUN 13 01/07/2022   CREATININE 0.50 (L) 01/07/2022   PROT 6.1 (L) 06/22/2020   ALBUMIN 1.9 (L) 01/07/2022   BILITOT 0.9 06/22/2020   ALKPHOS 83 06/22/2020   AST 31 06/22/2020   ALT 57 (H) 06/22/2020   Total Discharge time is about 33 minutes  08/22/2020 M.D on 01/17/2022 at 2:50 PM  Go to [email or web address] -  for contact  info  [facility] - Office  [phone]

## 2022-01-08 NOTE — H&P (Signed)
Patient Demographics:    Frank Luna, is a 61 y.o. male  MRN: [id number]   DOB - Jul 18, 1960  Admit Date - 12/24/2021  Outpatient Primary MD for the patient is [name]   Assessment & Plan:   Assessment and Plan: 1)Acute Respiratory Failure with Hypoxia  secondary to multifocal pneumonia left more than right with acute hypoxic respiratory failure requiring up to 15 L of oxygen on nonrebreather bag initially--- -CT chest without contrast with multifocal pneumonia mostly on the left side -Added Azithromycin on 01/02/22 -Treated with cefepime ,  -de-escalated to Rocephin on 01/04/2022 -Stopped Vancomycin on 01/03/22 -Treated with bronchodilators and mucolytics -Hypoxia has resolved --Oxygen Weaned off on 01/03/22 --Patient's mother requested transition to comfort care as of 01/07/2022 -Antibiotics have been discontinued   2)Sepsis secondary to multifocal pneumonia left more than right with acute hypoxic respiratory failure  WBC 19.3 >>18.1>>14.9>>13.9>>12.7 PCT 0.24 >>0.32>>0.42 -Lactic Acid 5.4 >> 6.1  -Management as above #1 -Urine and blood cultures NGTD --Patient's mother requested transition to comfort care as of 01/07/2022   3) dysphagia--- speech eval appreciated patient unable to have any oral intake -N.p.o. status recommended -Family not keen on PEG tube --Patient's mother requested transition to comfort care as of 01/07/2022   4)Dehydration/Hypernatremia------due to poor oral intake   --Patient is n.p.o. due to swallowing difficulties --Patient's mother requested transition to comfort care as of 01/07/2022   5)AKI (acute kidney injury) (HCC) On admission Cr elevated at 1.98, BUN of 69.  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,  Evolving subacute infarct in the right corona radiata, Remote lacunar infarcts, chronic microvascular ischemic disease and cerebral atrophy -Patient had speech, swallowing, cognitive deficits as well as left-sided hemiparesis--- since July 2023 got worse in August 2023 despite rehab--he has been unable to make significant improvement -Per patient's mother patient has had moderate memory and cognitive deficits since his strokes over the summer -CT head at this time and neuro exam at this time failed to demonstrate significant concerns for new additional strokes --Patient's mother requested transition to comfort care as of 01/07/2022   7)Acute metabolic encephalopathy--secondary to sepsis/AKI/hypoxia compounded with underlying strokes -Seroquel discontinued --Patient's mother requested transition to comfort care as of 01/07/2022   8)Elevated troponin---Sepsis and hypoxia related demand ischemia  Mild troponin elevation -troponin 34 > 35.  EKG with new diffuse T wave inversions in lead to 3 aVF V3 through V6.   --Patient's mother request transition to comfort care as of 01/07/2022   9)DM2-  A1C is 6.5 reflecting good diabetic control PTA -Patient's mother requested transition to comfort care as of 01/07/2022   10)HTN --Patient's mother requested transition to comfort care as of 01/07/2022   11)Social/Ethics--- plan of care discussed with  patient's mother who is at bedside -Patient is legally married but he has been separated from his wife since April 2023 -He lives with his mother since April 2023, suffered recurrent strokes in July and August 2023 and ended up being admitted to SNF facility for rehab since August 2023 -Patient wife [name] defer decision making to patient's mom Ms.  [name] -Patient has a stepdaughter [name] -Patient remains a DNR/DNI -Given lack of neurological and neuromuscular improvement from strokes since July/August 2023, overall prognosis for full recovery from 2 weeks recurrent stroke related deficits Not very encouraging -Patient's mother [name]... Indicates that she does Not want a feeding tube -Patient's wife defers decision-making to patient's mother's--they have been separated since April 2023  01/07/22 -Face-to-face conference with patient's mother at bedside....  Three-way conference with palliative care provider and patient's mother by phone -No significant improvement in overall mentation, too sleepy, not awake enough to attempt oral intake --Patient's mother requested transition to comfort care as of 01/07/2022   February 03, 2022 Appears comfortable -Awaiting transfer to residential hospice, no bed available at residential hospice at this time, accepted by hospice team already, changed to GIP status on February 03, 2022 pending transfer to residential hospice   Disposition: The patient is from: SNF              Anticipated d/c is to: hospice           Code Status :  -  Code Status: DNR    Family Communication:  Discussed with patient's mother who is at bedside  02-03-22 Appears comfortable -Awaiting transfer to residential hospice, no bed available at residential hospice at this time, accepted by hospice team already, changed to GIP status on 02/03/2022 pending transfer to residential hospice  Status is: Inpatient  Remains inpatient appropriate because:   Dispo: The patient is from: Home              Anticipated d/c is to:  Hospice              Anticipated d/c date is: 1 day   With History of - Reviewed by me  Past Medical History:  Diagnosis Date   Diabetes (HCC)    Hypertension    Stroke (HCC)    Thrombocytopenia (HCC)         HPI:    Frank Luna  is a 61 y.o. male - 02-03-2022 Appears comfortable -Awaiting transfer  to residential hospice, no bed available at residential hospice at this time, accepted by hospice team already, changed to GIP status on 02/03/2022 pending transfer to residential hospice    Review of systems:    In addition to the HPI above,   A full Review of  Systems was done, all other systems reviewed are negative except as noted above in HPI , .    Social History:  Reviewed by me    Social History   Tobacco Use   Smoking status: Every Day    Packs/day: 1.00    Years: 45.00    Total pack years: 45.00    Types: Cigarettes   Smokeless tobacco: Never  Substance Use Topics   Alcohol use: No    Family History :  Reviewed by me    Family History  Problem Relation Age of Onset   Stroke Paternal Great-grandfather      Home Medications:   Prior to Admission medications   Medication Sig Start Date End Date Taking? Authorizing Provider  amLODipine (NORVASC) 5 MG tablet Take 1 tablet (  5 mg total) by mouth daily. Patient not taking: Reported on 01/02/2022 07/03/20   Angiulli, Mcarthur Rossetti, PA-C  aspirin EC 81 MG EC tablet Take 1 tablet (81 mg total) by mouth daily. Swallow whole. 06/21/20   Karsten Ro, MD  atorvastatin (LIPITOR) 40 MG tablet Take 1 tablet (40 mg total) by mouth daily at 6 PM. Patient taking differently: Take 80 mg by mouth daily at 6 PM. 07/03/20   Angiulli, Mcarthur Rossetti, PA-C  cholecalciferol (VITAMIN D) 25 MCG tablet Take 1 tablet (1,000 Units total) by mouth daily. Patient not taking: Reported on 01/02/2022 07/04/20   Angiulli, Mcarthur Rossetti, PA-C  dapagliflozin propanediol (FARXIGA) 10 MG TABS tablet Take 1 tablet (10 mg total) by mouth daily. Patient not taking: Reported on 01/02/2022 07/03/20   Angiulli, Mcarthur Rossetti, PA-C  famotidine (PEPCID) 20 MG tablet Take 20 mg by mouth 2 (two) times daily.    [provider]  FLUoxetine (PROZAC) 20 MG capsule Take 20 mg by mouth daily.    [provider]  glipiZIDE (GLUCOTROL) 5 MG tablet Take 1 tablet (5 mg  total) by mouth 2 (two) times daily before a meal. Patient not taking: Reported on 01/02/2022 07/04/20   Angiulli, Mcarthur Rossetti, PA-C  guaiFENesin (ROBITUSSIN) 100 MG/5ML liquid Take 10 mLs by mouth 3 (three) times daily.    [provider]  haloperidol (HALDOL) 5 MG tablet Take 5 mg by mouth daily. 12/28/21   [provider]  insulin glargine (LANTUS) 100 UNIT/ML Solostar Pen Inject 12 Units into the skin at bedtime. Patient taking differently: Inject 8 Units into the skin at bedtime. 07/03/20   Angiulli, Mcarthur Rossetti, PA-C  insulin lispro (HUMALOG) 100 UNIT/ML injection Inject 1-6 Units into the skin 4 (four) times daily - after meals and at bedtime. 10/24/21   [provider]  lisinopril (ZESTRIL) 40 MG tablet Take 1 tablet (40 mg total) by mouth daily. Patient not taking: Reported on 01/02/2022 07/03/20   Angiulli, Mcarthur Rossetti, PA-C  losartan (COZAAR) 50 MG tablet Take 50 mg by mouth daily.    [provider]  metoprolol succinate (TOPROL-XL) 50 MG 24 hr tablet Take 50 mg by mouth daily. 11/28/21   [provider]  nicotine (NICODERM CQ - DOSED IN MG/24 HOURS) 21 mg/24hr patch NicoDerm patch 21 mg daily x1 week and 14 mg patch daily x3 weeks then 7 mg patch daily x3 weeks and stop Patient not taking: Reported on 01/02/2022 07/03/20   Angiulli, Mcarthur Rossetti, PA-C  OLANZapine (ZYPREXA) 10 MG tablet Take 10 mg by mouth at bedtime.    [provider]  pantoprazole (PROTONIX) 40 MG tablet Take 40 mg by mouth daily. 12/09/21   [provider]  QUEtiapine (SEROQUEL) 25 MG tablet Take 0.5 tablets (12.5 mg total) by mouth at bedtime. Patient taking differently: Take 25 mg by mouth at bedtime. 08/15/20   Ihor Austin, NP     Allergies:     Allergies  Allergen Reactions   Penicillins Rash    Rash per care everywhere     Physical Exam:   Vitals  Afebrile, RR 12, HR 68  Gen:-Sleepy on and off, wakes up to voice, no acute distress, not really  verbal HEENT:- [place].AT, No sclera icterus Nose--weaned off Oxygen on 01/03/22 Neck-Supple Neck,No JVD,.  Lungs-Fair air movement, no wheezing  CV- S1, S2 normal, regular  Abd-  +ve B.Sounds, Abd Soft, No tenderness,    Extremity/Skin:- No  edema, pedal pulses present  Psych-moderate  memory and cognitive deficits since his strokes over the summer Neuro-residual left-sided hemiparesis with mild Lt UE contracture which is not new, no additional new focal deficits, no tremors     Data Review:    CBC Recent Labs  Lab 01/01/22 1802 01/02/22 0259 01/03/22 0448 01/04/22 0221 01/05/22 0400 01/07/22 0524  WBC 19.3* 18.1* 14.9* 13.9* 12.7* 7.3  HGB 17.3* 14.7 11.9* 11.2* 10.6* 10.8*  HCT 52.2* 46.7 36.0* 34.7* 33.4* 33.1*  PLT 163 126* 89* 88* 83* 82*  MCV 93.2 95.9 92.8 93.0 95.4 93.5  MCH 30.9 30.2 30.7 30.0 30.3 30.5  MCHC 33.1 31.5 33.1 32.3 31.7 32.6  RDW 14.6 14.6 14.6 14.8 15.0 14.6  LYMPHSABS 0.7  --   --   --   --   --   MONOABS 1.4*  --   --   --   --   --   EOSABS 0.3  --   --   --   --   --   BASOSABS 0.0  --   --   --   --   --    ------------------------------------------------------------------------------------------------------------------  Chemistries  Recent Labs  Lab 01/01/22 1802 01/02/22 0259 01/03/22 0448 01/04/22 0221 01/05/22 0400 01/06/22 0839 01/07/22 0524  NA 147*   < > 149* 148* 147* 146* 144  K 5.0   < > 2.8* 3.3* 3.5 3.2* 3.1*  CL 114*   < > 122* 123* 121* 119* 117*  CO2 21*   < > 21* 20* 21* 22 23  GLUCOSE 216*   < > 101* 97 98 132* 124*  BUN 69*   < > 50* 39* 25* 18 13  CREATININE 1.98*   < > 1.14 0.90 0.66 0.60* 0.50*  CALCIUM 10.1   < > 8.5* 8.4* 8.1* 7.9* 7.7*  MG 2.6*  --   --  2.0  --   --   --    < > = values in this interval not displayed.   ------------------------------------------------------------------------------------------------------------------ estimated creatinine clearance is 90.7 mL/min (A) (by C-G formula based on  SCr of 0.5 mg/dL (L)). ------------------------------------------------------------------------------------------------------------------ ------------------------------------------------------------------------------------------------------------------    Component Value Date/Time   BNP 131.0 (H) 01/01/2022 1802   Urinalysis    Component Value Date/Time   COLORURINE YELLOW 01/03/2022 1345   APPEARANCEUR CLOUDY (A) 01/03/2022 1345   LABSPEC 1.020 01/03/2022 1345   PHURINE 5.0 01/03/2022 1345   GLUCOSEU NEGATIVE 01/03/2022 1345   HGBUR SMALL (A) 01/03/2022 1345   BILIRUBINUR NEGATIVE 01/03/2022 1345   KETONESUR NEGATIVE 01/03/2022 1345   PROTEINUR 30 (A) 01/03/2022 1345   NITRITE NEGATIVE 01/03/2022 1345   LEUKOCYTESUR LARGE (A) 01/03/2022 1345     Imaging Results:   DVT Prophylaxis -comfort care AM Labs Ordered, also please review Full Orders  Family Communication: Admission, patients condition and plan of care including tests being ordered have been discussed with the patient and his mother who indicate understanding and agree with the plan   Condition   --overall prognosis is grave  Shon Hale M.D on 01/09/2022 at 4:07 PM Go to [email or web address] -  for contact info  [facility] - Office  [phone]

## 2022-01-08 NOTE — Progress Notes (Signed)
Patient arrived to the floor from ICU. He is alert but will not respond to questions. Rectal tube intact. Condom cath secured to drainage bag.

## 2022-01-08 NOTE — Progress Notes (Signed)
PROGRESS NOTE     Frank Luna, is a 61 y.o. male, DOB - 1961/02/16, GNF:[id number]  Admit date - 01/01/2022   Admitting Physician Ejiroghene Wendall Stade, MD  Outpatient Primary MD for the patient is Graylin Shiver [facility]  LOS - 7  Chief Complaint  Patient presents with   Shortness of Breath        Brief Narrative:  61 y.o. male with medical history significant for diabetes mellitus, stroke, hypertension  Admitted on 01/01/2022 with sepsis secondary to sepsis secondary to multifocal pneumonia left more than right with acute hypoxic respiratory failure requiring up to 15 L of oxygen on nonrebreather bag initially as well as acute metabolic encephalopathy and AKI - 01/07/22 -Face-to-face conference with patient's mother at bedside....  Three-way conference with palliative care provider and patient's mother by phone -No significant improvement in overall mentation, too sleepy, not awake enough to attempt oral intake -She request transition to comfort care 01/15/2022 Appears comfortable -Awaiting hospice evaluation to see if he can go to residential hospice or may have to go back to [facility] with comfort care/hospice following    -Assessment and Plan: 1)Acute Respiratory Failure with Hypoxia  secondary to multifocal pneumonia left more than right with acute hypoxic respiratory failure requiring up to 15 L of oxygen on nonrebreather bag initially--- -CT chest without contrast with multifocal pneumonia mostly on the left side -Added Azithromycin on 01/02/22 -Treated with cefepime ,  -de-escalated to Rocephin on 01/04/2022 -Stopped Vancomycin on 01/03/22 -Treated with bronchodilators and mucolytics -Hypoxia has resolved --Oxygen Weaned off on 01/03/22 --Patient's mother request transition to comfort care as of 01/07/2022 -Antibiotics have been discontinued  2)Sepsis secondary to multifocal pneumonia left more than right with acute hypoxic respiratory failure  WBC 19.3  >>18.1>>14.9>>13.9>>12.7 PCT 0.24 >>0.32>>0.42 -Lactic Acid 5.4 >> 6.1  -Management as above #1 -Urine and blood cultures NGTD --Patient's mother requested transition to comfort care as of 01/07/2022  3) dysphagia--- speech eval appreciated patient unable to have any oral intake -N.p.o. status recommended -Family not keen on PEG tube --Patient's mother request transition to comfort care as of 01/07/2022  4)Dehydration/Hypernatremia------due to poor oral intake   --Patient is n.p.o. due to swallowing difficulties --Patient's mother request transition to comfort care as of 01/07/2022  5)AKI (acute kidney injury) (HCC) On admission Cr elevated at 1.98, BUN of 69.  -As per Care Everywhere creatinine was 1.0 on 10/24/2021 -Creatinine and renal function improved with hydration --Patient's mother request transition to comfort care as of 01/07/2022  6)Recurrent Strokes----MRI Brain from 09/27/21 showed Acute perforator infarct involving the left basal ganglia,  Evolving subacute infarct in the right corona radiata, Remote lacunar infarcts, chronic microvascular ischemic disease and cerebral atrophy -Patient had speech, swallowing, cognitive deficits as well as left-sided hemiparesis--- since July 2023 got worse in August 2023 despite rehab--he has been unable to make significant improvement -Per patient's mother patient has had moderate memory and cognitive deficits since his strokes over the summer -CT head at this time and neuro exam at this time failed to demonstrate significant concerns for new additional strokes --Patient's mother request transition to comfort care as of 01/07/2022  7)Acute metabolic encephalopathy--secondary to sepsis/AKI/hypoxia compounded with underlying strokes -Seroquel discontinued --Patient's mother request transition to comfort care as of 01/07/2022  8)Elevated troponin---Sepsis and hypoxia related demand ischemia  Mild troponin elevation -troponin 34 > 35.  EKG  with new diffuse T wave inversions in lead to 3 aVF V3 through V6.   --Patient's mother request  transition to comfort care as of 01/07/2022  9)DM2-  A1C is 6.5 reflecting good diabetic control PTA -Patient's mother request transition to comfort care as of 01/07/2022  10)HTN --Patient's mother request transition to comfort care as of 01/07/2022  11)Social/Ethics--- plan of care discussed with patient's mother who is at bedside -Patient is legally married but he has been separated from his wife since April 2023 -He lives with his mother since April 2023, suffered recurrent strokes in July and August 2023 and ended up being admitted to SNF facility for rehab since August 2023 -Patient wife Talbert ForestShirley defer decision making to patient's mom Ms. Rhunette CroftMildred -Patient has a stepdaughter Dois DavenportSandra -Patient remains a DNR/DNI -Given lack of neurological and neuromuscular improvement from strokes since July/August 2023, overall prognosis for full recovery from 2 weeks recurrent stroke related deficits Not very encouraging -Patient's mother Ms. Rhunette CroftMildred... Indicates that she does Not want a feeding tube -Patient's wife defers decision-making to patient's mother's--they have been separated since April 2023 01/07/22 -Face-to-face conference with patient's mother at bedside....  Three-way conference with palliative care provider and patient's mother by phone -No significant improvement in overall mentation, too sleepy, not awake enough to attempt oral intake --Patient's mother request transition to comfort care as of 01/07/2022 06-17-21 Appears comfortable -Awaiting hospice evaluation to see if he can go to residential hospice or may have [facility] with comfort care/hospice following  Disposition/Need for in-Hospital Stay- patient unable to be discharged at this time due to  -Patient's mother request transition to comfort care as of 01/07/2022 ---[facility] need [facility] with  comfort care/hospice versus transfer to residential hospice house  Status is: Inpatient   Disposition: The patient is from: SNF              Anticipated d/c is to: SNF Vs hospice              Anticipated d/c date is: 1 day Code Status :  -  Code Status: DNR   Family Communication:  Discussed with patient's mother who is at bedside DVT Prophylaxis  :   - SCDs     Lab Results  Component Value Date   PLT 82 (L) 01/07/2022   Inpatient Medications  Scheduled Meds:  Continuous Infusions:   PRN Meds:.acetaminophen **OR** acetaminophen, albuterol, antiseptic oral rinse, glycopyrrolate **OR** glycopyrrolate **OR** glycopyrrolate, haloperidol **OR** haloperidol **OR** haloperidol lactate, LORazepam **OR** LORazepam **OR** LORazepam, morphine CONCENTRATE, ondansetron **OR** ondansetron (ZOFRAN) IV, polyvinyl alcohol   Anti-infectives (From admission, onward)    Start     Dose/Rate Route Frequency Ordered Stop   01/04/22 1400  cefTRIAXone (ROCEPHIN) 2 g in sodium chloride 0.9 % 100 mL IVPB  Status:  Discontinued        2 g 200 mL/hr over 30 Minutes Intravenous Every 24 hours 01/04/22 1057 01/07/22 1407   01/03/22 1000  ceFEPIme (MAXIPIME) 2 g in sodium chloride 0.9 % 100 mL IVPB  Status:  Discontinued        2 g 200 mL/hr over 30 Minutes Intravenous Every 8 hours 01/03/22 0922 01/04/22 1057   01/03/22 0200  vancomycin (VANCOREADY) IVPB 750 mg/150 mL  Status:  Discontinued        750 mg 150 mL/hr over 60 Minutes Intravenous Every 24 hours 01/02/22 1136 01/03/22 1029   01/02/22 1130  azithromycin (ZITHROMAX) 500 mg in sodium chloride 0.9 % 250 mL IVPB  Status:  Discontinued        500  mg 250 mL/hr over 60 Minutes Intravenous Every 24 hours 01/02/22 1038 01/07/22 1407   01/02/22 1000  ceFEPIme (MAXIPIME) 2 g in sodium chloride 0.9 % 100 mL IVPB  Status:  Discontinued        2 g 200 mL/hr over 30 Minutes Intravenous Every 12 hours 01/02/22 0109 01/03/22 0922   01/02/22 0109  vancomycin  variable dose per unstable renal function (pharmacist dosing)  Status:  Discontinued         Does not apply See admin instructions 01/02/22 0109 01/02/22 1136   01/01/22 2330  ceFEPIme (MAXIPIME) 2 g in sodium chloride 0.9 % 100 mL IVPB        2 g 200 mL/hr over 30 Minutes Intravenous  Once 01/01/22 2320 01/02/22 0314   01/01/22 2315  vancomycin (VANCOREADY) IVPB 2000 mg/400 mL        2,000 mg 200 mL/hr over 120 Minutes Intravenous  Once 01/01/22 2306 01/02/22 0243   01/01/22 2245  metroNIDAZOLE (FLAGYL) IVPB 500 mg  Status:  Discontinued        500 mg 100 mL/hr over 60 Minutes Intravenous Every 12 hours 01/01/22 2235 01/02/22 1038       Subjective: Frank Luna today has no fevers, no emesis,  No chest pain,   - 2022-01-30 Appears comfortable -Awaiting hospice evaluation to see if he can go to residential hospice or may have to go back [facility] with comfort care/hospice following  Objective: Vitals:   01/07/22 1407 01/07/22 1614 01/30/22 0649 January 30, 2022 0800  BP:   (!) 144/83   Pulse:   82   Resp:   18 (!) 25  Temp:  (!) 97.1 F (36.2 C) 98.9 F (37.2 C)   TempSrc:  Axillary Oral   SpO2: 97%  93%   Weight:      Height:        Intake/Output Summary (Last 24 hours) at 01/30/22 1043 Last data filed at 01/07/2022 2029 Gross per 24 hour  Intake 1142.82 ml  Output 1085 ml  Net 57.82 ml   Filed Weights   01/05/22 0500 01/06/22 0400 01/07/22 0400  Weight: 67.1 kg 67.9 kg 69.4 kg   Physical Exam  Gen:-Sleepy on and off, wakes up to voice, no acute distress, not really verbal HEENT:- [place].AT, No sclera icterus Nose--weaned off Oxygen on 01/03/22 Neck-Supple Neck,No JVD,.  Lungs-Fair air movement, no wheezing  CV- S1, S2 normal, regular  Abd-  +ve B.Sounds, Abd Soft, No tenderness,    Extremity/Skin:- No  edema, pedal pulses present  Psych-moderate memory and cognitive deficits since his strokes over the summer Neuro-residual left-sided hemiparesis with mild  Lt UE contracture which is not new, no additional new focal deficits, no tremors  Data Reviewed: I have personally reviewed following labs and imaging studies  CBC: Recent Labs  Lab 01/01/22 1802 01/02/22 0259 01/03/22 0448 01/04/22 0221 01/05/22 0400 01/07/22 0524  WBC 19.3* 18.1* 14.9* 13.9* 12.7* 7.3  NEUTROABS 16.9*  --   --   --   --   --   HGB 17.3* 14.7 11.9* 11.2* 10.6* 10.8*  HCT 52.2* 46.7 36.0* 34.7* 33.4* 33.1*  MCV 93.2 95.9 92.8 93.0 95.4 93.5  PLT 163 126* 89* 88* 83* 82*   Basic Metabolic Panel: Recent Labs  Lab 01/01/22 1802 01/02/22 0259 01/02/22 1516 01/03/22 0448 01/04/22 0221 01/05/22 0400 01/06/22 0839 01/07/22 0524  NA 147*   < > 144 149* 148* 147* 146* 144  K 5.0   < >  3.2* 2.8* 3.3* 3.5 3.2* 3.1*  CL 114*   < > 118* 122* 123* 121* 119* 117*  CO2 21*   < > 19* 21* 20* 21* 22 23  GLUCOSE 216*   < > 128* 101* 97 98 132* 124*  BUN 69*   < > 62* 50* 39* 25* 18 13  CREATININE 1.98*   < > 1.53* 1.14 0.90 0.66 0.60* 0.50*  CALCIUM 10.1   < > 8.4* 8.5* 8.4* 8.1* 7.9* 7.7*  MG 2.6*  --   --   --  2.0  --   --   --   PHOS  --   --  2.7  --  1.6*  --   --  1.6*   < > = values in this interval not displayed.   GFR: Estimated Creatinine Clearance: 90.7 mL/min (A) (by C-G formula based on SCr of 0.5 mg/dL (L)). Recent Results (from the past 240 hour(s))  Resp Panel by RT-PCR (Flu A&B, Covid) Anterior Nasal Swab     Status: None   Collection Time: 01/01/22  6:09 PM   Specimen: Anterior Nasal Swab  Result Value Ref Range Status   SARS Coronavirus 2 by RT PCR NEGATIVE NEGATIVE Final    Comment: (NOTE) SARS-CoV-2 target nucleic acids are NOT DETECTED.  The SARS-CoV-2 RNA is generally detectable in upper respiratory specimens during the acute phase of infection. The lowest concentration of SARS-CoV-2 viral copies this assay can detect is 138 copies/mL. A negative result does not preclude SARS-Cov-2 infection and should not be used as the sole basis for  treatment or other patient management decisions. A negative result may occur with  improper specimen collection/handling, submission of specimen other than nasopharyngeal swab, presence of viral mutation(s) within the areas targeted by this assay, and inadequate number of viral copies(<138 copies/mL). A negative result must be combined with clinical observations, patient history, and epidemiological information. The expected result is Negative.  Fact Sheet for Patients:  BloggerCourse.com  Fact Sheet for Healthcare Providers:  SeriousBroker.it  This test is no t yet approved or cleared by the [place] FDA and  has been authorized for detection and/or diagnosis of SARS-CoV-2 by FDA under an Emergency Use Authorization (EUA). This EUA will remain  in effect (meaning this test can be used) for the duration of the COVID-19 declaration under Section 564(b)(1) of the Act, 21 U.S.C.section 360bbb-3(b)(1), unless the authorization is terminated  or revoked sooner.       Influenza A by PCR NEGATIVE NEGATIVE Final   Influenza B by PCR NEGATIVE NEGATIVE Final    Comment: (NOTE) The Xpert Xpress SARS-CoV-2/FLU/RSV plus assay is intended as an aid in the diagnosis of influenza from Nasopharyngeal swab specimens and should not be used as a sole basis for treatment. Nasal washings and aspirates are unacceptable for Xpert Xpress SARS-CoV-2/FLU/RSV testing.  Fact Sheet for Patients: BloggerCourse.com  Fact Sheet for Healthcare Providers: SeriousBroker.it  This test is not yet approved or cleared by the [place] FDA and has been authorized for detection and/or diagnosis of SARS-CoV-2 by FDA under an Emergency Use Authorization (EUA). This EUA will remain in effect (meaning this test can be used) for the duration of the COVID-19 declaration under Section 564(b)(1) of the Act, 21  U.S.C. section 360bbb-3(b)(1), unless the authorization is terminated or revoked.  Performed at [facility], [address] [place], [place] [address]   Culture, blood (Routine X 2) w Reflex to ID Panel     Status:  None   Collection Time: 01/01/22 10:29 PM   Specimen: Right Antecubital; Blood  Result Value Ref Range Status   Specimen Description RIGHT ANTECUBITAL  Final   Special Requests   Final    BOTTLES DRAWN AEROBIC AND ANAEROBIC Blood Culture adequate volume   Culture   Final    NO GROWTH 5 DAYS Performed at [facility], [address], [place], [place] [address]    Report Status 01/06/2022 FINAL  Final  Culture, blood (Routine X 2) w Reflex to ID Panel     Status: None   Collection Time: 01/01/22 10:29 PM   Specimen: BLOOD RIGHT HAND  Result Value Ref Range Status   Specimen Description BLOOD RIGHT HAND  Final   Special Requests   Final    BOTTLES DRAWN AEROBIC AND ANAEROBIC Blood Culture results may not be optimal due to an inadequate volume of blood received in culture bottles   Culture   Final    NO GROWTH 5 DAYS Performed at [facility], [address] [place], [place] [address]    Report Status 01/06/2022 FINAL  Final  MRSA Next Gen by PCR, Nasal     Status: None   Collection Time: 01/01/22 11:58 PM   Specimen: Nasal Mucosa; Nasal Swab  Result Value Ref Range Status   MRSA by PCR Next Gen NOT DETECTED NOT DETECTED Final    Comment: (NOTE) The GeneXpert MRSA Assay (FDA approved for NASAL specimens only), is one component of a comprehensive MRSA colonization surveillance program. It is not intended to diagnose MRSA infection nor to guide or monitor treatment for MRSA infections. Test performance is not FDA approved in patients less than 66 years old. Performed at [facility], [address], [place], [place] [address]   Culture, blood (Routine X 2) w Reflex to ID Panel     Status: None   Collection Time: 01/03/22 12:49 PM   Specimen: BLOOD  Result Value Ref  Range Status   Specimen Description BLOOD BLOOD LEFT ARM  Final   Special Requests   Final    BLOOD BOTTLES DRAWN AEROBIC AND ANAEROBIC Blood Culture adequate volume BLOOD RIGHT HAND   Culture   Final    NO GROWTH 5 DAYS Performed at [facility], [address] [place], [place] [address]    Report Status 12/28/2021 FINAL  Final  Culture, blood (Routine X 2) w Reflex to ID Panel     Status: None   Collection Time: 01/03/22 12:49 PM   Specimen: BLOOD  Result Value Ref Range Status   Specimen Description BLOOD  Final   Special Requests   Final    BLOOD BOTTLES DRAWN AEROBIC AND ANAEROBIC Blood Culture adequate volume BLOOD RIGHT HAND   Culture   Final    NO GROWTH 5 DAYS Performed at [facility], [address] [place], [place] [address]    Report Status 12/24/2021 FINAL  Final  Urine Culture     Status: None   Collection Time: 01/03/22  1:45 PM   Specimen: In/Out Cath Urine  Result Value Ref Range Status   Specimen Description   Final    IN/OUT CATH URINE Performed at [facility], [address], [place], [place] [address]    Special Requests   Final    NONE Performed at [facility], [address], [place], [place] [address]    Culture   Final    NO GROWTH Performed at [facility], [address] [place],  [place] [address]    Report Status 01/05/2022 FINAL  Final    Radiology Studies:  Scheduled Meds:  Continuous Infusions:    LOS: 7 days   Shon Hale M.D on 12/24/2021 at 10:43 AM  Go to [email or web address] - for contact info  [facility] - Office  [phone]  If 7PM-7AM, please contact night-coverage [email or web address] 01/10/2022, 10:43 AM

## 2022-01-09 DIAGNOSIS — I639 Cerebral infarction, unspecified: Secondary | ICD-10-CM

## 2022-01-09 DIAGNOSIS — Z515 Encounter for palliative care: Principal | ICD-10-CM

## 2022-01-09 MED ORDER — LORAZEPAM 2 MG/ML IJ SOLN
1.0000 mg | Freq: Three times a day (TID) | INTRAMUSCULAR | Status: DC
  Administered 2022-01-09 – 2022-01-12 (×11): 1 mg via INTRAVENOUS
  Filled 2022-01-09 (×11): qty 1

## 2022-01-09 MED ORDER — MORPHINE SULFATE (CONCENTRATE) 10 MG/0.5ML PO SOLN
2.6000 mg | ORAL | Status: DC | PRN
  Administered 2022-01-09: 2.6 mg via ORAL
  Administered 2022-01-09 – 2022-01-11 (×7): 5 mg via ORAL
  Filled 2022-01-09 (×9): qty 0.5

## 2022-01-09 MED ORDER — MORPHINE SULFATE (CONCENTRATE) 10 MG/0.5ML PO SOLN
2.6000 mg | ORAL | Status: DC
  Administered 2022-01-09 – 2022-01-10 (×6): 2.6 mg via ORAL
  Filled 2022-01-09 (×6): qty 0.5

## 2022-01-09 NOTE — Progress Notes (Signed)
Palliative: Mr. Vizcarrondo is now GIP status, inpatient hospice, with hospice of [facility].  He is lying quietly in bed.  He appears acutely/chronically ill and quite frail.  He does not interact with me in any meaningful way.  His eyes are open at times.  He clearly cannot make his basic needs known.  There is no family at bedside at this time.    Face-to-face conference with bedside nursing staff related to patient condition, needs, goals of care, symptom management.  Mr. Fetch received no meds for end-of-life care overnight.  Medication scheduled for end-of-life care.  Conference with attending, bedside nursing staff, transition of care team related to patient condition, needs, goals of care, disposition.  Plan: Full comfort care, GIP status with hospice of [facility]. Prognosis: Hours to days.  Anticipate in-hospital death.  35 minutes Lillia Carmel, NP Palliative medicine team Team phone [phone] Greater than 50% of this time was spent counseling and coordinating care related to the above assessment and plan.

## 2022-01-09 NOTE — Progress Notes (Signed)
PROGRESS NOTE    Frank Luna  YYT:[id number] DOB: 1960-11-24 DOA:  PCP: Graylin Shiver [place]    Brief Narrative:  61 year old gentleman with history of type 2 diabetes, stroke, hypertension, permanent neurological deficits brought to the emergency room with decreased oral intake and obtundation.  He was not improving, unable to keep up oral intake and now remains persistently encephalopathic so changed to comfort care and hospice. 11/22, waiting for inpatient hospice availability.   Assessment & Plan:   Acute respiratory failure with hypoxemia with multifocal pneumonia Sepsis secondary to multifocal pneumonia Dysphagia Failure to thrive Dehydration and hypernatremia Recurrently stroke Acute metabolic encephalopathy End-of-life  Patient remains in end-of-life and hospice status in the hospital.  Currently symptoms are controlled.  Since patient is persistently encephalopathic, currently not needing much medications. Transfer to inpatient hospice whenever bed available. Patient cannot keep any oral intake or oral medications, may leave IV line in place if desired transport today. Medicate for comfort before transferring. RN may pronounce death if happens in the hospital.   DVT prophylaxis: Comfort care   Code Status: Comfort care Family Communication: None at the bedside Disposition Plan: Inpatient hospice.     Consultants:  Palliative and hospice  Procedures:  None  Antimicrobials:  None   Subjective: Patient seen and examined.  Obtunded.  Objective: There were no vitals filed for this visit.  Intake/Output Summary (Last 24 hours) at 01/09/2022 0818 Last data filed at 01/09/2022 0536 Gross per 24 hour  Intake --  Output 500 ml  Net -500 ml   There were no vitals filed for this visit.  Examination:  Obtunded.    Data Reviewed: I have personally reviewed following labs and imaging studies  CBC: Recent Labs  Lab 01/03/22 0448  01/04/22 0221 01/05/22 0400 01/07/22 0524  WBC 14.9* 13.9* 12.7* 7.3  HGB 11.9* 11.2* 10.6* 10.8*  HCT 36.0* 34.7* 33.4* 33.1*  MCV 92.8 93.0 95.4 93.5  PLT 89* 88* 83* 82*   Basic Metabolic Panel: Recent Labs  Lab 01/02/22 1516 01/03/22 0448 01/04/22 0221 01/05/22 0400 01/06/22 0839 01/07/22 0524  NA 144 149* 148* 147* 146* 144  K 3.2* 2.8* 3.3* 3.5 3.2* 3.1*  CL 118* 122* 123* 121* 119* 117*  CO2 19* 21* 20* 21* 22 23  GLUCOSE 128* 101* 97 98 132* 124*  BUN 62* 50* 39* 25* 18 13  CREATININE 1.53* 1.14 0.90 0.66 0.60* 0.50*  CALCIUM 8.4* 8.5* 8.4* 8.1* 7.9* 7.7*  MG  --   --  2.0  --   --   --   PHOS 2.7  --  1.6*  --   --  1.6*   GFR: Estimated Creatinine Clearance: 90.7 mL/min (A) (by C-G formula based on SCr of 0.5 mg/dL (L)). Liver Function Tests: Recent Labs  Lab 01/02/22 1516 01/04/22 0221 01/07/22 0524  ALBUMIN 2.8* 2.5* 1.9*   No results for input(s): "LIPASE", "AMYLASE" in the last 168 hours. No results for input(s): "AMMONIA" in the last 168 hours. Coagulation Profile: No results for input(s): "INR", "PROTIME" in the last 168 hours. Cardiac Enzymes: No results for input(s): "CKTOTAL", "CKMB", "CKMBINDEX", "TROPONINI" in the last 168 hours. BNP (last 3 results) No results for input(s): "PROBNP" in the last 8760 hours. HbA1C: No results for input(s): "HGBA1C" in the last 72 hours. CBG: Recent Labs  Lab 01/06/22 2336 01/07/22 0358 01/07/22 0853 01/07/22 1205 01/07/22 1612  GLUCAP 126* 106* 121* 119* 102*   Lipid Profile: No results for input(s): "CHOL", "HDL", "LDLCALC", "  TRIG", "CHOLHDL", "LDLDIRECT" in the last 72 hours. Thyroid Function Tests: No results for input(s): "TSH", "T4TOTAL", "FREET4", "T3FREE", "THYROIDAB" in the last 72 hours. Anemia Panel: No results for input(s): "VITAMINB12", "FOLATE", "FERRITIN", "TIBC", "IRON", "RETICCTPCT" in the last 72 hours. Sepsis Labs: Recent Labs  Lab 01/03/22 0448  PROCALCITON 0.42    Recent  Results (from the past 240 hour(s))  Resp Panel by RT-PCR (Flu A&B, Covid) Anterior Nasal Swab     Status: None   Collection Time: 01/01/22  6:09 PM   Specimen: Anterior Nasal Swab  Result Value Ref Range Status   SARS Coronavirus 2 by RT PCR NEGATIVE NEGATIVE Final    Comment: (NOTE) SARS-CoV-2 target nucleic acids are NOT DETECTED.  The SARS-CoV-2 RNA is generally detectable in upper respiratory specimens during the acute phase of infection. The lowest concentration of SARS-CoV-2 viral copies this assay can detect is 138 copies/mL. A negative result does not preclude SARS-Cov-2 infection and should not be used as the sole basis for treatment or other patient management decisions. A negative result may occur with  improper specimen collection/handling, submission of specimen other than nasopharyngeal swab, presence of viral mutation(s) within the areas targeted by this assay, and inadequate number of viral copies(<138 copies/mL). A negative result must be combined with clinical observations, patient history, and epidemiological information. The expected result is Negative.  Fact Sheet for Patients:  BloggerCourse.com  Fact Sheet for Healthcare Providers:  SeriousBroker.it  This test is no t yet approved or cleared by the [place] FDA and  has been authorized for detection and/or diagnosis of SARS-CoV-2 by FDA under an Emergency Use Authorization (EUA). This EUA will remain  in effect (meaning this test can be used) for the duration of the COVID-19 declaration under Section 564(b)(1) of the Act, 21 U.S.C.section 360bbb-3(b)(1), unless the authorization is terminated  or revoked sooner.       Influenza A by PCR NEGATIVE NEGATIVE Final   Influenza B by PCR NEGATIVE NEGATIVE Final    Comment: (NOTE) The Xpert Xpress SARS-CoV-2/FLU/RSV plus assay is intended as an aid in the diagnosis of influenza from Nasopharyngeal swab  specimens and should not be used as a sole basis for treatment. Nasal washings and aspirates are unacceptable for Xpert Xpress SARS-CoV-2/FLU/RSV testing.  Fact Sheet for Patients: BloggerCourse.com  Fact Sheet for Healthcare Providers: SeriousBroker.it  This test is not yet approved or cleared by the [place] FDA and has been authorized for detection and/or diagnosis of SARS-CoV-2 by FDA under an Emergency Use Authorization (EUA). This EUA will remain in effect (meaning this test can be used) for the duration of the COVID-19 declaration under Section 564(b)(1) of the Act, 21 U.S.C. section 360bbb-3(b)(1), unless the authorization is terminated or revoked.  Performed at [facility], [address] [place], [place] [address]   Culture, blood (Routine X 2) w Reflex to ID Panel     Status: None   Collection Time: 01/01/22 10:29 PM   Specimen: Right Antecubital; Blood  Result Value Ref Range Status   Specimen Description RIGHT ANTECUBITAL  Final   Special Requests   Final    BOTTLES DRAWN AEROBIC AND ANAEROBIC Blood Culture adequate volume   Culture   Final    NO GROWTH 5 DAYS Performed at [facility], [address], [place], [place] [address]    Report Status 01/06/2022 FINAL  Final  Culture, blood (Routine X 2) w Reflex to ID Panel     Status: None   Collection  Time: 01/01/22 10:29 PM   Specimen: BLOOD RIGHT HAND  Result Value Ref Range Status   Specimen Description BLOOD RIGHT HAND  Final   Special Requests   Final    BOTTLES DRAWN AEROBIC AND ANAEROBIC Blood Culture results may not be optimal due to an inadequate volume of blood received in culture bottles   Culture   Final    NO GROWTH 5 DAYS Performed at [facility], [address] [place], [place] [address]    Report Status 01/06/2022 FINAL  Final  MRSA Next Gen by PCR, Nasal     Status: None   Collection Time: 01/01/22 11:58 PM   Specimen: Nasal Mucosa; Nasal  Swab  Result Value Ref Range Status   MRSA by PCR Next Gen NOT DETECTED NOT DETECTED Final    Comment: (NOTE) The GeneXpert MRSA Assay (FDA approved for NASAL specimens only), is one component of a comprehensive MRSA colonization surveillance program. It is not intended to diagnose MRSA infection nor to guide or monitor treatment for MRSA infections. Test performance is not FDA approved in patients less than 49 years old. Performed at [facility], [address], [place], [place] [address]   Culture, blood (Routine X 2) w Reflex to ID Panel     Status: None   Collection Time: 01/03/22 12:49 PM   Specimen: BLOOD  Result Value Ref Range Status   Specimen Description BLOOD BLOOD LEFT ARM  Final   Special Requests   Final    BLOOD BOTTLES DRAWN AEROBIC AND ANAEROBIC Blood Culture adequate volume BLOOD RIGHT HAND   Culture   Final    NO GROWTH 5 DAYS Performed at [facility], [address] [place], [place] [address]    Report Status 12/20/2021 FINAL  Final  Culture, blood (Routine X 2) w Reflex to ID Panel     Status: None   Collection Time: 01/03/22 12:49 PM   Specimen: BLOOD  Result Value Ref Range Status   Specimen Description BLOOD  Final   Special Requests   Final    BLOOD BOTTLES DRAWN AEROBIC AND ANAEROBIC Blood Culture adequate volume BLOOD RIGHT HAND   Culture   Final    NO GROWTH 5 DAYS Performed at [facility], [address], [place], [place] [address]    Report Status  FINAL  Final  Urine Culture     Status: None   Collection Time: 01/03/22  1:45 PM   Specimen: In/Out Cath Urine  Result Value Ref Range Status   Specimen Description   Final    IN/OUT CATH URINE Performed at [facility], [address], [place], [place] [address]    Special Requests   Final    NONE Performed at [facility], [address], [place], [place] [address]    Culture   Final    NO GROWTH Performed at [facility], [address] [place], [place] [address]    Report  Status 01/05/2022 FINAL  Final         Radiology Studies: No results found.      Scheduled Meds:  sodium chloride flush  3 mL Intravenous Q12H   Continuous Infusions:  sodium chloride     chlorproMAZINE (THORAZINE) 12.5 mg in sodium chloride 0.9 % 25 mL IVPB       LOS: 1 day    Time spent: 35 minutes    Dorcas Carrow, MD [facility] Pager [phone]

## 2022-01-10 MED ORDER — MORPHINE SULFATE (CONCENTRATE) 10 MG/0.5ML PO SOLN
5.0000 mg | ORAL | Status: DC
  Administered 2022-01-10 – 2022-01-12 (×13): 5 mg via ORAL
  Filled 2022-01-10 (×12): qty 0.5

## 2022-01-10 NOTE — Progress Notes (Addendum)
Palliative: Frank Luna is full comfort care.  He is resting comfortably in bed.  He does not respond in any meaningful way to voice or touch.  He clearly cannot make his needs known.  There is no family at bedside at this time.  Face-to-face conference with bedside nursing staff and transition of care team related to patient condition, needs, symptom management.  Conference with attending, bedside nursing staff, transition of care team related to patient condition, needs, goals of care, disposition.  Addendum: Increased scheduled morphine from 2.6 mg to 5 mg p.o./SL every 4 hours.  Plan: Full comfort care.  Comfort and dignity at end-of-life, GIP status with [facility]. End-of-life order set in place.  DNR/goldenrod form on chart. Prognosis: Hours to days.  Anticipate in-hospital death.  35 minutes Lillia Carmel, NP Palliative medicine team Team phone [phone] Greater than 50% of this time was spent counseling and coordinating care related to the above assessment and plan.

## 2022-01-10 NOTE — Progress Notes (Signed)
PROGRESS NOTE    Frank Luna  ONG:[id number] DOB: 02-12-1961 DOA: 12/21/2021 PCP: Graylin Shiver [place]    Brief Narrative:  61 year old gentleman with history of type 2 diabetes, stroke, hypertension, permanent neurological deficits brought to the emergency room with decreased oral intake and obtundation.  He was not improving, unable to keep up oral intake and now remains persistently encephalopathic so changed to comfort care and hospice. 11/22, waiting for inpatient hospice availability. Patient is provided end-of-life care with the help of palliative care team pending inpatient hospice transfer.   Assessment & Plan:   Acute respiratory failure with hypoxemia with multifocal pneumonia Sepsis secondary to multifocal pneumonia Dysphagia Failure to thrive Dehydration and hypernatremia Recurrently stroke Acute metabolic encephalopathy End-of-life  Patient remains in end-of-life and hospice status in the hospital.  Currently symptoms are controlled.  Patient is currently on Ativan 1 mg every 8 hours, morphine solution every 4 hours scheduled doses with good symptom control over last 24 hours. Transfer to inpatient hospice whenever bed available. Patient cannot keep any oral intake or oral medications, may leave IV line in place if able transport today. Medicate for comfort before transferring. RN may pronounce death if happens in the hospital.   DVT prophylaxis: Comfort care   Code Status: Comfort care Family Communication: None at the bedside Disposition Plan: Inpatient hospice.     Consultants:  Palliative and hospice  Procedures:  None  Antimicrobials:  None   Subjective: Patient seen and examined.  He was mostly sleepy.  Last dose of Ativan and morphine was at 4:00.  He just moaned on strong stimulation but did not respond.  Objective: Vitals:   01/09/22 1328  BP: 136/82  Pulse: 93  Resp: 20  Temp: 98.9 F (37.2 C)  TempSrc: Oral  SpO2: 94%     Intake/Output Summary (Last 24 hours) at 01/10/2022 0736 Last data filed at 01/10/2022 0544 Gross per 24 hour  Intake 240 ml  Output 900 ml  Net -660 ml    There were no vitals filed for this visit.  Examination:  Obtunded.  Looks comfortable.  Dry mucous membranes.  Moans on strong stimulation.    Data Reviewed: I have personally reviewed following labs and imaging studies  CBC: Recent Labs  Lab 01/04/22 0221 01/05/22 0400 01/07/22 0524  WBC 13.9* 12.7* 7.3  HGB 11.2* 10.6* 10.8*  HCT 34.7* 33.4* 33.1*  MCV 93.0 95.4 93.5  PLT 88* 83* 82*    Basic Metabolic Panel: Recent Labs  Lab 01/04/22 0221 01/05/22 0400 01/06/22 0839 01/07/22 0524  NA 148* 147* 146* 144  K 3.3* 3.5 3.2* 3.1*  CL 123* 121* 119* 117*  CO2 20* 21* 22 23  GLUCOSE 97 98 132* 124*  BUN 39* 25* 18 13  CREATININE 0.90 0.66 0.60* 0.50*  CALCIUM 8.4* 8.1* 7.9* 7.7*  MG 2.0  --   --   --   PHOS 1.6*  --   --  1.6*    GFR: Estimated Creatinine Clearance: 90.7 mL/min (A) (by C-G formula based on SCr of 0.5 mg/dL (L)). Liver Function Tests: Recent Labs  Lab 01/04/22 0221 01/07/22 0524  ALBUMIN 2.5* 1.9*    No results for input(s): "LIPASE", "AMYLASE" in the last 168 hours. No results for input(s): "AMMONIA" in the last 168 hours. Coagulation Profile: No results for input(s): "INR", "PROTIME" in the last 168 hours. Cardiac Enzymes: No results for input(s): "CKTOTAL", "CKMB", "CKMBINDEX", "TROPONINI" in the last 168 hours. BNP (last 3 results) No results  for input(s): "PROBNP" in the last 8760 hours. HbA1C: No results for input(s): "HGBA1C" in the last 72 hours. CBG: Recent Labs  Lab 01/06/22 2336 01/07/22 0358 01/07/22 0853 01/07/22 1205 01/07/22 1612  GLUCAP 126* 106* 121* 119* 102*    Lipid Profile: No results for input(s): "CHOL", "HDL", "LDLCALC", "TRIG", "CHOLHDL", "LDLDIRECT" in the last 72 hours. Thyroid Function Tests: No results for input(s): "TSH", "T4TOTAL",  "FREET4", "T3FREE", "THYROIDAB" in the last 72 hours. Anemia Panel: No results for input(s): "VITAMINB12", "FOLATE", "FERRITIN", "TIBC", "IRON", "RETICCTPCT" in the last 72 hours. Sepsis Labs: No results for input(s): "PROCALCITON", "LATICACIDVEN" in the last 168 hours.   Recent Results (from the past 240 hour(s))  Resp Panel by RT-PCR (Flu A&B, Covid) Anterior Nasal Swab     Status: None   Collection Time: 01/01/22  6:09 PM   Specimen: Anterior Nasal Swab  Result Value Ref Range Status   SARS Coronavirus 2 by RT PCR NEGATIVE NEGATIVE Final    Comment: (NOTE) SARS-CoV-2 target nucleic acids are NOT DETECTED.  The SARS-CoV-2 RNA is generally detectable in upper respiratory specimens during the acute phase of infection. The lowest concentration of SARS-CoV-2 viral copies this assay can detect is 138 copies/mL. A negative result does not preclude SARS-Cov-2 infection and should not be used as the sole basis for treatment or other patient management decisions. A negative result may occur with  improper specimen collection/handling, submission of specimen other than nasopharyngeal swab, presence of viral mutation(s) within the areas targeted by this assay, and inadequate number of viral copies(<138 copies/mL). A negative result must be combined with clinical observations, patient history, and epidemiological information. The expected result is Negative.  Fact Sheet for Patients:  BloggerCourse.com  Fact Sheet for Healthcare Providers:  SeriousBroker.it  This test is no t yet approved or cleared by the [place] FDA and  has been authorized for detection and/or diagnosis of SARS-CoV-2 by FDA under an Emergency Use Authorization (EUA). This EUA will remain  in effect (meaning this test can be used) for the duration of the COVID-19 declaration under Section 564(b)(1) of the Act, 21 U.S.C.section 360bbb-3(b)(1), unless the  authorization is terminated  or revoked sooner.       Influenza A by PCR NEGATIVE NEGATIVE Final   Influenza B by PCR NEGATIVE NEGATIVE Final    Comment: (NOTE) The Xpert Xpress SARS-CoV-2/FLU/RSV plus assay is intended as an aid in the diagnosis of influenza from Nasopharyngeal swab specimens and should not be used as a sole basis for treatment. Nasal washings and aspirates are unacceptable for Xpert Xpress SARS-CoV-2/FLU/RSV testing.  Fact Sheet for Patients: BloggerCourse.com  Fact Sheet for Healthcare Providers: SeriousBroker.it  This test is not yet approved or cleared by the [place] FDA and has been authorized for detection and/or diagnosis of SARS-CoV-2 by FDA under an Emergency Use Authorization (EUA). This EUA will remain in effect (meaning this test can be used) for the duration of the COVID-19 declaration under Section 564(b)(1) of the Act, 21 U.S.C. section 360bbb-3(b)(1), unless the authorization is terminated or revoked.  Performed at [facility], [address] [place], [place] [address]   Culture, blood (Routine X 2) w Reflex to ID Panel     Status: None   Collection Time: 01/01/22 10:29 PM   Specimen: Right Antecubital; Blood  Result Value Ref Range Status   Specimen Description RIGHT ANTECUBITAL  Final   Special Requests   Final    BOTTLES DRAWN AEROBIC AND ANAEROBIC Blood Culture adequate  volume   Culture   Final    NO GROWTH 5 DAYS Performed at [facility], [address] [place], [place] [address]    Report Status 01/06/2022 FINAL  Final  Culture, blood (Routine X 2) w Reflex to ID Panel     Status: None   Collection Time: 01/01/22 10:29 PM   Specimen: BLOOD RIGHT HAND  Result Value Ref Range Status   Specimen Description BLOOD RIGHT HAND  Final   Special Requests   Final    BOTTLES DRAWN AEROBIC AND ANAEROBIC Blood Culture results may not be optimal due to an inadequate volume of blood  received in culture bottles   Culture   Final    NO GROWTH 5 DAYS Performed at [facility], [address] [place], [place] [address]    Report Status 01/06/2022 FINAL  Final  MRSA Next Gen by PCR, Nasal     Status: None   Collection Time: 01/01/22 11:58 PM   Specimen: Nasal Mucosa; Nasal Swab  Result Value Ref Range Status   MRSA by PCR Next Gen NOT DETECTED NOT DETECTED Final    Comment: (NOTE) The GeneXpert MRSA Assay (FDA approved for NASAL specimens only), is one component of a comprehensive MRSA colonization surveillance program. It is not intended to diagnose MRSA infection nor to guide or monitor treatment for MRSA infections. Test performance is not FDA approved in patients less than 61 years old. Performed at [facility], [address] [place], [place] [address]   Culture, blood (Routine X 2) w Reflex to ID Panel     Status: None   Collection Time: 01/03/22 12:49 PM   Specimen: BLOOD  Result Value Ref Range Status   Specimen Description BLOOD BLOOD LEFT ARM  Final   Special Requests   Final    BLOOD BOTTLES DRAWN AEROBIC AND ANAEROBIC Blood Culture adequate volume BLOOD RIGHT HAND   Culture   Final    NO GROWTH 5 DAYS Performed at [facility], [address] [place], [place] [address]    Report Status 01/17/2022 FINAL  Final  Culture, blood (Routine X 2) w Reflex to ID Panel     Status: None   Collection Time: 01/03/22 12:49 PM   Specimen: BLOOD  Result Value Ref Range Status   Specimen Description BLOOD  Final   Special Requests   Final    BLOOD BOTTLES DRAWN AEROBIC AND ANAEROBIC Blood Culture adequate volume BLOOD RIGHT HAND   Culture   Final    NO GROWTH 5 DAYS Performed at [facility], [address] [place], [place] [address]    Report Status 12/25/2021 FINAL  Final  Urine Culture     Status: None   Collection Time: 01/03/22  1:45 PM   Specimen: In/Out Cath Urine  Result Value Ref Range Status   Specimen Description   Final    IN/OUT CATH  URINE Performed at [facility], [address] [place], [place] [address]    Special Requests   Final    NONE Performed at [facility], [address] [place], [place] [address]    Culture   Final    NO GROWTH Performed at [facility], [address] [place], [place] [address]    Report Status 01/05/2022 FINAL  Final         Radiology Studies: No results found.      Scheduled Meds:  LORazepam  1 mg Intravenous Q8H   morphine CONCENTRATE  2.6 mg  Oral Q4H   sodium chloride flush  3 mL Intravenous Q12H   Continuous Infusions:  sodium chloride     chlorproMAZINE (THORAZINE) 12.5 mg in sodium chloride 0.9 % 25 mL IVPB       LOS: 2 days    Time spent: 35 minutes    Dorcas Carrow, MD [facility] Pager [phone]

## 2022-01-11 NOTE — Progress Notes (Signed)
PROGRESS NOTE    Frank Luna  [id number]:[id number] DOB: 04/23/1960 DOA: April 12, 2021 PCP: [name], Yessica Monona    Brief Narrative:  61 year old gentleman with history of type 2 diabetes, stroke, hypertension, permanent neurological deficits brought to the emergency room with decreased oral intake and obtundation.  He was not improving, unable to keep up oral intake and now remains persistently encephalopathic so changed to comfort care and hospice. 11/22, waiting for inpatient hospice availability. Patient is provided end-of-life care with the help of palliative care team pending inpatient hospice transfer.  Assessment & Plan:   Acute respiratory failure with hypoxemia with multifocal pneumonia Sepsis secondary to multifocal pneumonia Dysphagia Failure to thrive Dehydration and hypernatremia Recurrently stroke Acute metabolic encephalopathy End-of-life  Patient remains in end-of-life and hospice status in the hospital.  Currently symptoms are controlled.  Continue Ativan 1 mg every 8 hours, morphine solution every 4 hours scheduled doses with good symptom control over last 24 hours. Transfer to inpatient hospice whenever bed available. Patient cannot keep any oral intake or oral medications, may leave IV line in place if able transport today. Medicate for comfort before transferring. RN may pronounce death if happens in the hospital.   DVT prophylaxis: Comfort care   Code Status: Comfort care Family Communication: None at the bedside Disposition Plan: Inpatient hospice.     Consultants:  Palliative and hospice  Procedures:  None  Antimicrobials:  None   Subjective: Pt appears comfortable. NAD.   Objective: Vitals:   01/09/22 1328 01/10/22 2117  BP: 136/82 138/79  Pulse: 93 (!) 125  Resp: 20 (!) 44  Temp: 98.9 F (37.2 C) (!) 102 F (38.9 C)  TempSrc: Oral Axillary  SpO2: 94% (!) 83%    Intake/Output Summary (Last 24 hours) at 01/11/2022 1629 Last data  filed at 01/11/2022 1300 Gross per 24 hour  Intake 0 ml  Output 400 ml  Net -400 ml   There were no vitals filed for this visit.  Examination:  Obtunded.  Looks comfortable.  Dry mucous membranes.  Moans on strong stimulation. Pt having loud breathing and copious secretions. Moderately tachypneic.     Data Reviewed: I have personally reviewed following labs and imaging studies  CBC: Recent Labs  Lab 01/05/22 0400 01/07/22 0524  WBC 12.7* 7.3  HGB 10.6* 10.8*  HCT 33.4* 33.1*  MCV 95.4 93.5  PLT 83* 82*   Basic Metabolic Panel: Recent Labs  Lab 01/05/22 0400 01/06/22 0839 01/07/22 0524  NA 147* 146* 144  K 3.5 3.2* 3.1*  CL 121* 119* 117*  CO2 21* 22 23  GLUCOSE 98 132* 124*  BUN 25* 18 13  CREATININE 0.66 0.60* 0.50*  CALCIUM 8.1* 7.9* 7.7*  PHOS  --   --  1.6*   GFR: Estimated Creatinine Clearance: 90.7 mL/min (A) (by C-G formula based on SCr of 0.5 mg/dL (L)). Liver Function Tests: Recent Labs  Lab 01/07/22 0524  ALBUMIN 1.9*   No results for input(s): "LIPASE", "AMYLASE" in the last 168 hours. No results for input(s): "AMMONIA" in the last 168 hours. Coagulation Profile: No results for input(s): "INR", "PROTIME" in the last 168 hours. Cardiac Enzymes: No results for input(s): "CKTOTAL", "CKMB", "CKMBINDEX", "TROPONINI" in the last 168 hours. BNP (last 3 results) No results for input(s): "PROBNP" in the last 8760 hours. HbA1C: No results for input(s): "HGBA1C" in the last 72 hours. CBG: Recent Labs  Lab 01/06/22 2336 01/07/22 0358 01/07/22 0853 01/07/22 1205 01/07/22 1612  GLUCAP 126* 106* 121* 119* 102*  Lipid Profile: No results for input(s): "CHOL", "HDL", "LDLCALC", "TRIG", "CHOLHDL", "LDLDIRECT" in the last 72 hours. Thyroid Function Tests: No results for input(s): "TSH", "T4TOTAL", "FREET4", "T3FREE", "THYROIDAB" in the last 72 hours. Anemia Panel: No results for input(s): "VITAMINB12", "FOLATE", "FERRITIN", "TIBC", "IRON",  "RETICCTPCT" in the last 72 hours. Sepsis Labs: No results for input(s): "PROCALCITON", "LATICACIDVEN" in the last 168 hours.   Recent Results (from the past 240 hour(s))  Resp Panel by RT-PCR (Flu A&B, Covid) Anterior Nasal Swab     Status: None   Collection Time: 01/01/22  6:09 PM   Specimen: Anterior Nasal Swab  Result Value Ref Range Status   SARS Coronavirus 2 by RT PCR NEGATIVE NEGATIVE Final    Comment: (NOTE) SARS-CoV-2 target nucleic acids are NOT DETECTED.  The SARS-CoV-2 RNA is generally detectable in upper respiratory specimens during the acute phase of infection. The lowest concentration of SARS-CoV-2 viral copies this assay can detect is 138 copies/mL. A negative result does not preclude SARS-Cov-2 infection and should not be used as the sole basis for treatment or other patient management decisions. A negative result may occur with  improper specimen collection/handling, submission of specimen other than nasopharyngeal swab, presence of viral mutation(s) within the areas targeted by this assay, and inadequate number of viral copies(<138 copies/mL). A negative result must be combined with clinical observations, patient history, and epidemiological information. The expected result is Negative.  Fact Sheet for Patients:  BloggerCourse.com  Fact Sheet for Healthcare Providers:  SeriousBroker.it  This test is no t yet approved or cleared by the [place] FDA and  has been authorized for detection and/or diagnosis of SARS-CoV-2 by FDA under an Emergency Use Authorization (EUA). This EUA will remain  in effect (meaning this test can be used) for the duration of the COVID-19 declaration under Section 564(b)(1) of the Act, 21 U.S.C.section 360bbb-3(b)(1), unless the authorization is terminated  or revoked sooner.       Influenza A by PCR NEGATIVE NEGATIVE Final   Influenza B by PCR NEGATIVE NEGATIVE Final     Comment: (NOTE) The Xpert Xpress SARS-CoV-2/FLU/RSV plus assay is intended as an aid in the diagnosis of influenza from Nasopharyngeal swab specimens and should not be used as a sole basis for treatment. Nasal washings and aspirates are unacceptable for Xpert Xpress SARS-CoV-2/FLU/RSV testing.  Fact Sheet for Patients: BloggerCourse.com  Fact Sheet for Healthcare Providers: SeriousBroker.it  This test is not yet approved or cleared by the [place] FDA and has been authorized for detection and/or diagnosis of SARS-CoV-2 by FDA under an Emergency Use Authorization (EUA). This EUA will remain in effect (meaning this test can be used) for the duration of the COVID-19 declaration under Section 564(b)(1) of the Act, 21 U.S.C. section 360bbb-3(b)(1), unless the authorization is terminated or revoked.  Performed at [facility], [address], [place], [place] [address]   Culture, blood (Routine X 2) w Reflex to ID Panel     Status: None   Collection Time: 01/01/22 10:29 PM   Specimen: Right Antecubital; Blood  Result Value Ref Range Status   Specimen Description RIGHT ANTECUBITAL  Final   Special Requests   Final    BOTTLES DRAWN AEROBIC AND ANAEROBIC Blood Culture adequate volume   Culture   Final    NO GROWTH 5 DAYS Performed at [facility], [address], [place], [place] [address]    Report Status 01/06/2022 FINAL  Final  Culture, blood (Routine X 2) w Reflex to ID  Panel     Status: None   Collection Time: 01/01/22 10:29 PM   Specimen: BLOOD RIGHT HAND  Result Value Ref Range Status   Specimen Description BLOOD RIGHT HAND  Final   Special Requests   Final    BOTTLES DRAWN AEROBIC AND ANAEROBIC Blood Culture results may not be optimal due to an inadequate volume of blood received in culture bottles   Culture   Final    NO GROWTH 5 DAYS Performed at [facility], [address] [place], [place] [address]    Report Status  01/06/2022 FINAL  Final  MRSA Next Gen by PCR, Nasal     Status: None   Collection Time: 01/01/22 11:58 PM   Specimen: Nasal Mucosa; Nasal Swab  Result Value Ref Range Status   MRSA by PCR Next Gen NOT DETECTED NOT DETECTED Final    Comment: (NOTE) The GeneXpert MRSA Assay (FDA approved for NASAL specimens only), is one component of a comprehensive MRSA colonization surveillance program. It is not intended to diagnose MRSA infection nor to guide or monitor treatment for MRSA infections. Test performance is not FDA approved in patients less than 14 years old. Performed at [facility], [address] [place], [place] [address]   Culture, blood (Routine X 2) w Reflex to ID Panel     Status: None   Collection Time: 01/03/22 12:49 PM   Specimen: BLOOD  Result Value Ref Range Status   Specimen Description BLOOD BLOOD LEFT ARM  Final   Special Requests   Final    BLOOD BOTTLES DRAWN AEROBIC AND ANAEROBIC Blood Culture adequate volume BLOOD RIGHT HAND   Culture   Final    NO GROWTH 5 DAYS Performed at [facility], [address] [place], [place] [address]    Report Status 01/14/2022 FINAL  Final  Culture, blood (Routine X 2) w Reflex to ID Panel     Status: None   Collection Time: 01/03/22 12:49 PM   Specimen: BLOOD  Result Value Ref Range Status   Specimen Description BLOOD  Final   Special Requests   Final    BLOOD BOTTLES DRAWN AEROBIC AND ANAEROBIC Blood Culture adequate volume BLOOD RIGHT HAND   Culture   Final    NO GROWTH 5 DAYS Performed at [facility], [address], [place], [place] [address]    Report Status 12/24/2021 FINAL  Final  Urine Culture     Status: None   Collection Time: 01/03/22  1:45 PM   Specimen: In/Out Cath Urine  Result Value Ref Range Status   Specimen Description   Final    IN/OUT CATH URINE Performed at [facility], [address], [place], [place] [address]    Special Requests   Final    NONE Performed at [facility], [address]  [place], [place] [address]    Culture   Final    NO GROWTH Performed at [facility], [address], [place], [place] [address]    Report Status 01/05/2022 FINAL  Final     Radiology Studies: No results found.  Scheduled Meds:  LORazepam  1 mg Intravenous Q8H   morphine CONCENTRATE  5 mg Oral Q4H   sodium chloride flush  3 mL Intravenous Q12H   Continuous Infusions:  sodium chloride     chlorproMAZINE (THORAZINE) 12.5 mg in sodium chloride 0.9 % 25 mL IVPB       LOS: 3 days    Time spent: 35 minutes  Standley Dakins, MD How to contact the [facility] Attending or Consulting provider 7A - 7P or covering provider during after hours 7P -7A, for this patient?  Check the care team in [facility] and look for a) attending/consulting TRH provider listed and b) the [facility] team listed Log into [email or web address] and use [place]'s universal password to access. If you do not have the password, please contact the hospital operator. Locate the [facility] provider you are looking for under [facility] and page to a number that you can be directly reached. If you still have difficulty reaching the provider, please page the [facility] (Director on Call) for the Hospitalists listed on amion for assistance.

## 2022-01-11 NOTE — Progress Notes (Signed)
Patient is on comfort care. Patient was given prn medications during shift.  I suctioned patient's oral cavity with yanker x2 to remove secretions. Patient's mother called at [phone] to inquire of his condition.

## 2022-01-12 DIAGNOSIS — R627 Adult failure to thrive: Secondary | ICD-10-CM

## 2022-01-12 DIAGNOSIS — E1165 Type 2 diabetes mellitus with hyperglycemia: Secondary | ICD-10-CM

## 2022-01-12 DIAGNOSIS — G9341 Metabolic encephalopathy: Secondary | ICD-10-CM

## 2022-01-12 MED ORDER — MORPHINE SULFATE (CONCENTRATE) 10 MG/0.5ML PO SOLN
8.0000 mg | ORAL | Status: DC
  Administered 2022-01-12 (×2): 8 mg via ORAL
  Filled 2022-01-12 (×2): qty 0.5

## 2022-01-12 NOTE — Progress Notes (Signed)
Patient is comfort care. Family at bedside.

## 2022-01-12 NOTE — Progress Notes (Signed)
PROGRESS NOTE    Frank Luna  ZOX:[id number] DOB: 19-Jan-1961 DOA: January 12, 2022 PCP: Graylin Shiver [place]    Brief Narrative:  61 year old gentleman with history of type 2 diabetes, stroke, hypertension, permanent neurological deficits brought to the emergency room with decreased oral intake and obtundation.  He was not improving, unable to keep up oral intake and now remains persistently encephalopathic so changed to comfort care and hospice. 11/22, waiting for inpatient hospice availability. Patient is provided end-of-life care with the help of palliative care team pending inpatient hospice transfer.  Assessment & Plan:   Acute respiratory failure with hypoxemia with multifocal pneumonia Sepsis secondary to multifocal pneumonia Dysphagia Failure to thrive Dehydration and hypernatremia Recurrently stroke Acute metabolic encephalopathy End-of-life  Patient remains in end-of-life and hospice status in the hospital.  Currently symptoms are controlled.  Continue Ativan 1 mg every 8 hours, morphine solution every 4 hours scheduled doses with good symptom control over last 24 hours. Transfer to inpatient hospice whenever bed available. Patient cannot keep any oral intake or oral medications, may leave IV line in place if able transport today. Medicate for comfort before transferring. RN may pronounce death if happens in the hospital.   DVT prophylaxis: Comfort care   Code Status: Comfort care Family Communication: None at the bedside Disposition Plan: Inpatient hospice.     Consultants:  Palliative and hospice  Procedures:  None  Antimicrobials:  None   Subjective: Pt appears terminally ill.    Objective: Vitals:   01/09/22 1328 01/10/22 2117 12/23/2021 0407  BP: 136/82 138/79 (!) 83/52  Pulse: 93 (!) 125 (!) 53  Resp: 20 (!) 44 (!) 24  Temp: 98.9 F (37.2 C) (!) 102 F (38.9 C) (!) 102.9 F (39.4 C)  TempSrc: Oral Axillary Axillary  SpO2: 94% (!) 83% (!)  85%    Intake/Output Summary (Last 24 hours) at 12/27/2021 8119 Last data filed at 01/11/2022 1700 Gross per 24 hour  Intake 0 ml  Output --  Net 0 ml   There were no vitals filed for this visit.  Examination:  Obtunded.  Appears terminally ill.  NAD.    Pt seems to be more comfortable today.   Multiple family members at bedside.      Data Reviewed: I have personally reviewed following labs and imaging studies  CBC: Recent Labs  Lab 01/07/22 0524  WBC 7.3  HGB 10.8*  HCT 33.1*  MCV 93.5  PLT 82*   Basic Metabolic Panel: Recent Labs  Lab 01/06/22 0839 01/07/22 0524  NA 146* 144  K 3.2* 3.1*  CL 119* 117*  CO2 22 23  GLUCOSE 132* 124*  BUN 18 13  CREATININE 0.60* 0.50*  CALCIUM 7.9* 7.7*  PHOS  --  1.6*   GFR: Estimated Creatinine Clearance: 90.7 mL/min (A) (by C-G formula based on SCr of 0.5 mg/dL (L)). Liver Function Tests: Recent Labs  Lab 01/07/22 0524  ALBUMIN 1.9*   No results for input(s): "LIPASE", "AMYLASE" in the last 168 hours. No results for input(s): "AMMONIA" in the last 168 hours. Coagulation Profile: No results for input(s): "INR", "PROTIME" in the last 168 hours. Cardiac Enzymes: No results for input(s): "CKTOTAL", "CKMB", "CKMBINDEX", "TROPONINI" in the last 168 hours. BNP (last 3 results) No results for input(s): "PROBNP" in the last 8760 hours. HbA1C: No results for input(s): "HGBA1C" in the last 72 hours. CBG: Recent Labs  Lab 01/06/22 2336 01/07/22 0358 01/07/22 0853 01/07/22 1205 01/07/22 1612  GLUCAP 126* 106* 121* 119* 102*  Lipid Profile: No results for input(s): "CHOL", "HDL", "LDLCALC", "TRIG", "CHOLHDL", "LDLDIRECT" in the last 72 hours. Thyroid Function Tests: No results for input(s): "TSH", "T4TOTAL", "FREET4", "T3FREE", "THYROIDAB" in the last 72 hours. Anemia Panel: No results for input(s): "VITAMINB12", "FOLATE", "FERRITIN", "TIBC", "IRON", "RETICCTPCT" in the last 72 hours. Sepsis Labs: No results for  input(s): "PROCALCITON", "LATICACIDVEN" in the last 168 hours.   Recent Results (from the past 240 hour(s))  Culture, blood (Routine X 2) w Reflex to ID Panel     Status: None   Collection Time: 01/03/22 12:49 PM   Specimen: BLOOD  Result Value Ref Range Status   Specimen Description BLOOD BLOOD LEFT ARM  Final   Special Requests   Final    BLOOD BOTTLES DRAWN AEROBIC AND ANAEROBIC Blood Culture adequate volume BLOOD RIGHT HAND   Culture   Final    NO GROWTH 5 DAYS Performed at [facility], [address], [place], [place] [address]    Report Status 01/07/2022 FINAL  Final  Culture, blood (Routine X 2) w Reflex to ID Panel     Status: None   Collection Time: 01/03/22 12:49 PM   Specimen: BLOOD  Result Value Ref Range Status   Specimen Description BLOOD  Final   Special Requests   Final    BLOOD BOTTLES DRAWN AEROBIC AND ANAEROBIC Blood Culture adequate volume BLOOD RIGHT HAND   Culture   Final    NO GROWTH 5 DAYS Performed at [facility], [address] [place], [place] [address]    Report Status 01/04/2022 FINAL  Final  Urine Culture     Status: None   Collection Time: 01/03/22  1:45 PM   Specimen: In/Out Cath Urine  Result Value Ref Range Status   Specimen Description   Final    IN/OUT CATH URINE Performed at [facility], [address], [place], [place] [address]    Special Requests   Final    NONE Performed at [facility], [address], [place],  [address]    Culture   Final    NO GROWTH Performed at [facility], [place] [address] [place],  [address]    Report Status 01/05/2022 FINAL  Final     Radiology Studies: No results found.  Scheduled Meds:  LORazepam  1 mg Intravenous Q8H   morphine CONCENTRATE  5 mg Oral Q4H   sodium chloride flush  3 mL Intravenous Q12H   Continuous Infusions:  sodium chloride     chlorproMAZINE (THORAZINE) 12.5 mg in sodium chloride 0.9 % 25 mL IVPB       LOS: 4 days   Time spent: 35  minutes  Firas Guardado Wynetta Emery, MD How to contact the [facility] Attending or Consulting provider [place] or covering provider during after hours [place], for this patient?  Check the care team in [facility] and look for a) attending/consulting TRH provider listed and b) the [facility] team listed Log into [email or web address] and use [place]'s universal password to access. If you do not have the password, please contact the hospital operator. Locate the [facility] provider you are looking for under [facility] and page to a number that you can be directly reached. If you still have difficulty reaching the provider, please page the [facility] (Director on Call) for the Hospitalists listed on amion for assistance.

## 2022-01-15 DIAGNOSIS — R627 Adult failure to thrive: Secondary | ICD-10-CM | POA: Insufficient documentation

## 2022-01-18 NOTE — Death Summary Note (Signed)
DEATH SUMMARY   Patient Details  Name: Frank Luna MRN: [id number] DOB: 1960-03-20  Admission/Discharge Information   Admit Date:  16-Jan-2022  Date of Death: Date of Death: January 20, 2022  Time of Death: Time of Death: 2348-06-10  Length of Stay: 5  Referring Physician: Graylin [place]   Reason(s) for Hospitalization  61 year old gentleman with history of type 2 diabetes, stroke, hypertension, permanent neurological deficits brought to the emergency room with decreased oral intake and obtundation.  He was not improving, unable to keep up oral intake and now remains persistently encephalopathic so he was appropriately transitioned to full comfort care and hospice. Diagnoses  Preliminary cause of death:  Secondary Diagnoses (including complications and co-morbidities):  Principal Problem:   Stroke [facility]) Active Problems:   Failure to thrive in adult   Tobacco use disorder   Essential hypertension   Cognitive impairment   Diabetes (HCC)   Acute cerebral infarction (HCC)   Thrombocytopenia (HCC)   Small vessel disease, cerebrovascular   Hypoalbuminemia due to protein-calorie malnutrition (HCC)   Uncontrolled type 2 diabetes mellitus with hyperglycemia (HCC)   Noncompliance   Benign essential HTN   Labile blood glucose   Acute respiratory failure with hypoxia (HCC)   Acute metabolic encephalopathy   Brief Hospital Course (including significant findings, care, treatment, and services provided and events leading to death)  Frank Luna is a 61 y.o. year old male who was admitted into the hospital for end of life care under GIP status and treated medically for symptom management and comfort.  He expired in hospital waiting for a hospice bed.  He expired on January 20, 2022 and pronounced at 2350.    Pertinent Labs and Studies  Significant Diagnostic Studies DG CHEST PORT 1 VIEW  Result Date: 01/04/2022 CLINICAL DATA:  [id number] with dyspnea and respiratory abnormalities. Follow-up  pneumonia and sepsis. EXAM: PORTABLE CHEST 1 VIEW COMPARISON:  Portable chest and chest CT both 01/01/2022 FINDINGS: 4:56 a.m. There is interval worsening of patchy airspace disease in the left lower lobe. There was faint peribronchovascular nodularity on CT in the right upper and middle lobes but it is not well redemonstrated radiographically if it is still present. The remaining lungs appear generally clear. There is overlying monitor wiring. Heart size and vasculature are normal and no pleural effusion is seen. There is calcification in the aortic arch. Stable mediastinum.  Osteopenia and thoracic spondylosis. IMPRESSION: 1. Interval worsening of patchy airspace disease in the left lower lobe. 2. Faint peribronchovascular nodularity on CT in the right upper and middle lobes is not well redemonstrated radiographically if it is still present. The rest of the lungs are generally clear. Electronically Signed   By: Almira Bar M.D.   On: 01/04/2022 06:03   CT CHEST WO CONTRAST  Result Date: 01/01/2022 CLINICAL DATA:  Acute hypoxic respiratory failure EXAM: CT CHEST WITHOUT CONTRAST TECHNIQUE: Multidetector CT imaging of the chest was performed following the standard protocol without IV contrast. RADIATION DOSE REDUCTION: This exam was performed according to the departmental dose-optimization program which includes automated exposure control, adjustment of the mA and/or kV according to patient size and/or use of iterative reconstruction technique. COMPARISON:  Chest radiograph dated 01/01/2022 FINDINGS: Cardiovascular: Heart is normal in size.  No pericardial effusion. No evidence thoracic aortic aneurysm. Atherosclerotic calcifications of the aortic arch. Three vessel coronary atherosclerosis. Mediastinum/Nodes: No suspicious mediastinal lymphadenopathy. Visualized thyroid is unremarkable. Lungs/Pleura: Evaluation of the lung parenchyma is constrained by respiratory motion. Peribronchovascular nodularity in  the right upper lobe, central  right middle lobe, and superior segment right lower lobe. Additional patchy left lower lobe opacity. These findings are compatible with multifocal pneumonia, although mild superimposed left lower lobe atelectasis is possible. Underlying mild centrilobular emphysematous changes are suspected, although poorly evaluated due to motion degradation. No pleural effusion or pneumothorax. Upper Abdomen: Visualized upper abdomen is grossly unremarkable, noting vascular calcifications. Musculoskeletal: Degenerative changes of the visualized thoracolumbar spine. IMPRESSION: Motion degraded images. Multifocal pneumonia, left lower lobe predominant. Aortic Atherosclerosis (ICD10-I70.0) and Emphysema (ICD10-J43.9). Electronically Signed   By: Charline Bills M.D.   On: 01/01/2022 23:11   CT Head Wo Contrast  Result Date: 01/01/2022 CLINICAL DATA:  Delirium EXAM: CT HEAD WITHOUT CONTRAST TECHNIQUE: Contiguous axial images were obtained from the base of the skull through the vertex without intravenous contrast. RADIATION DOSE REDUCTION: This exam was performed according to the departmental dose-optimization program which includes automated exposure control, adjustment of the mA and/or kV according to patient size and/or use of iterative reconstruction technique. COMPARISON:  08/30/2020 FINDINGS: Brain: Normal anatomic configuration. Parenchymal volume loss is commensurate with the patient's age. Moderate periventricular white matter changes are present likely reflecting the sequela of small vessel ischemia. Remote lacunar infarcts within the carina radiology and thalami bilaterally are identified with remote appearing infarcts within the left thalamus and posterior left corona radiata new in the interval since prior examination. No abnormal intra or extra-axial mass lesion or fluid collection. No abnormal mass effect or midline shift. No evidence of acute intracranial hemorrhage or infarct.  Ventricular size is normal. Cerebellum unremarkable. Vascular: No asymmetric hyperdense vasculature at the skull base. Skull: Intact Sinuses/Orbits: Paranasal sinuses are clear. Orbits are unremarkable. Other: Fluid is seen within the left mastoid air cells without superimposed osseous erosion. Middle ear cavities and right mastoid air cells are clear. IMPRESSION: 1. No acute intracranial hemorrhage or infarct. 2. Moderate senescent change. 3. Remote appearing infarcts within the left thalamus and posterior left corona radiata, new in the interval since prior examination. 4. Left mastoid effusion. Electronically Signed   By: Helyn Numbers M.D.   On: 01/01/2022 21:22   DG Chest Port 1 View  Result Date: 01/01/2022 CLINICAL DATA:  Respiratory distress EXAM: PORTABLE CHEST 1 VIEW COMPARISON:  02/14/2018 FINDINGS: Mild bronchitic changes. No acute airspace disease, pleural effusion or pneumothorax. Stable cardiomediastinal silhouette. Aortic atherosclerosis IMPRESSION: Bronchitic changes without focal airspace disease Electronically Signed   By: Jasmine Pang M.D.   On: 01/01/2022 18:40    Microbiology No results found for this or any previous visit (from the past 240 hour(s)).  Lab Basic Metabolic Panel: No results for input(s): "NA", "K", "CL", "CO2", "GLUCOSE", "BUN", "CREATININE", "CALCIUM", "MG", "PHOS" in the last 168 hours. Liver Function Tests: No results for input(s): "AST", "ALT", "ALKPHOS", "BILITOT", "PROT", "ALBUMIN" in the last 168 hours. No results for input(s): "LIPASE", "AMYLASE" in the last 168 hours. No results for input(s): "AMMONIA" in the last 168 hours. CBC: No results for input(s): "WBC", "NEUTROABS", "HGB", "HCT", "MCV", "PLT" in the last 168 hours. Cardiac Enzymes: No results for input(s): "CKTOTAL", "CKMB", "CKMBINDEX", "TROPONINI" in the last 168 hours. Sepsis Labs: No results for input(s): "PROCALCITON", "WBC", "LATICACIDVEN" in the last 168  hours.  Procedures/Operations   [place] Mullett 01/15/2022, 2:26 PM

## 2022-01-18 NOTE — Progress Notes (Addendum)
Family present at bedside. Family called to notify staff  that patient had stopped breathing.  Staff to bedside and found patient pulseless and with no respirations. Time was 2350 on 01/01/2022. MD notified.

## 2022-01-18 DEATH — deceased

## 2022-07-16 ENCOUNTER — Other Ambulatory Visit: Payer: Self-pay

## 2023-06-03 IMAGING — MR MR HEAD W/O CM
6 of 11 series · 24 of 48 positions shown · non-contrast
Comparison: Prior CT from earlier same day and MRI from 06/25/2020.

CLINICAL DATA: Initial evaluation for neuro deficit, stroke
suspected.

EXAM:
MRI HEAD WITHOUT CONTRAST
TECHNIQUE: Multiplanar, multiecho pulse sequences of the brain and surrounding
structures were obtained without intravenous contrast.

[Series 3: DWI · axial · 3.0mm · 0.94mm/px · z∈[-30,+128]mm · 7 of 108 slices shown (1 of 2)]
[im 1/108]
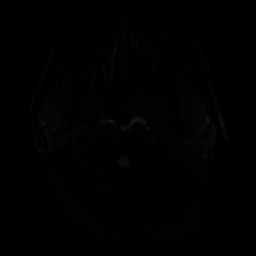
[im 18/108]
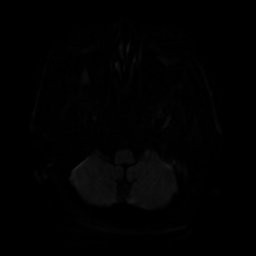
[im 36/108]
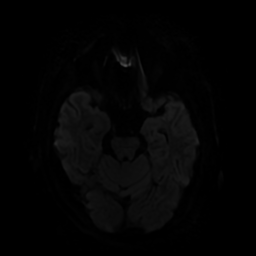
[im 54/108]
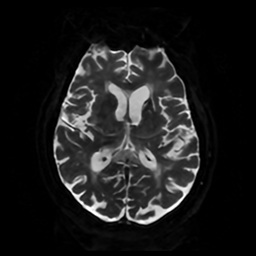
[im 72/108]
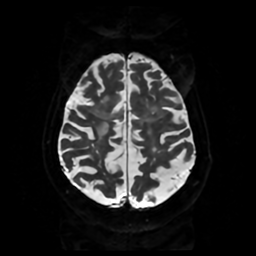
[im 90/108]
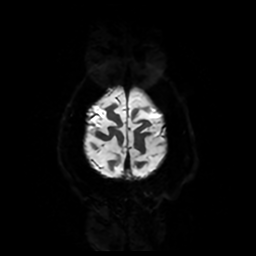
[im 108/108]
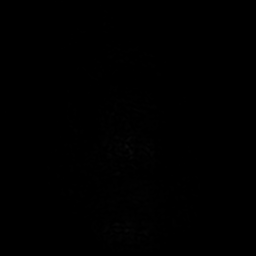

[Series 4: DWI · coronal · 4.0mm · 0.94mm/px · 5 of 78 slices shown (2 of 2)]
[im 1/78]
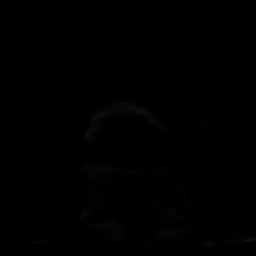
[im 20/78]
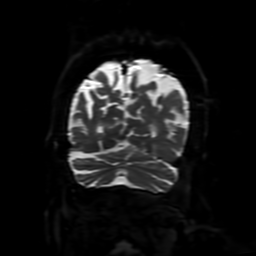
[im 39/78]
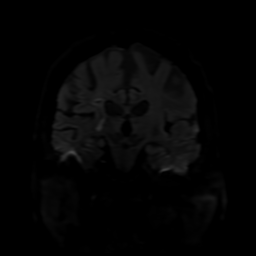
[im 58/78]
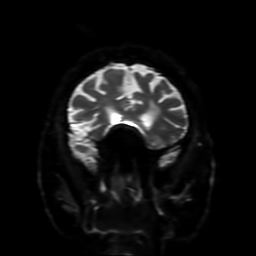
[im 78/78]
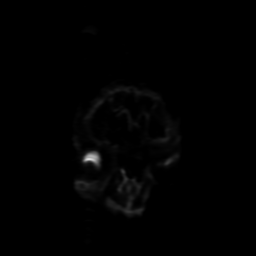

[Series 5: FLAIR · sagittal · 5.0mm · 0.23mm/px · 2 of 29 slices shown (1 of 2)]
[im 1/29]
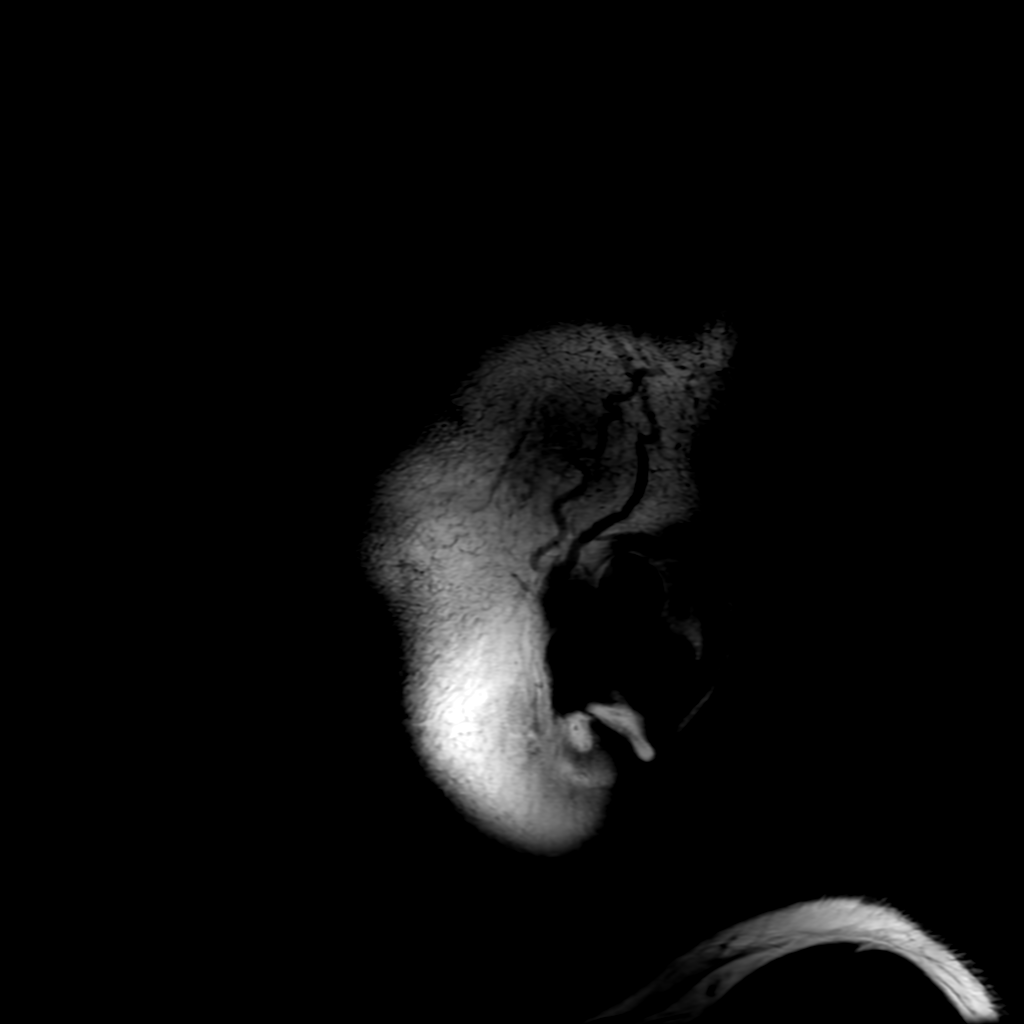
[im 29/29]
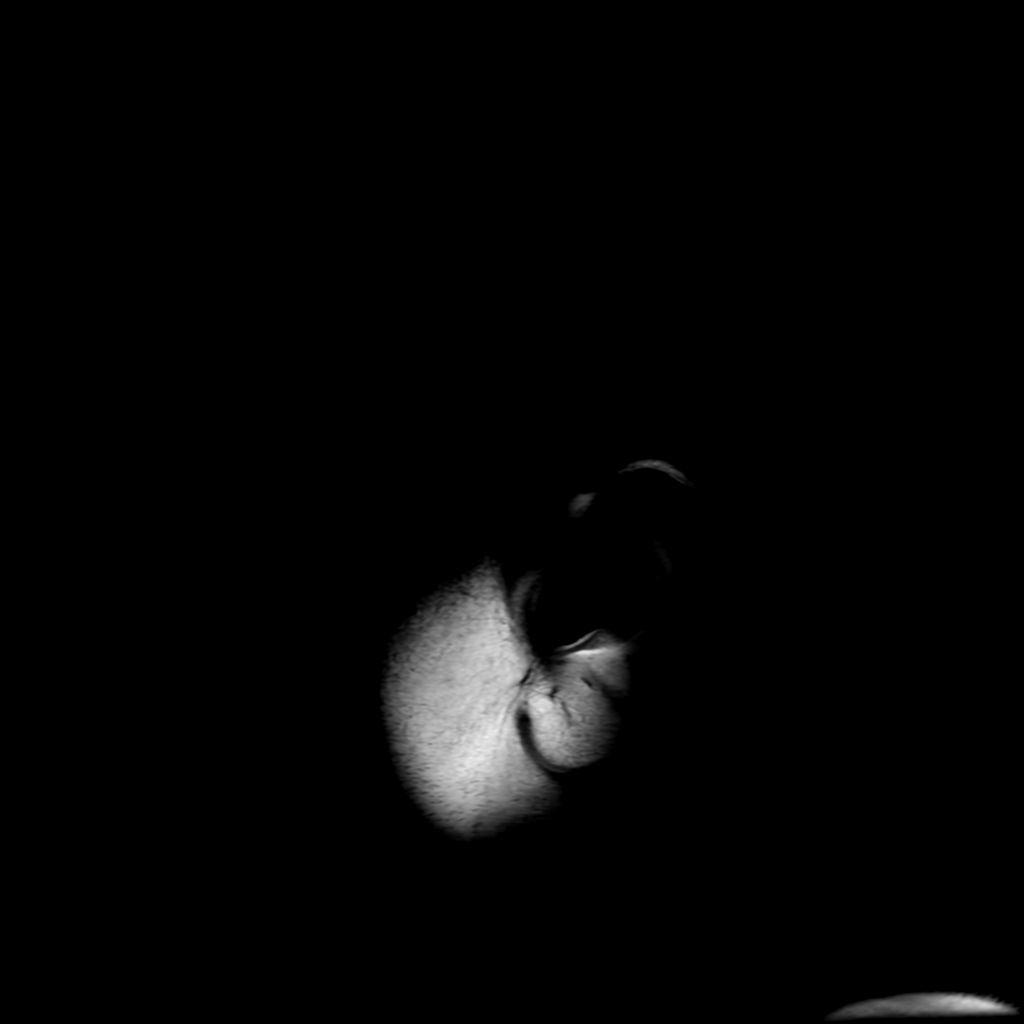

[Series 8: FLAIR · axial · 4.0mm · 0.45mm/px · z∈[-33,+126]mm · 3 of 38 slices shown (2 of 2)]
[im 1/38]
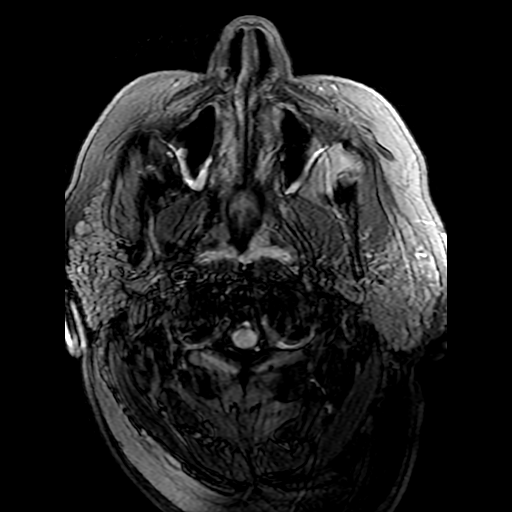
[im 19/38]
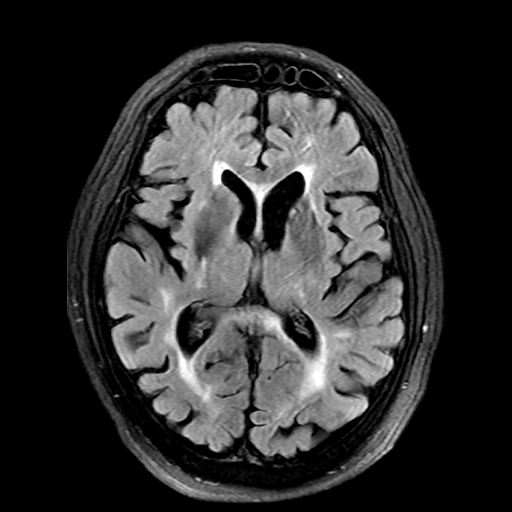
[im 38/38]
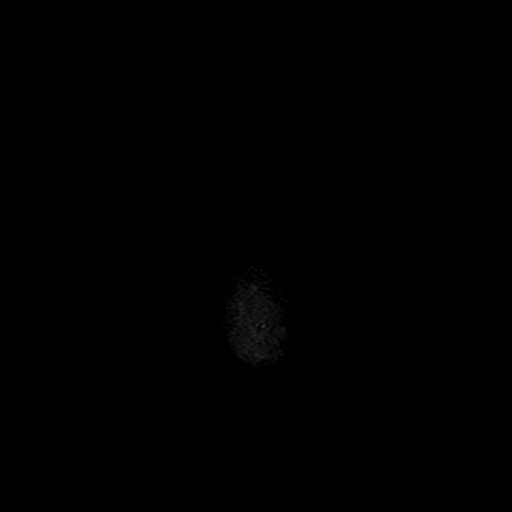

[Series 350: ADC · axial · 3.0mm · 0.94mm/px · z∈[-30,+128]mm · 4 of 55 slices shown (1 of 2)]
[im 1/55]
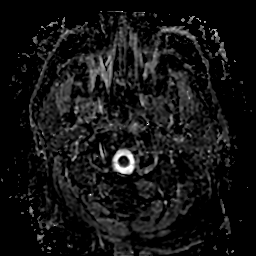
[im 19/55]
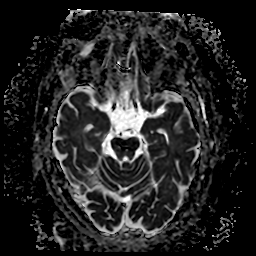
[im 37/55]
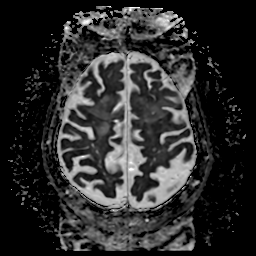
[im 55/55]
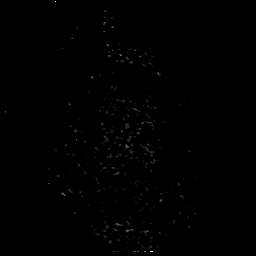

[Series 450: ADC · coronal · 4.0mm · 0.94mm/px · 3 of 38 slices shown (2 of 2)]
[im 1/38]
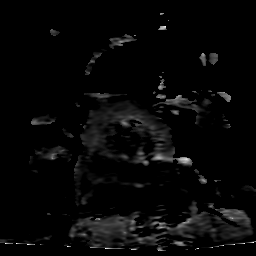
[im 19/38]
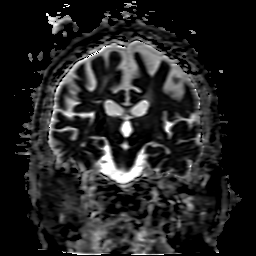
[im 38/38]
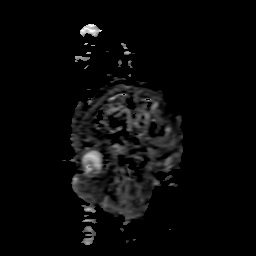

[24 of 48 positions shown; findings below may reference images not displayed]

FINDINGS: Brain: Examination degraded by motion artifact.

Diffuse prominence of the CSF containing spaces compatible
generalized age-related cerebral atrophy. Patchy and confluent
T2/FLAIR hyperintensity within the periventricular and deep white
matter both cerebral hemispheres most consistent with chronic small
vessel ischemic disease, fairly advanced in nature. Multiple
scattered superimposed remote lacunar infarcts present about the
bilateral basal ganglia, hemispheric cerebral white matter, and
pons.

There has been interval evolution of previously identified ischemic
infarcts involving the right corona radiata and subcortical left
frontal lobe, now late subacute to chronic in appearance with mild
residual diffusion abnormality. There is an additional 6 mm subacute
to chronic appearing white matter infarct involving the posterior
left frontoparietal centrum semi ovale, new as compared to most
recent MRI (series 3, image 37).

1.3 cm acute ischemic linear infarct seen involving the posterior
limb of the right internal capsule (series 3, image 26). Additional
1.6 cm linear acute ischemic infarct involving the overlying
posterior right frontoparietal region (series 3, image 29). No
associated hemorrhage or mass effect about these acute infarcts. No
other evidence for acute ischemia. Gray-white matter differentiation
otherwise maintained. No acute intracranial hemorrhage. Few small
chronic micro hemorrhages noted about the deep gray nuclei, likely
small vessel/hypertensive in nature.

No mass lesion, midline shift or mass effect. No hydrocephalus or
extra-axial fluid collection. Pituitary gland and suprasellar region
within normal limits. Midline structures intact.

Vascular: Major intracranial vascular flow voids are maintained.

Skull and upper cervical spine: Craniocervical junction within
normal limits. Bone marrow signal intensity normal. No scalp soft
tissue abnormality.

Sinuses/Orbits: Globes and orbital soft tissues demonstrate no acute
finding. Mild scattered mucosal thickening noted throughout the
paranasal sinuses. Bilateral mastoid effusions noted, stable from
prior.

Other: None.
IMPRESSION: 1. 1.3 cm acute ischemic nonhemorrhagic infarct involving the
posterior limb of the right internal capsule.
2. Additional 1.6 cm acute ischemic nonhemorrhagic infarct involving
the overlying subcortical posterior right frontoparietal region.
3. Interval evolution of previously identified ischemic infarcts
involving the right corona radiata and subcortical left frontal
lobe, now late subacute to chronic in appearance. Additional
evolving 6 mm subacute ischemic infarct at the posterior left
centrum semi ovale, subacute in nature, but new as compared to most
recent MRI from 06/25/2020.
[DATE]. Underlying age-related cerebral atrophy with advanced chronic
microvascular ischemic disease.

## 2023-11-13 ENCOUNTER — Other Ambulatory Visit: Payer: Self-pay
